# Patient Record
Sex: Male | Born: 1975 | Race: White | Hispanic: No | Marital: Married | Smoking: Never smoker
Health system: Southern US, Community
[De-identification: ages and names within clinical notes are randomized; demographics above are authoritative.]

## PROBLEM LIST (undated history)

## (undated) DIAGNOSIS — J189 Pneumonia, unspecified organism: Secondary | ICD-10-CM

## (undated) DIAGNOSIS — I10 Essential (primary) hypertension: Secondary | ICD-10-CM

## (undated) DIAGNOSIS — I509 Heart failure, unspecified: Secondary | ICD-10-CM

## (undated) DIAGNOSIS — K5790 Diverticulosis of intestine, part unspecified, without perforation or abscess without bleeding: Secondary | ICD-10-CM

## (undated) DIAGNOSIS — I429 Cardiomyopathy, unspecified: Secondary | ICD-10-CM

## (undated) DIAGNOSIS — K8020 Calculus of gallbladder without cholecystitis without obstruction: Secondary | ICD-10-CM

## (undated) DIAGNOSIS — G4733 Obstructive sleep apnea (adult) (pediatric): Secondary | ICD-10-CM

## (undated) DIAGNOSIS — I5022 Chronic systolic (congestive) heart failure: Secondary | ICD-10-CM

## (undated) DIAGNOSIS — R911 Solitary pulmonary nodule: Secondary | ICD-10-CM

## (undated) DIAGNOSIS — K760 Fatty (change of) liver, not elsewhere classified: Secondary | ICD-10-CM

## (undated) HISTORY — PX: NO PAST SURGERIES: SHX2092

---

## 2014-10-07 ENCOUNTER — Encounter (HOSPITAL_COMMUNITY): Payer: Self-pay | Admitting: Emergency Medicine

## 2014-10-07 ENCOUNTER — Emergency Department (HOSPITAL_COMMUNITY): Payer: Medicaid Other

## 2014-10-07 ENCOUNTER — Inpatient Hospital Stay (HOSPITAL_COMMUNITY)
Admission: EM | Admit: 2014-10-07 | Discharge: 2014-10-12 | DRG: 193 | Disposition: A | Discharge status: Home / Self Care | Payer: Medicaid Other | Attending: Family Medicine | Admitting: Family Medicine

## 2014-10-07 DIAGNOSIS — E1159 Type 2 diabetes mellitus with other circulatory complications: Secondary | ICD-10-CM | POA: Diagnosis present

## 2014-10-07 DIAGNOSIS — F17220 Nicotine dependence, chewing tobacco, uncomplicated: Secondary | ICD-10-CM | POA: Diagnosis present

## 2014-10-07 DIAGNOSIS — R05 Cough: Secondary | ICD-10-CM | POA: Diagnosis present

## 2014-10-07 DIAGNOSIS — J9601 Acute respiratory failure with hypoxia: Secondary | ICD-10-CM

## 2014-10-07 DIAGNOSIS — I5023 Acute on chronic systolic (congestive) heart failure: Secondary | ICD-10-CM | POA: Diagnosis present

## 2014-10-07 DIAGNOSIS — R042 Hemoptysis: Secondary | ICD-10-CM

## 2014-10-07 DIAGNOSIS — J189 Pneumonia, unspecified organism: Secondary | ICD-10-CM | POA: Diagnosis not present

## 2014-10-07 DIAGNOSIS — R0902 Hypoxemia: Secondary | ICD-10-CM

## 2014-10-07 DIAGNOSIS — E876 Hypokalemia: Secondary | ICD-10-CM | POA: Diagnosis present

## 2014-10-07 DIAGNOSIS — Z6841 Body Mass Index (BMI) 40.0 and over, adult: Secondary | ICD-10-CM | POA: Diagnosis not present

## 2014-10-07 DIAGNOSIS — D649 Anemia, unspecified: Secondary | ICD-10-CM | POA: Diagnosis present

## 2014-10-07 DIAGNOSIS — I429 Cardiomyopathy, unspecified: Secondary | ICD-10-CM | POA: Diagnosis present

## 2014-10-07 DIAGNOSIS — D509 Iron deficiency anemia, unspecified: Secondary | ICD-10-CM | POA: Diagnosis present

## 2014-10-07 DIAGNOSIS — R000 Tachycardia, unspecified: Secondary | ICD-10-CM | POA: Diagnosis present

## 2014-10-07 DIAGNOSIS — E669 Obesity, unspecified: Secondary | ICD-10-CM | POA: Diagnosis present

## 2014-10-07 DIAGNOSIS — I5021 Acute systolic (congestive) heart failure: Secondary | ICD-10-CM | POA: Diagnosis present

## 2014-10-07 DIAGNOSIS — R0602 Shortness of breath: Secondary | ICD-10-CM

## 2014-10-07 DIAGNOSIS — J9600 Acute respiratory failure, unspecified whether with hypoxia or hypercapnia: Secondary | ICD-10-CM | POA: Diagnosis not present

## 2014-10-07 DIAGNOSIS — I10 Essential (primary) hypertension: Secondary | ICD-10-CM | POA: Diagnosis present

## 2014-10-07 DIAGNOSIS — R059 Cough, unspecified: Secondary | ICD-10-CM

## 2014-10-07 HISTORY — DX: Pneumonia, unspecified organism: J18.9

## 2014-10-07 HISTORY — DX: Cardiomyopathy, unspecified: I42.9

## 2014-10-07 HISTORY — DX: Essential (primary) hypertension: I10

## 2014-10-07 LAB — CREATININE, SERUM
CREATININE: 1.03 mg/dL (ref 0.61–1.24)
GFR calc Af Amer: >60 mL/min (ref >60)

## 2014-10-07 LAB — CBC WITH DIFFERENTIAL/PLATELET
Basophils Absolute: 0 10*3/uL (ref 0.0–0.1)
Basophils Relative: 0 % (ref 0–1)
EOS PCT: 0 % (ref 0–5)
Eosinophils Absolute: 0 10*3/uL (ref 0.0–0.7)
HCT: 40.4 % (ref 39.0–52.0)
HEMOGLOBIN: 13.3 g/dL (ref 13.0–17.0)
LYMPHS PCT: 5 % — AB (ref 12–46)
Lymphs Abs: 0.6 10*3/uL — ABNORMAL LOW (ref 0.7–4.0)
MCH: 25.6 pg — ABNORMAL LOW (ref 26.0–34.0)
MCHC: 32.9 g/dL (ref 30.0–36.0)
MCV: 77.7 fL — ABNORMAL LOW (ref 78.0–100.0)
Monocytes Absolute: 0.4 10*3/uL (ref 0.1–1.0)
Monocytes Relative: 3 % (ref 3–12)
Neutro Abs: 13 10*3/uL — ABNORMAL HIGH (ref 1.7–7.7)
Neutrophils Relative %: 92 % — ABNORMAL HIGH (ref 43–77)
PLATELETS: 249 10*3/uL (ref 150–400)
RBC: 5.2 MIL/uL (ref 4.22–5.81)
RDW: 15 % (ref 11.5–15.5)
WBC: 14 10*3/uL — AB (ref 4.0–10.5)

## 2014-10-07 LAB — CBC
HEMATOCRIT: 38.5 % — AB (ref 39.0–52.0)
HEMOGLOBIN: 12.5 g/dL — AB (ref 13.0–17.0)
MCH: 25.3 pg — ABNORMAL LOW (ref 26.0–34.0)
MCHC: 32.5 g/dL (ref 30.0–36.0)
MCV: 77.8 fL — ABNORMAL LOW (ref 78.0–100.0)
Platelets: 255 10*3/uL (ref 150–400)
RBC: 4.95 MIL/uL (ref 4.22–5.81)
RDW: 15.2 % (ref 11.5–15.5)
WBC: 12.9 10*3/uL — ABNORMAL HIGH (ref 4.0–10.5)

## 2014-10-07 LAB — COMPREHENSIVE METABOLIC PANEL
ALBUMIN: 3.7 g/dL (ref 3.5–5.0)
ALT: 32 U/L (ref 17–63)
AST: 24 U/L (ref 15–41)
Alkaline Phosphatase: 60 U/L (ref 38–126)
Anion gap: 10 (ref 5–15)
BUN: 14 mg/dL (ref 6–20)
CALCIUM: 8.6 mg/dL — AB (ref 8.9–10.3)
CHLORIDE: 103 mmol/L (ref 101–111)
CO2: 24 mmol/L (ref 22–32)
Creatinine, Ser: 0.98 mg/dL (ref 0.61–1.24)
GFR calc Af Amer: >60 mL/min (ref >60)
Glucose, Bld: 101 mg/dL — ABNORMAL HIGH (ref 65–99)
POTASSIUM: 3 mmol/L — AB (ref 3.5–5.1)
Sodium: 137 mmol/L (ref 135–145)
Total Bilirubin: 1.2 mg/dL (ref 0.3–1.2)
Total Protein: 7.2 g/dL (ref 6.5–8.1)

## 2014-10-07 LAB — BRAIN NATRIURETIC PEPTIDE: B Natriuretic Peptide: 204 pg/mL — ABNORMAL HIGH (ref 0.0–100.0)

## 2014-10-07 LAB — CG4 I-STAT (LACTIC ACID): Lactic Acid, Venous: 0.84 mmol/L (ref 0.5–2.0)

## 2014-10-07 LAB — I-STAT CG4 LACTIC ACID, ED: Lactic Acid, Venous: 0.79 mmol/L (ref 0.5–2.0)

## 2014-10-07 MED ORDER — POTASSIUM CHLORIDE CRYS ER 20 MEQ PO TBCR
40.0000 meq | EXTENDED_RELEASE_TABLET | Freq: Once | ORAL | Status: AC
  Administered 2014-10-07: 40 meq via ORAL
  Filled 2014-10-07: qty 2

## 2014-10-07 MED ORDER — SODIUM CHLORIDE 0.9 % IV BOLUS (SEPSIS)
1000.0000 mL | Freq: Once | INTRAVENOUS | Status: AC
  Administered 2014-10-07: 1000 mL via INTRAVENOUS

## 2014-10-07 MED ORDER — OXYCODONE-ACETAMINOPHEN 5-325 MG PO TABS
2.0000 | ORAL_TABLET | Freq: Once | ORAL | Status: AC
  Administered 2014-10-07: 2 via ORAL
  Filled 2014-10-07: qty 2

## 2014-10-07 MED ORDER — VANCOMYCIN HCL 10 G IV SOLR
1500.0000 mg | Freq: Once | INTRAVENOUS | Status: DC
  Filled 2014-10-07: qty 1500

## 2014-10-07 MED ORDER — IPRATROPIUM-ALBUTEROL 0.5-2.5 (3) MG/3ML IN SOLN
3.0000 mL | RESPIRATORY_TRACT | Status: DC | PRN
  Administered 2014-10-07 – 2014-10-11 (×4): 3 mL via RESPIRATORY_TRACT
  Filled 2014-10-07 (×3): qty 3

## 2014-10-07 MED ORDER — ONDANSETRON HCL 4 MG/2ML IJ SOLN
4.0000 mg | Freq: Once | INTRAMUSCULAR | Status: AC
  Administered 2014-10-07: 4 mg via INTRAVENOUS
  Filled 2014-10-07: qty 2

## 2014-10-07 MED ORDER — IOHEXOL 350 MG/ML SOLN
100.0000 mL | Freq: Once | INTRAVENOUS | Status: AC | PRN
  Administered 2014-10-07: 100 mL via INTRAVENOUS

## 2014-10-07 MED ORDER — OXYCODONE-ACETAMINOPHEN 5-325 MG PO TABS
1.0000 | ORAL_TABLET | ORAL | Status: DC | PRN
Start: 2014-10-07 — End: 2014-10-09
  Administered 2014-10-08 – 2014-10-09 (×6): 1 via ORAL
  Filled 2014-10-07 (×6): qty 1

## 2014-10-07 MED ORDER — SODIUM CHLORIDE 0.9 % IV SOLN
1500.0000 mg | Freq: Once | INTRAVENOUS | Status: DC
  Filled 2014-10-07: qty 1500

## 2014-10-07 MED ORDER — VANCOMYCIN HCL IN DEXTROSE 1-5 GM/200ML-% IV SOLN: 1000.0000 mg | Freq: Once | INTRAVENOUS | Status: DC

## 2014-10-07 MED ORDER — DEXTROSE 5 % IV SOLN
1.0000 g | Freq: Once | INTRAVENOUS | Status: AC
  Administered 2014-10-07: 1 g via INTRAVENOUS
  Filled 2014-10-07: qty 10

## 2014-10-07 MED ORDER — DEXTROSE 5 % IV SOLN
500.0000 mg | Freq: Once | INTRAVENOUS | Status: AC
  Administered 2014-10-07: 500 mg via INTRAVENOUS
  Filled 2014-10-07: qty 500

## 2014-10-07 MED ORDER — DEXTROSE 5 % IV SOLN
500.0000 mg | INTRAVENOUS | Status: DC
  Administered 2014-10-08: 500 mg via INTRAVENOUS
  Filled 2014-10-07 (×2): qty 500

## 2014-10-07 MED ORDER — ZOLPIDEM TARTRATE 5 MG PO TABS
5.0000 mg | ORAL_TABLET | Freq: Every evening | ORAL | Status: DC | PRN
  Administered 2014-10-07 – 2014-10-10 (×2): 5 mg via ORAL
  Filled 2014-10-07 (×2): qty 1

## 2014-10-07 MED ORDER — SODIUM CHLORIDE 0.9 % IV SOLN
INTRAVENOUS | Status: DC
  Administered 2014-10-07 – 2014-10-09 (×4): via INTRAVENOUS

## 2014-10-07 MED ORDER — HEPARIN SODIUM (PORCINE) 5000 UNIT/ML IJ SOLN
5000.0000 [IU] | Freq: Three times a day (TID) | INTRAMUSCULAR | Status: DC
  Administered 2014-10-07 – 2014-10-12 (×14): 5000 [IU] via SUBCUTANEOUS
  Filled 2014-10-07 (×13): qty 1

## 2014-10-07 MED ORDER — IPRATROPIUM-ALBUTEROL 0.5-2.5 (3) MG/3ML IN SOLN
3.0000 mL | Freq: Once | RESPIRATORY_TRACT | Status: AC
  Administered 2014-10-07: 3 mL via RESPIRATORY_TRACT
  Filled 2014-10-07: qty 3

## 2014-10-07 MED ORDER — SODIUM CHLORIDE 0.9 % IV SOLN: INTRAVENOUS | Status: DC

## 2014-10-07 MED ORDER — DEXTROSE 5 % IV SOLN
1.0000 g | INTRAVENOUS | Status: DC
  Administered 2014-10-08 – 2014-10-11 (×4): 1 g via INTRAVENOUS
  Filled 2014-10-07 (×6): qty 10

## 2014-10-07 NOTE — ED Notes (Signed)
Hospitalist at bedside 

## 2014-10-07 NOTE — ED Notes (Signed)
Verified [facility]com[facility] with [facility]. [facility] reported okay to admin vancomycin.

## 2014-10-07 NOTE — ED Notes (Signed)
Pt reports cough and congestion x 1 1/2 months, worsening over the past week. Pt states he was up all last night coughing up blood, bright red.

## 2014-10-07 NOTE — ED Notes (Signed)
EDP at bedside  

## 2014-10-07 NOTE — Progress Notes (Signed)
Pt complaining of headache, toothache, and generalized pain. Notified MD (Dr. Clearence Ped). Awaiting response.

## 2014-10-07 NOTE — ED Provider Notes (Signed)
16:15- has asthma Dr. Deretha Emory to evaluate the patient after return of CT imaging, CT angiogram, chest. CT is abnormal and most consistent with pneumonia. The CT is abnormal, mostly consistent with pneumonia, but has a wide differential. Broad-spectrum antibodies ordered for community-acquired pneumonia. Oxygen status has been stabilized with nasal cannula oxygen. Hypokalemia, treated with oral potassium.  Assessment- symptoms, cough with hemoptysis and hypoxia, are most consistent with an infectious process. Patient works in a [employer], and is exposed to various chemicals and toxic products. He will require admission for stabilization.  4:26 PM-Consult complete with Hospitalist. Patient case explained and discussed. He agrees to admit patient for further evaluation and treatment. Call ended at 16:46  CRITICAL CARE Performed by: Flint Melter Total critical care time: 35 minutes Critical care time was exclusive of separately billable procedures and treating other patients. Critical care was necessary to treat or prevent imminent or life-threatening deterioration. Critical care was time spent personally by me on the following activities: development of treatment plan with patient and/or surrogate as well as nursing, discussions with consultants, evaluation of patient's response to treatment, examination of patient, obtaining history from patient or surrogate, ordering and performing treatments and interventions, ordering and review of laboratory studies, ordering and review of radiographic studies, pulse oximetry and re-evaluation of patient's condition.  Medications  0.9 %  sodium chloride infusion (not administered)  cefTRIAXone (ROCEPHIN) 1 g in dextrose 5 % 50 mL IVPB (not administered)  azithromycin (ZITHROMAX) 500 mg in dextrose 5 % 250 mL IVPB (not administered)  sodium chloride 0.9 % bolus 1,000 mL (0 mLs Intravenous Stopped 10/07/14 1534)  ondansetron (ZOFRAN) injection 4 mg  (4 mg Intravenous Given 10/07/14 1256)  ipratropium-albuterol (DUONEB) 0.5-2.5 (3) MG/3ML nebulizer solution 3 mL (3 mLs Nebulization Given 10/07/14 1329)  iohexol (OMNIPAQUE) 350 MG/ML injection 100 mL (100 mLs Intravenous Contrast Given 10/07/14 1455)  sodium chloride 0.9 % bolus 1,000 mL (1,000 mLs Intravenous New Bag/Given 10/07/14 1538)  potassium chloride SA (K-DUR,KLOR-CON) CR tablet 40 mEq (40 mEq Oral Given 10/07/14 1534)    Patient Vitals for the past 24 hrs:  BP Temp Temp src Pulse Resp SpO2 Height Weight  10/07/14 1615 - - - (!) 123 (!) 30 92 % - -  10/07/14 1600 138/92 mmHg - - (!) 122 (!) 37 91 % - -  10/07/14 1530 127/91 mmHg - - (!) 121 (!) 27 92 % - -  10/07/14 1500 154/96 mmHg - - (!) 124 (!) 29 92 % - -  10/07/14 1430 143/92 mmHg - - 119 (!) 43 93 % - -  10/07/14 1412 - - - - - - - (!) 355 lb (161.027 kg)  10/07/14 1400 144/96 mmHg - - (!) 122 22 92 % - -  10/07/14 1345 - - - (!) 121 (!) 33 94 % - -  10/07/14 1330 (!) 160/103 mmHg - - 118 21 100 % - -  10/07/14 1321 156/98 mmHg - - 118 (!) 28 94 % - -  10/07/14 1315 - - - 118 - 95 % - -  10/07/14 1300 (!) 166/108 mmHg - - - - - - -  10/07/14 1236 (!) 168/121 mmHg - - 120 23 92 % - -  10/07/14 1220 156/97 mmHg 99.4 F (37.4 C) Oral (!) 123 (!) 28 92 % 6\' 3"  (1.905 m) (!) 350 lb (158.759 kg)               Final diagnoses:  Cough  SOB (shortness  of breath)  Hemoptysis  Hypoxia  Community acquired pneumonia  Hypokalemia    Mancel Bale, MD 10/07/14 1649

## 2014-10-07 NOTE — H&P (Signed)
[facility] History and Physical  Cole Singleton ZOX:[id number] DOB: [birth year] DOA: 10/07/2014  Referring physician: ER PCP: No primary care provider on file.   Chief Complaint: Cough. Dyspnea.  HPI: Cole Singleton is a 39 y.o. male  This is a 39 year old morbidly obese man who presents with 3-4 week history of cough and congestion. He says that he does have allergies and he tends to cough and sneeze but in the last 24 hours he has been coughing up blood together with yellow tinged sputum. He denies any subjective fever. He says that his breathing is worse than usual. He is rather vague with what makes him short of breath. He denies any nausea, vomiting, abdominal pain, diarrhea, neurological symptoms. 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 Evaluation in the emergency room shows bilateral pneumonia. He is now being admitted for further management.   Review of Systems:  Apart from symptoms above, all systems negative.  Past Medical History  Diagnosis Date  . Cardiomyopathy   . Hypertension    History reviewed. No pertinent past surgical history. Social History:  reports that he has never smoked. His smokeless tobacco use includes Chew. He reports that he drinks alcohol. His drug history is not on file.  Allergies  Allergen Reactions  . Codeine Other (See Comments)     Family history: There is no family history of lung disease.   Prior to Admission medications   Medication Sig Start Date End Date Taking? Authorizing Provider  ibuprofen (ADVIL,MOTRIN) 200 MG tablet Take 800 mg by mouth every 6 (six) hours as needed for moderate pain.   Yes Historical Provider, MD  Pseudoeph-Doxylamine-DM-APAP (DAYQUIL/NYQUIL COLD/FLU RELIEF PO) Take 2 capsules by mouth every 4 (four) hours as needed (sinus congestion).   Yes Historical Provider, MD   Physical Exam: Filed Vitals:   10/07/14 1700 10/07/14 1715 10/07/14 1730 10/07/14 1751    BP: 137/90  141/86   Pulse: 121 121 120 120  Temp:      TempSrc:      Resp:  24  27  Height:      Weight:      SpO2: 91%  92% 93%    Wt Readings from Last 3 Encounters:  10/07/14 161.027 kg (355 lb)    General:  Appears calm and comfortable. Nontoxic. He appears to be somewhat dyspneic whilst he is talking to me. However, there is no peripheral or central cyanosis. He feels warm. Eyes: PERRL, normal lids, irises & conjunctiva ENT: grossly normal hearing, lips & tongue Neck: no LAD, masses or thyromegaly Cardiovascular: RRR, no m/r/g. No LE edema. Telemetry: SR, no arrhythmias  Respiratory: Scattered bilateral crackles. There is no bronchial breathing or wheezing today.. Abdomen: soft, ntnd Skin: no rash or induration seen on limited exam Musculoskeletal: grossly normal tone BUE/BLE Psychiatric: grossly normal mood and affect, speech fluent and appropriate Neurologic: grossly non-focal.          Labs on Admission:  Basic Metabolic Panel:  Recent Labs Lab 10/07/14 1248  NA 137  K 3.0*  CL 103  CO2 24  GLUCOSE 101*  BUN 14  CREATININE 0.98  CALCIUM 8.6*   Liver Function Tests:  Recent Labs Lab 10/07/14 1248  AST 24  ALT 32  ALKPHOS 60  BILITOT 1.2  PROT 7.2  ALBUMIN 3.7   No results for input(s): LIPASE, AMYLASE in the last 168 hours. No results for input(s): AMMONIA in the last 168 hours. CBC:  Recent  Labs Lab 10/07/14 1248  WBC 14.0*  NEUTROABS 13.0*  HGB 13.3  HCT 40.4  MCV 77.7*  PLT 249   Cardiac Enzymes: No results for input(s): CKTOTAL, CKMB, CKMBINDEX, TROPONINI in the last 168 hours.  BNP (last 3 results)  Recent Labs  10/07/14 1535  BNP 204.0*    ProBNP (last 3 results) No results for input(s): PROBNP in the last 8760 hours.  CBG: No results for input(s): GLUCAP in the last 168 hours.  Radiological Exams on Admission: Dg Chest 2 View  10/07/2014   CLINICAL DATA:  Cough and shortness of breath  EXAM: CHEST  2 VIEW   COMPARISON:  None.  FINDINGS: Mild enlargement of the cardiomediastinal silhouette is noted. Multi lobar patchy airspace opacities are noted with central vascular congestion. No pleural effusion. No acute osseous finding.  IMPRESSION: Cardiomegaly with nonspecific patchy multi lobar airspace opacities. This could represent asymmetric edema but other alveolar filling processes including hemorrhage, infection, pus, or protein could appear similar.   Electronically Signed   By: Christiana Pellant M.D.   On: 10/07/2014 13:35   Ct Angio Chest Pe W/cm &/or Wo Cm  10/07/2014   CLINICAL DATA:  Shortness of breath and hemoptysis. History of cardiomyopathy.  EXAM: CT ANGIOGRAPHY CHEST WITH CONTRAST  TECHNIQUE: Multidetector CT imaging of the chest was performed using the standard protocol during bolus administration of intravenous contrast. Multiplanar CT image reconstructions and MIPs were obtained to evaluate the vascular anatomy.  CONTRAST:  OMNIPAQUE IOHEXOL 350 MG/ML SOLN  COMPARISON:  Chest radiograph October 07, 2014  FINDINGS: There is no demonstrable pulmonary embolus. There is no thoracic aortic aneurysm or dissection.  There is extensive airspace consolidation throughout both lungs with essentially equivalent upper and lower lobe prominence. There is no appreciable interstitial edema. There is slight lower lobe bronchiectasis bilaterally.  Thyroid appears normal. There are small mediastinal lymph nodes but no adenopathy by size criteria. The pericardium is not thickened.  In the visualized upper abdomen, there is hepatic steatosis. Both kidneys show fetal lobulations, an anatomic variant.  There are no blastic or lytic bone lesions.  Review of the MIP images confirms the above findings.  IMPRESSION: No demonstrable pulmonary embolus.  Widespread airspace opacity bilaterally. Major differential considerations include extensive infectious pneumonia, pulmonary hemorrhage, or extensive noncardiogenic edema such as  due to toxin exposure or medication reaction. The appearance is atypical for potential congestive heart failure.  Multiple small mediastinal lymph nodes but no frank adenopathy by size criteria.  There is hepatic steatosis.   Electronically Signed   By: Bretta Bang III M.D.   On: 10/07/2014 16:04      Assessment/Plan   1. Community-acquired pneumonia. He will be treated with anti-biotics. He'll be given supplemental oxygen as required. In view of his very abnormal chest x-ray and CT scan, I will ask pulmonary for further recommendations. He tells me that in 2009 he had pneumonia and apparently was diagnosed with cardiomyopathy at that time. He was treated with diuretics and was seeing a cardiologist, however it seems that his ejection fraction must have returned to her normal state because he was discharged from further appointments. I will check echocardiogram again on this admission. 2. Obesity.  Further recommendations will depend on patient's hospital progress.  Code Status: Full code.   DVT Prophylaxis: Heparin.  Family Communication: I discussed the plan with the patient at the bedside.   Disposition Plan: Home when medically stable.   Time spent: 60 minutes.  Cole Singleton  C [facility] Pager [phone].

## 2014-10-07 NOTE — Progress Notes (Signed)
ANTIBIOTIC CONSULT NOTE - INITIAL  Pharmacy Consult for Vancomycin Indication: pneumonia  Allergies  Allergen Reactions  . Codeine Other (See Comments)    Patient Measurements: Height: 6\' 3"  (190.5 cm) Weight: (!) 355 lb (161.027 kg) IBW/kg (Calculated) : 84.5  Vital Signs: Temp: 98.4 F (36.9 C) (06/19 1633) Temp Source: Oral (06/19 1633) BP: 144/93 mmHg (06/19 1630) Pulse Rate: 122 (06/19 1630) Intake/Output from previous day:   Intake/Output from this shift:    Labs:  Recent Labs  10/07/14 1248  WBC 14.0*  HGB 13.3  PLT 249  CREATININE 0.98   Estimated Creatinine Clearance: 164.8 mL/min (by C-G formula based on Cr of 0.98). No results for input(s): VANCOTROUGH, VANCOPEAK, VANCORANDOM, GENTTROUGH, GENTPEAK, GENTRANDOM, TOBRATROUGH, TOBRAPEAK, TOBRARND, AMIKACINPEAK, AMIKACINTROU, AMIKACIN in the last 72 hours.   Microbiology: Recent Results (from the past 720 hour(s))  Culture, blood (routine x 2)     Status: None (Preliminary result)   Collection Time: 10/07/14  2:54 PM  Result Value Ref Range Status   Specimen Description BLOOD LEFT ARM  Final   Special Requests BOTTLES DRAWN AEROBIC AND ANAEROBIC 8CC BOTTLES  Final   Culture PENDING  Incomplete   Report Status PENDING  Incomplete  Culture, blood (routine x 2)     Status: None (Preliminary result)   Collection Time: 10/07/14  2:59 PM  Result Value Ref Range Status   Specimen Description BLOOD RIGHT HAND  Final   Special Requests BOTTLES DRAWN AEROBIC ONLY 8CC BOTTLE  Final   Culture PENDING  Incomplete   Report Status PENDING  Incomplete    Medical History: Past Medical History  Diagnosis Date  . Cardiomyopathy   . Hypertension    Anti-infectives    Start     Dose/Rate Route Frequency Ordered Stop   10/07/14 1900  vancomycin (VANCOCIN) IVPB 1000 mg/200 mL premix     1,000 mg 200 mL/hr over 60 Minutes Intravenous  Once 10/07/14 1647     10/07/14 1700  vancomycin (VANCOCIN) 1,500 mg in sodium  chloride 0.9 % 500 mL IVPB  Status:  Discontinued     1,500 mg 250 mL/hr over 120 Minutes Intravenous  Once 10/07/14 1634 10/07/14 1643   10/07/14 1700  vancomycin (VANCOCIN) 1,500 mg in sodium chloride 0.9 % 500 mL IVPB     1,500 mg 250 mL/hr over 120 Minutes Intravenous  Once 10/07/14 1647     10/07/14 1630  cefTRIAXone (ROCEPHIN) 1 g in dextrose 5 % 50 mL IVPB     1 g 100 mL/hr over 30 Minutes Intravenous  Once 10/07/14 1618     10/07/14 1630  azithromycin (ZITHROMAX) 500 mg in dextrose 5 % 250 mL IVPB     500 mg 250 mL/hr over 60 Minutes Intravenous  Once 10/07/14 1618        Assessment: 39yo MORBIDLY OBESE male presents to ED with c/o congestion and cough for ~ 6 weeks.  States he started experiencing hemoptysis last night.  Also c/o wheezing, sore throat and HA.  SCr at baseline.  Normalized ClCr ~ 100-140ml/min  Goal of Therapy:  Vancomycin trough level 15-20 mcg/ml  Plan:  Vancomycin 2500mg  loading dose now x 1 then Vancomycin 1500mg  IV q8hrs Check trough at steady state Monitor labs, renal fxn, progress and c/s Deescalate ABX when appropriate  Valrie Hart A 10/07/2014,4:50 PM

## 2014-10-07 NOTE — ED Notes (Addendum)
Lab at bedside. Pt will be taken to room 330 post lab draw.

## 2014-10-07 NOTE — ED Notes (Signed)
Pt 02 saturation on room air 88-925. Pt placed on 2lites and o2 satuation 93%. nad noted.

## 2014-10-07 NOTE — ED Notes (Signed)
RT called

## 2014-10-07 NOTE — ED Provider Notes (Addendum)
CSN: [id number]     Arrival date & time 10/07/14  1211 History  This chart was scribed for Vanetta Mulders, MD by Karle Plumber, ED Scribe. This patient was seen in room APA06/[facility]06 and the patient's care was started at 12:41 PM.  Chief Complaint  Patient presents with  . Shortness of Breath   The history is provided by the patient and medical records. No language interpreter was used.    HPI Comments:  Cole Singleton is a 39 y.o. male who presents to the Emergency Department complaining of congestion and cough that began approximately six weeks ago. He states he started experiencing hemoptysis (less than 1 cup) last night. He also reports associated wheezing, mild HA and mild sore throat. He has not done anything to treat his symptoms. He reports having similar symptoms to this every year but states it is worse this time. He denies modifying factors of the symptoms. He denies fever, chills, rhinorrhea, visual disturbance, CP, leg swelling, abdominal pain, diarrhea, nausea, vomiting, dysuria, hematuria, back pain or rash.   Past Medical History  Diagnosis Date  . Cardiomyopathy   . Hypertension    History reviewed. No pertinent past surgical history. History reviewed. No pertinent family history. History  Substance Use Topics  . Smoking status: Never Smoker   . Smokeless tobacco: Current User    Types: Chew  . Alcohol Use: Yes     Comment: occas    Review of Systems  Constitutional: Negative for fever and chills.  HENT: Positive for congestion and sore throat. Negative for rhinorrhea.   Eyes: Negative for visual disturbance.  Respiratory: Positive for cough, shortness of breath and wheezing.   Cardiovascular: Negative for chest pain and leg swelling.  Gastrointestinal: Negative for nausea, vomiting, abdominal pain and diarrhea.  Genitourinary: Negative for dysuria and hematuria.  Musculoskeletal: Negative for back pain and neck pain.  Skin: Negative for rash.  Neurological:  Positive for headaches. Negative for dizziness and light-headedness.  Hematological: Does not bruise/bleed easily.  Psychiatric/Behavioral: Negative for confusion.    Allergies  Codeine  Home Medications   Prior to Admission medications   Medication Sig Start Date End Date Taking? Authorizing Provider  ibuprofen (ADVIL,MOTRIN) 200 MG tablet Take 800 mg by mouth every 6 (six) hours as needed for moderate pain.   Yes Historical Provider, MD  Pseudoeph-Doxylamine-DM-APAP (DAYQUIL/NYQUIL COLD/FLU RELIEF PO) Take 2 capsules by mouth every 4 (four) hours as needed (sinus congestion).   Yes Historical Provider, MD   Triage Vitals: BP 156/97 mmHg  Pulse 123  Temp(Src) 99.4 F (37.4 C) (Oral)  Resp 28  Ht  (1.905 m)  Wt 350 lb (158.759 kg)  BMI 43.75 kg/m2  SpO2 92% Physical Exam  Constitutional: He is oriented to person, place, and time. He appears well-developed and well-nourished.  HENT:  Head: Normocephalic and atraumatic.  Mouth/Throat: Oropharynx is clear and moist.  No blood present in back of throat.  Eyes: EOM are normal. Pupils are equal, round, and reactive to light. No scleral icterus.  Neck: Normal range of motion.  Cardiovascular: Tachycardia present.   No murmur heard. Pulmonary/Chest: Effort normal and breath sounds normal. No respiratory distress.  Abdominal: Soft. Bowel sounds are normal. There is no tenderness.  Musculoskeletal: Normal range of motion. He exhibits no edema.  Neurological: He is alert and oriented to person, place, and time. No cranial nerve deficit. He exhibits normal muscle tone. Coordination normal.  Skin: Skin is warm and dry.  Psychiatric: He has a  normal mood and affect. His behavior is normal.  Nursing note and vitals reviewed.   ED Course  Procedures (including critical care time) DIAGNOSTIC STUDIES: Oxygen Saturation is 92% on RA, low by my interpretation.   COORDINATION OF CARE: 12:45 PM- Will order labs, CXR and Zofran for  nausea. Pt verbalizes understanding and agrees to plan.  Medications  0.9 %  sodium chloride infusion (not administered)  iohexol (OMNIPAQUE) 350 MG/ML injection 100 mL (not administered)  sodium chloride 0.9 % bolus 1,000 mL (1,000 mLs Intravenous New Bag/Given 10/07/14 1256)  ondansetron (ZOFRAN) injection 4 mg (4 mg Intravenous Given 10/07/14 1256)  ipratropium-albuterol (DUONEB) 0.5-2.5 (3) MG/3ML nebulizer solution 3 mL (3 mLs Nebulization Given 10/07/14 1329)    Labs Review Labs Reviewed  COMPREHENSIVE METABOLIC PANEL - Abnormal; Notable for the following:    Potassium 3.0 (*)    Glucose, Bld 101 (*)    Calcium 8.6 (*)    All other components within normal limits  CBC WITH DIFFERENTIAL/PLATELET - Abnormal; Notable for the following:    WBC 14.0 (*)    MCV 77.7 (*)    MCH 25.6 (*)    Neutrophils Relative % 92 (*)    Neutro Abs 13.0 (*)    Lymphocytes Relative 5 (*)    Lymphs Abs 0.6 (*)    All other components within normal limits   Results for orders placed or performed during the hospital encounter of 10/07/14  Comprehensive metabolic panel  Result Value Ref Range   Sodium 137 135 - 145 mmol/L   Potassium 3.0 (L) 3.5 - 5.1 mmol/L   Chloride 103 101 - 111 mmol/L   CO2 24 22 - 32 mmol/L   Glucose, Bld 101 (H) 65 - 99 mg/dL   BUN 14 6 - 20 mg/dL   Creatinine, Ser 0.92 0.61 - 1.24 mg/dL   Calcium 8.6 (L) 8.9 - 10.3 mg/dL   Total Protein 7.2 6.5 - 8.1 g/dL   Albumin 3.7 3.5 - 5.0 g/dL   AST 24 15 - 41 U/L   ALT 32 17 - 63 U/L   Alkaline Phosphatase 60 38 - 126 U/L   Total Bilirubin 1.2 0.3 - 1.2 mg/dL   GFR calc non Af Amer >60 >60 mL/min   GFR calc Af Amer >60 >60 mL/min   Anion gap 10 5 - 15  CBC with Differential/Platelet  Result Value Ref Range   WBC 14.0 (H) 4.0 - 10.5 K/uL   RBC 5.20 4.22 - 5.81 MIL/uL   Hemoglobin 13.3 13.0 - 17.0 g/dL   HCT 95.7 47.3 - 40.3 %   MCV 77.7 (L) 78.0 - 100.0 fL   MCH 25.6 (L) 26.0 - 34.0 pg   MCHC 32.9 30.0 - 36.0 g/dL   RDW  70.9 64.3 - 83.8 %   Platelets 249 150 - 400 K/uL   Neutrophils Relative % 92 (H) 43 - 77 %   Neutro Abs 13.0 (H) 1.7 - 7.7 K/uL   Lymphocytes Relative 5 (L) 12 - 46 %   Lymphs Abs 0.6 (L) 0.7 - 4.0 K/uL   Monocytes Relative 3 3 - 12 %   Monocytes Absolute 0.4 0.1 - 1.0 K/uL   Eosinophils Relative 0 0 - 5 %   Eosinophils Absolute 0.0 0.0 - 0.7 K/uL   Basophils Relative 0 0 - 1 %   Basophils Absolute 0.0 0.0 - 0.1 K/uL     Imaging Review Dg Chest 2 View  10/07/2014   CLINICAL DATA:  Cough and shortness of breath  EXAM: CHEST  2 VIEW  COMPARISON:  None.  FINDINGS: Mild enlargement of the cardiomediastinal silhouette is noted. Multi lobar patchy airspace opacities are noted with central vascular congestion. No pleural effusion. No acute osseous finding.  IMPRESSION: Cardiomegaly with nonspecific patchy multi lobar airspace opacities. This could represent asymmetric edema but other alveolar filling processes including hemorrhage, infection, pus, or protein could appear similar.   Electronically Signed   By: Christiana Pellant M.D.   On: 10/07/2014 13:35     EKG Interpretation   Date/Time:  Sunday October 07 2014 12:31:25 EDT Ventricular Rate:  127 PR Interval:  142 QRS Duration: 94 QT Interval:  350 QTC Calculation: 509 R Axis:   69 Text Interpretation:  Sinus tachycardia Probable LVH with secondary repol  abnrm Prolonged QT interval No previous ECGs available Confirmed by  Hensley Aziz  MD, Azizah Lisle [phone]) on 10/07/2014 12:47:56 PM      MDM   Final diagnoses:  Cough  SOB (shortness of breath)  Hemoptysis    Patient with the cough and congestion for about a month and a half. Cut worse over the past week. Patient started coughing up blood last night less than a cup in total. Patient felt some wheezing associated with it.  Patient treated here with the albuterol Atrovent neb and patient feeling somewhat better. Patient's oxygen saturations have been in the low 90s. One point very  borderline so started on 2 L of nasal cannula oxygen. Patient with persistent tachycardia. No fever. Hemoglobin and hematocrit is normal. No significant blood loss.  Chest x-ray raises several concerns. There is cardiomegaly of patient's had a history of that. Is not cystic patchy multilobar airspace opacities. This could represent edema could represent alveolar filling process including hemorrhage and infection pus or protein. Patients undergo CT and geode to further delineate these findings. Also by doing a CT angiogram rule out pulmonary embolus since he is having hemoptysis and tachycardia and some hypoxia.  If patient's hypoxia does not improve. At the lowest was 88% the tachycardia does not improve he will require admission. Patient does not have a current primary care doctor.  I personally performed the services described in this documentation, which was scribed in my presence. The recorded information has been reviewed and is accurate.    Vanetta Mulders, MD 10/07/14 1453  Addendum: Due to patient's  persistent symptoms we'll go and get blood cultures lactic acid and because of the cardiomegaly a BNP. EKG has been done is consistent with sinus tachycardia. As well as prolonged QT interval.  Vanetta Mulders, MD 10/07/14 1455

## 2014-10-08 ENCOUNTER — Encounter (HOSPITAL_COMMUNITY): Payer: Self-pay | Admitting: Internal Medicine

## 2014-10-08 ENCOUNTER — Inpatient Hospital Stay (HOSPITAL_COMMUNITY): Payer: Medicaid Other

## 2014-10-08 DIAGNOSIS — R042 Hemoptysis: Secondary | ICD-10-CM | POA: Diagnosis present

## 2014-10-08 DIAGNOSIS — I10 Essential (primary) hypertension: Secondary | ICD-10-CM | POA: Diagnosis present

## 2014-10-08 DIAGNOSIS — J189 Pneumonia, unspecified organism: Secondary | ICD-10-CM | POA: Diagnosis present

## 2014-10-08 DIAGNOSIS — E1159 Type 2 diabetes mellitus with other circulatory complications: Secondary | ICD-10-CM | POA: Diagnosis present

## 2014-10-08 DIAGNOSIS — R05 Cough: Secondary | ICD-10-CM | POA: Diagnosis present

## 2014-10-08 DIAGNOSIS — R000 Tachycardia, unspecified: Secondary | ICD-10-CM | POA: Diagnosis present

## 2014-10-08 DIAGNOSIS — R059 Cough, unspecified: Secondary | ICD-10-CM | POA: Diagnosis present

## 2014-10-08 LAB — CBC
HEMATOCRIT: 37.6 % — AB (ref 39.0–52.0)
Hemoglobin: 12.1 g/dL — ABNORMAL LOW (ref 13.0–17.0)
MCH: 25.4 pg — ABNORMAL LOW (ref 26.0–34.0)
MCHC: 32.2 g/dL (ref 30.0–36.0)
MCV: 79 fL (ref 78.0–100.0)
PLATELETS: 230 10*3/uL (ref 150–400)
RBC: 4.76 MIL/uL (ref 4.22–5.81)
RDW: 15.7 % — ABNORMAL HIGH (ref 11.5–15.5)
WBC: 10.9 10*3/uL — ABNORMAL HIGH (ref 4.0–10.5)

## 2014-10-08 LAB — COMPREHENSIVE METABOLIC PANEL
ALBUMIN: 3.3 g/dL — AB (ref 3.5–5.0)
ALT: 31 U/L (ref 17–63)
AST: 24 U/L (ref 15–41)
Alkaline Phosphatase: 51 U/L (ref 38–126)
Anion gap: 9 (ref 5–15)
BUN: 14 mg/dL (ref 6–20)
CO2: 24 mmol/L (ref 22–32)
CREATININE: 1.01 mg/dL (ref 0.61–1.24)
Calcium: 8 mg/dL — ABNORMAL LOW (ref 8.9–10.3)
Chloride: 105 mmol/L (ref 101–111)
GFR calc non Af Amer: >60 mL/min (ref >60)
Glucose, Bld: 97 mg/dL (ref 65–99)
Potassium: 3.3 mmol/L — ABNORMAL LOW (ref 3.5–5.1)
Sodium: 138 mmol/L (ref 135–145)
TOTAL PROTEIN: 6.7 g/dL (ref 6.5–8.1)
Total Bilirubin: 1.2 mg/dL (ref 0.3–1.2)

## 2014-10-08 LAB — GLUCOSE, CAPILLARY: Glucose-Capillary: 98 mg/dL (ref 65–99)

## 2014-10-08 LAB — STREP PNEUMONIAE URINARY ANTIGEN: Strep Pneumo Urinary Antigen: NEGATIVE

## 2014-10-08 LAB — BRAIN NATRIURETIC PEPTIDE: B Natriuretic Peptide: 177 pg/mL — ABNORMAL HIGH (ref 0.0–100.0)

## 2014-10-08 LAB — SEDIMENTATION RATE: Sed Rate: 30 mm/hr — ABNORMAL HIGH (ref 0–16)

## 2014-10-08 LAB — MAGNESIUM: Magnesium: 1.8 mg/dL (ref 1.7–2.4)

## 2014-10-08 MED ORDER — IPRATROPIUM-ALBUTEROL 0.5-2.5 (3) MG/3ML IN SOLN
3.0000 mL | RESPIRATORY_TRACT | Status: DC
  Administered 2014-10-08 – 2014-10-11 (×15): 3 mL via RESPIRATORY_TRACT
  Filled 2014-10-08 (×16): qty 3

## 2014-10-08 MED ORDER — CARVEDILOL 3.125 MG PO TABS
3.1250 mg | ORAL_TABLET | Freq: Two times a day (BID) | ORAL | Status: DC
  Administered 2014-10-08 – 2014-10-12 (×8): 3.125 mg via ORAL
  Filled 2014-10-08 (×8): qty 1

## 2014-10-08 MED ORDER — VANCOMYCIN HCL 10 G IV SOLR
1500.0000 mg | Freq: Three times a day (TID) | INTRAVENOUS | Status: DC
  Filled 2014-10-08: qty 1500

## 2014-10-08 MED ORDER — POTASSIUM CHLORIDE CRYS ER 20 MEQ PO TBCR
40.0000 meq | EXTENDED_RELEASE_TABLET | Freq: Once | ORAL | Status: AC
  Administered 2014-10-08: 40 meq via ORAL
  Filled 2014-10-08: qty 2

## 2014-10-08 MED ORDER — GUAIFENESIN 100 MG/5ML PO SOLN
200.0000 mg | ORAL | Status: DC | PRN
  Administered 2014-10-08 – 2014-10-11 (×2): 200 mg via ORAL
  Filled 2014-10-08 (×2): qty 10

## 2014-10-08 NOTE — Progress Notes (Signed)
Nurse called and said the Pt was coughing a lot because he was laying flat and it was hard for him to breath. The Pt SATS were 79% on 2l [place]. RT gave Pt a breathing Tx and then placed him on a 50% VM. Pt's HR 133 and SATS came up to 92%.

## 2014-10-08 NOTE — Progress Notes (Signed)
[facility] PROGRESS NOTE  Cole Singleton EAV:[id number] DOB: 02-Sep-1975 DOA: 10/07/2014 PCP: No primary care provider on file.  Assessment/Plan: 1. Community-acquired pneumonia. Chest x-ray and CT scan abnormal with concern for pulmonary hemorrhage. Patient reports hx of same in past. Await pulmonary  for further recommendations. Will initiate autoimmune workup. Reportedly  diagnosed with cardiomyopathy 2009. He was treated with diuretics and was seeing a cardiologist (in [facility]) however it seems that his ejection fraction must have returned to her normal state because he was discharged from further appointments. BNp 204 on admission. Await echo results. He remains afebrile and non-toxic appearing. Leukocytosis improving.  Strep pneumoniae and legionella in process.  Continue zosyn and rocephin day #2.  2. Obesity: BMI 43.8. Nutritional consult 3. Hypokalemia: mild. Will replete. Check mag level 4. Tachycardia: reports hx of same. No events on tele. Will start coreg and monitor 5. HTN: reports being on lisinopril and carvilolol in past. Will start coreg. monitor   Code Status: full Family Communication: patient only Disposition Plan: home when ready   Consultants: pulmonary  Procedures:  none  Antibiotics:  Zosyn 10/07/14>>  Rocephin 19/16>>  HPI/Subjective: Sitting in chair. Reports feeling "some better". Denies pain/discomfort  Objective: Filed Vitals:   10/08/14 0536  BP: 140/85  Pulse: 110  Temp: 98.6 F (37 C)  Resp: 24    Intake/Output Summary (Last 24 hours) at 10/08/14 1059 Last data filed at 10/08/14 0858  Gross per 24 hour  Intake   1240 ml  Output    700 ml  Net    540 ml   Filed Weights   10/07/14 1220 10/07/14 1412  Weight: 158.759 kg (350 lb) 161.027 kg (355 lb)    Exam:   General:  Obese appears well  Cardiovascular: tachycardic but regular no MGR HS distant  Respiratory: normal effort fair air movement fine crackles no  wheeze  Abdomen: obese soft +BS  Musculoskeletal: no clubbing or cyanosis   Data Reviewed: Basic Metabolic Panel:  Recent Labs Lab 10/07/14 1248 10/07/14 1802 10/08/14 0508  NA 137  --  138  K 3.0*  --  3.3*  CL 103  --  105  CO2 24  --  24  GLUCOSE 101*  --  97  BUN 14  --  14  CREATININE 0.98 1.03 1.01  CALCIUM 8.6*  --  8.0*  MG  --   --  1.8   Liver Function Tests:  Recent Labs Lab 10/07/14 1248 10/08/14 0508  AST 24 24  ALT 32 31  ALKPHOS 60 51  BILITOT 1.2 1.2  PROT 7.2 6.7  ALBUMIN 3.7 3.3*   No results for input(s): LIPASE, AMYLASE in the last 168 hours. No results for input(s): AMMONIA in the last 168 hours. CBC:  Recent Labs Lab 10/07/14 1248 10/07/14 1802 10/08/14 0508  WBC 14.0* 12.9* 10.9*  NEUTROABS 13.0*  --   --   HGB 13.3 12.5* 12.1*  HCT 40.4 38.5* 37.6*  MCV 77.7* 77.8* 79.0  PLT 249 255 230   Cardiac Enzymes: No results for input(s): CKTOTAL, CKMB, CKMBINDEX, TROPONINI in the last 168 hours. BNP (last 3 results)  Recent Labs  10/07/14 1535  BNP 204.0*    ProBNP (last 3 results) No results for input(s): PROBNP in the last 8760 hours.  CBG:  Recent Labs Lab 10/08/14 0745  GLUCAP 98    Recent Results (from the past 240 hour(s))  Culture, blood (routine x 2)     Status: None (Preliminary  result)   Collection Time: 10/07/14  2:54 PM  Result Value Ref Range Status   Specimen Description BLOOD LEFT ARM  Final   Special Requests BOTTLES DRAWN AEROBIC AND ANAEROBIC 8CC BOTTLES  Final   Culture PENDING  Incomplete   Report Status PENDING  Incomplete  Culture, blood (routine x 2)     Status: None (Preliminary result)   Collection Time: 10/07/14  2:59 PM  Result Value Ref Range Status   Specimen Description BLOOD RIGHT HAND  Final   Special Requests BOTTLES DRAWN AEROBIC ONLY 8CC BOTTLE  Final   Culture PENDING  Incomplete   Report Status PENDING  Incomplete  Culture, blood (routine x 2) Call MD if unable to obtain prior  to antibiotics being given     Status: None (Preliminary result)   Collection Time: 10/07/14  6:02 PM  Result Value Ref Range Status   Specimen Description LEFT ANTECUBITAL  Final   Special Requests BOTTLES DRAWN AEROBIC AND ANAEROBIC 8CC  Final   Culture PENDING  Incomplete   Report Status PENDING  Incomplete  Culture, blood (routine x 2) Call MD if unable to obtain prior to antibiotics being given     Status: None (Preliminary result)   Collection Time: 10/07/14  6:10 PM  Result Value Ref Range Status   Specimen Description BLOOD LEFT HAND  Final   Special Requests BOTTLES DRAWN AEROBIC AND ANAEROBIC [facility]  Final   Culture PENDING  Incomplete   Report Status PENDING  Incomplete     Studies: Dg Chest 2 View  10/07/2014   CLINICAL DATA:  Cough and shortness of breath  EXAM: CHEST  2 VIEW  COMPARISON:  None.  FINDINGS: Mild enlargement of the cardiomediastinal silhouette is noted. Multi lobar patchy airspace opacities are noted with central vascular congestion. No pleural effusion. No acute osseous finding.  IMPRESSION: Cardiomegaly with nonspecific patchy multi lobar airspace opacities. This could represent asymmetric edema but other alveolar filling processes including hemorrhage, infection, pus, or protein could appear similar.   Electronically Signed   By: Christiana Pellant M.D.   On: 10/07/2014 13:35   Ct Angio Chest Pe W/cm &/or Wo Cm  10/07/2014   CLINICAL DATA:  Shortness of breath and hemoptysis. History of cardiomyopathy.  EXAM: CT ANGIOGRAPHY CHEST WITH CONTRAST  TECHNIQUE: Multidetector CT imaging of the chest was performed using the standard protocol during bolus administration of intravenous contrast. Multiplanar CT image reconstructions and MIPs were obtained to evaluate the vascular anatomy.  CONTRAST:  OMNIPAQUE IOHEXOL 350 MG/ML SOLN  COMPARISON:  Chest radiograph October 07, 2014  FINDINGS: There is no demonstrable pulmonary embolus. There is no thoracic aortic aneurysm or  dissection.  There is extensive airspace consolidation throughout both lungs with essentially equivalent upper and lower lobe prominence. There is no appreciable interstitial edema. There is slight lower lobe bronchiectasis bilaterally.  Thyroid appears normal. There are small mediastinal lymph nodes but no adenopathy by size criteria. The pericardium is not thickened.  In the visualized upper abdomen, there is hepatic steatosis. Both kidneys show fetal lobulations, an anatomic variant.  There are no blastic or lytic bone lesions.  Review of the MIP images confirms the above findings.  IMPRESSION: No demonstrable pulmonary embolus.  Widespread airspace opacity bilaterally. Major differential considerations include extensive infectious pneumonia, pulmonary hemorrhage, or extensive noncardiogenic edema such as due to toxin exposure or medication reaction. The appearance is atypical for potential congestive heart failure.  Multiple small mediastinal lymph nodes but no frank  adenopathy by size criteria.  There is hepatic steatosis.   Electronically Signed   By: Bretta Bang III M.D.   On: 10/07/2014 16:04    Scheduled Meds: . azithromycin  500 mg Intravenous Q24H  . cefTRIAXone (ROCEPHIN)  IV  1 g Intravenous Q24H  . heparin  5,000 Units Subcutaneous 3 times per day   Continuous Infusions: . sodium chloride 100 mL/hr at 10/08/14 6045    Active Problems:   CAP (community acquired pneumonia)   Obesity (BMI 35.0-39.9 without comorbidity)   Hypertension   Tachycardia    Time spent: 35 minutes    [facility] M  [facility] Pager [phone]. If 7PM-7AM, please contact night-coverage at [email or web address], password [facility] 10/08/2014, 10:59 AM  LOS: 1 day

## 2014-10-08 NOTE — Consult Note (Signed)
Full note to follow. Based on his history of previous pneumonia I think this is probably pneumonia with hemoptysis associated with it but it could be pulmonary hemorrhage. I told him I thought we should treat him empirically for pneumonia and see how he responds clinically. Discussed with the patient and his family. There may be an element of heart failure also considering his cardiomyopathy and depressed ejection fraction

## 2014-10-08 NOTE — Progress Notes (Signed)
UR chart review completed.  

## 2014-10-08 NOTE — Care Management Note (Signed)
Case Management Note  Patient Details  Name: Cole Singleton MRN: [id number] Date of Birth: 1975/07/22  Subjective/Objective:                  Pt admitted from home with pneumonia, Pt lives with his wife and will return home at discharge. Pt is independent with ADL's. Pt is currently employed.  Action/Plan: WIll arrange PCP follow up at discharge. May need MATCH voucher at discharge.  Expected Discharge Date:                  Expected Discharge Plan:  Home/Self Care  In-House Referral:  Financial Counselor  Discharge planning Services  Follow-up appt scheduled, CM Consult, MATCH Program  Post Acute Care Choice:  NA Choice offered to:  NA  DME Arranged:    DME Agency:     HH Arranged:    HH Agency:     Status of Service:  In process, will continue to follow  Medicare Important Message Given:    Date Medicare IM Given:    Medicare IM give by:    Date Additional Medicare IM Given:    Additional Medicare Important Message give by:     If discussed at Long Length of Stay Meetings, dates discussed:    Additional Comments:  Cheryl Flash, RN 10/08/2014, 3:24 PM

## 2014-10-09 DIAGNOSIS — J189 Pneumonia, unspecified organism: Principal | ICD-10-CM

## 2014-10-09 DIAGNOSIS — R042 Hemoptysis: Secondary | ICD-10-CM

## 2014-10-09 DIAGNOSIS — I10 Essential (primary) hypertension: Secondary | ICD-10-CM

## 2014-10-09 DIAGNOSIS — R05 Cough: Secondary | ICD-10-CM

## 2014-10-09 DIAGNOSIS — I519 Heart disease, unspecified: Secondary | ICD-10-CM

## 2014-10-09 DIAGNOSIS — R000 Tachycardia, unspecified: Secondary | ICD-10-CM

## 2014-10-09 DIAGNOSIS — J9601 Acute respiratory failure with hypoxia: Secondary | ICD-10-CM

## 2014-10-09 DIAGNOSIS — I429 Cardiomyopathy, unspecified: Secondary | ICD-10-CM

## 2014-10-09 DIAGNOSIS — I5021 Acute systolic (congestive) heart failure: Secondary | ICD-10-CM | POA: Diagnosis present

## 2014-10-09 DIAGNOSIS — J9600 Acute respiratory failure, unspecified whether with hypoxia or hypercapnia: Secondary | ICD-10-CM | POA: Diagnosis not present

## 2014-10-09 LAB — CBC
HCT: 36.9 % — ABNORMAL LOW (ref 39.0–52.0)
Hemoglobin: 11.6 g/dL — ABNORMAL LOW (ref 13.0–17.0)
MCH: 25.1 pg — AB (ref 26.0–34.0)
MCHC: 31.4 g/dL (ref 30.0–36.0)
MCV: 79.7 fL (ref 78.0–100.0)
PLATELETS: 209 10*3/uL (ref 150–400)
RBC: 4.63 MIL/uL (ref 4.22–5.81)
RDW: 15.6 % — ABNORMAL HIGH (ref 11.5–15.5)
WBC: 12.4 10*3/uL — ABNORMAL HIGH (ref 4.0–10.5)

## 2014-10-09 LAB — BASIC METABOLIC PANEL
Anion gap: 8 (ref 5–15)
BUN: 13 mg/dL (ref 6–20)
CALCIUM: 8.2 mg/dL — AB (ref 8.9–10.3)
CO2: 26 mmol/L (ref 22–32)
Chloride: 102 mmol/L (ref 101–111)
Creatinine, Ser: 0.89 mg/dL (ref 0.61–1.24)
GLUCOSE: 109 mg/dL — AB (ref 65–99)
Potassium: 3.4 mmol/L — ABNORMAL LOW (ref 3.5–5.1)
Sodium: 136 mmol/L (ref 135–145)

## 2014-10-09 LAB — MPO/PR-3 (ANCA) ANTIBODIES
ANCA Proteinase 3: <3.5 U/mL (ref 0.0–3.5)
Myeloperoxidase Abs: <9 U/mL (ref 0.0–9.0)

## 2014-10-09 LAB — C3 COMPLEMENT: C3 Complement: 143 mg/dL (ref 82–167)

## 2014-10-09 LAB — LEGIONELLA ANTIGEN, URINE

## 2014-10-09 LAB — ANCA TITERS
Atypical P-ANCA titer: <1:20 {titer}
C-ANCA: <1:20 {titer}
P-ANCA: <1:20 {titer}

## 2014-10-09 LAB — HIV ANTIBODY (ROUTINE TESTING W REFLEX): HIV Screen 4th Generation wRfx: NONREACTIVE

## 2014-10-09 LAB — C4 COMPLEMENT: Complement C4, Body Fluid: 28 mg/dL (ref 14–44)

## 2014-10-09 MED ORDER — SACUBITRIL-VALSARTAN 24-26 MG PO TABS
1.0000 | ORAL_TABLET | Freq: Two times a day (BID) | ORAL | Status: DC
  Administered 2014-10-09 – 2014-10-12 (×6): 1 via ORAL
  Filled 2014-10-09 (×12): qty 1

## 2014-10-09 MED ORDER — OXYCODONE-ACETAMINOPHEN 5-325 MG PO TABS
1.0000 | ORAL_TABLET | Freq: Four times a day (QID) | ORAL | Status: DC | PRN
  Administered 2014-10-09 – 2014-10-12 (×7): 1 via ORAL
  Filled 2014-10-09 (×7): qty 1

## 2014-10-09 MED ORDER — POTASSIUM CHLORIDE CRYS ER 20 MEQ PO TBCR
40.0000 meq | EXTENDED_RELEASE_TABLET | Freq: Once | ORAL | Status: AC
  Administered 2014-10-09: 40 meq via ORAL
  Filled 2014-10-09: qty 2

## 2014-10-09 MED ORDER — FUROSEMIDE 10 MG/ML IJ SOLN
40.0000 mg | Freq: Two times a day (BID) | INTRAMUSCULAR | Status: DC
  Administered 2014-10-09 (×2): 40 mg via INTRAVENOUS
  Filled 2014-10-09 (×2): qty 4

## 2014-10-09 MED ORDER — AZITHROMYCIN 250 MG PO TABS
500.0000 mg | ORAL_TABLET | Freq: Every day | ORAL | Status: DC
  Administered 2014-10-09 – 2014-10-11 (×3): 500 mg via ORAL
  Filled 2014-10-09 (×3): qty 2

## 2014-10-09 NOTE — Progress Notes (Signed)
[facility] PROGRESS NOTE  Cole Singleton RPR:[id number] DOB: December 08, 1975 DOA: 10/07/2014 PCP: No primary care provider on file.  Assessment/Plan:  Community-acquired pneumonia. Chest x-ray and CT scan abnormal with concern for pulmonary hemorrhage vs edema vs pna. Echo reveals EF 20-25%. Hemoptysis resolved. Evaluated by pulmonology who opines cardiomyopathy associated with pneumonia typically related to viral pneumonia but recommended continuation of antibiotics. Strep pneumoniae urine antigen negative. legionealla urine antigen pending. Leukocytosis trending up remains afebrile and non-toxic appearing. Continue zosyn and rocephin day #3  Acute systolic HF. Echo with EF 20%, left ventricle cavity severely dilated. Will start IV lasix. Monitor intake and output and obtain daily weights. Low dose BB started yesterday. Will need OP follow up with cardiology.   Acute respiratory failure in setting of #1 and #2. Chart indicates sats dropped to 79% on 2L with coughing. Provided with nebs and VM and sats up to 92%. Will continue antibiotics, nebs and lasix as noted above. Continue VM as oxygenation better with mask vs. [place]. Does not appear in distress this am. Monitor closely  Obesity: BMI 43.8. Nutritional consult  Hypokalemia: mild. Will replete. Mag level at low end of normal. Will replete and monitor given need for diuresis.   Tachycardia: reports hx of same. No events on tele. Little improvement. Range 101-113. Continue coreg and lasix.   HTN: improved control with coreg. Starting lasix today as above.   Code Status: full Family Communication: patient only Disposition Plan: home when ready   Consultants:  pulmonology  Procedures:  Echo left ventricle: The cavity size was severely dilated. Wall thickness was normal. Systolic function was severely reduced. The estimated ejection fraction was in the range of 20% to 25%.  Mitral valve: There was mild to moderate  regurgitation. - Left atrium: The atrium was mildly dilated.  Antibiotics:  Zosyn 10/07/14>>  Rocephin 19/16>>    HPI/Subjective: Sitting in chair. Reports some increase in work of breathing. Reports less coughing with pain medicine.   Objective: Filed Vitals:   10/09/14 0609  BP: 145/98  Pulse: 112  Temp: 98.6 F (37 C)  Resp: 22    Intake/Output Summary (Last 24 hours) at 10/09/14 1006 Last data filed at 10/09/14 0700  Gross per 24 hour  Intake 3238.33 ml  Output      0 ml  Net 3238.33 ml   Filed Weights   10/07/14 1220 10/07/14 1412 10/09/14 0825  Weight: 158.759 kg (350 lb) 161.027 kg (355 lb) 161.934 kg (357 lb)    Exam:   General:  Obese does not appear to be in distress  Cardiovascular: HS distant, tachycardia but regular, no MGR trace LE edema  Respiratory: mild increased work of breathing, decreased BS with fine crackles in bases  Abdomen: obese soft +BS non-tender  Musculoskeletal: no clubbing or cyanosis   Data Reviewed: Basic Metabolic Panel:  Recent Labs Lab 10/07/14 1248 10/07/14 1802 10/08/14 0508 10/09/14 0638  NA 137  --  138 136  K 3.0*  --  3.3* 3.4*  CL 103  --  105 102  CO2 24  --  24 26  GLUCOSE 101*  --  97 109*  BUN 14  --  14 13  CREATININE 0.98 1.03 1.01 0.89  CALCIUM 8.6*  --  8.0* 8.2*  MG  --   --  1.8  --    Liver Function Tests:  Recent Labs Lab 10/07/14 1248 10/08/14 0508  AST 24 24  ALT 32 31  ALKPHOS 60 51  BILITOT  1.2 1.2  PROT 7.2 6.7  ALBUMIN 3.7 3.3*   No results for input(s): LIPASE, AMYLASE in the last 168 hours. No results for input(s): AMMONIA in the last 168 hours. CBC:  Recent Labs Lab 10/07/14 1248 10/07/14 1802 10/08/14 0508 10/09/14 0638  WBC 14.0* 12.9* 10.9* 12.4*  NEUTROABS 13.0*  --   --   --   HGB 13.3 12.5* 12.1* 11.6*  HCT 40.4 38.5* 37.6* 36.9*  MCV 77.7* 77.8* 79.0 79.7  PLT 249 255 230 209   Cardiac Enzymes: No results for input(s): CKTOTAL, CKMB, CKMBINDEX,  TROPONINI in the last 168 hours. BNP (last 3 results)  Recent Labs  10/07/14 1535 10/08/14 1017  BNP 204.0* 177.0*    ProBNP (last 3 results) No results for input(s): PROBNP in the last 8760 hours.  CBG:  Recent Labs Lab 10/08/14 0745  GLUCAP 98    Recent Results (from the past 240 hour(s))  Culture, blood (routine x 2)     Status: None (Preliminary result)   Collection Time: 10/07/14  2:54 PM  Result Value Ref Range Status   Specimen Description BLOOD LEFT ARM  Final   Special Requests BOTTLES DRAWN AEROBIC AND ANAEROBIC 8CC BOTTLES  Final   Culture NO GROWTH 1 DAY  Final   Report Status PENDING  Incomplete  Culture, blood (routine x 2)     Status: None (Preliminary result)   Collection Time: 10/07/14  2:59 PM  Result Value Ref Range Status   Specimen Description BLOOD RIGHT HAND  Final   Special Requests BOTTLES DRAWN AEROBIC ONLY 8CC BOTTLE  Final   Culture NO GROWTH 1 DAY  Final   Report Status PENDING  Incomplete  Culture, blood (routine x 2) Call MD if unable to obtain prior to antibiotics being given     Status: None (Preliminary result)   Collection Time: 10/07/14  6:02 PM  Result Value Ref Range Status   Specimen Description BLOOD LEFT ANTECUBITAL  Final   Special Requests BOTTLES DRAWN AEROBIC AND ANAEROBIC 8CC  Final   Culture NO GROWTH 1 DAY  Final   Report Status PENDING  Incomplete  Culture, blood (routine x 2) Call MD if unable to obtain prior to antibiotics being given     Status: None (Preliminary result)   Collection Time: 10/07/14  6:10 PM  Result Value Ref Range Status   Specimen Description BLOOD LEFT HAND  Final   Special Requests BOTTLES DRAWN AEROBIC AND ANAEROBIC 7CC  Final   Culture NO GROWTH 1 DAY  Final   Report Status PENDING  Incomplete     Studies: Dg Chest 2 View  10/07/2014   CLINICAL DATA:  Cough and shortness of breath  EXAM: CHEST  2 VIEW  COMPARISON:  None.  FINDINGS: Mild enlargement of the cardiomediastinal silhouette is  noted. Multi lobar patchy airspace opacities are noted with central vascular congestion. No pleural effusion. No acute osseous finding.  IMPRESSION: Cardiomegaly with nonspecific patchy multi lobar airspace opacities. This could represent asymmetric edema but other alveolar filling processes including hemorrhage, infection, pus, or protein could appear similar.   Electronically Signed   By: Christiana Pellant M.D.   On: 10/07/2014 13:35   Ct Angio Chest Pe W/cm &/or Wo Cm  10/07/2014   CLINICAL DATA:  Shortness of breath and hemoptysis. History of cardiomyopathy.  EXAM: CT ANGIOGRAPHY CHEST WITH CONTRAST  TECHNIQUE: Multidetector CT imaging of the chest was performed using the standard protocol during bolus administration of intravenous contrast.  Multiplanar CT image reconstructions and MIPs were obtained to evaluate the vascular anatomy.  CONTRAST:  OMNIPAQUE IOHEXOL 350 MG/ML SOLN  COMPARISON:  Chest radiograph October 07, 2014  FINDINGS: There is no demonstrable pulmonary embolus. There is no thoracic aortic aneurysm or dissection.  There is extensive airspace consolidation throughout both lungs with essentially equivalent upper and lower lobe prominence. There is no appreciable interstitial edema. There is slight lower lobe bronchiectasis bilaterally.  Thyroid appears normal. There are small mediastinal lymph nodes but no adenopathy by size criteria. The pericardium is not thickened.  In the visualized upper abdomen, there is hepatic steatosis. Both kidneys show fetal lobulations, an anatomic variant.  There are no blastic or lytic bone lesions.  Review of the MIP images confirms the above findings.  IMPRESSION: No demonstrable pulmonary embolus.  Widespread airspace opacity bilaterally. Major differential considerations include extensive infectious pneumonia, pulmonary hemorrhage, or extensive noncardiogenic edema such as due to toxin exposure or medication reaction. The appearance is atypical for potential  congestive heart failure.  Multiple small mediastinal lymph nodes but no frank adenopathy by size criteria.  There is hepatic steatosis.   Electronically Signed   By: Bretta Bang III M.D.   On: 10/07/2014 16:04    Scheduled Meds: . azithromycin  500 mg Intravenous Q24H  . carvedilol  3.125 mg Oral BID WC  . cefTRIAXone (ROCEPHIN)  IV  1 g Intravenous Q24H  . furosemide  40 mg Intravenous BID  . heparin  5,000 Units Subcutaneous 3 times per day  . ipratropium-albuterol  3 mL Nebulization Q4H   Continuous Infusions: . sodium chloride 50 mL/hr at 10/09/14 1610    Principal Problem:   CAP (community acquired pneumonia) Active Problems:   Obesity (BMI 35.0-39.9 without comorbidity)   Hypertension   Tachycardia   Community acquired pneumonia   Cough   Hemoptysis   Acute systolic heart failure   Respiratory failure, acute    Time spent: 40 minutes    [facility] M  [facility] Pager [phone]. If 7PM-7AM, please contact night-coverage at [email or web address], password [facility] 10/09/2014, 10:06 AM  LOS: 2 days

## 2014-10-09 NOTE — Consult Note (Signed)
CARDIOLOGY CONSULT NOTE  Patient ID: Cole Singleton MRN: [id number] DOB/AGE: [birth year] 39 y.o.  Admit date: 10/07/2014 Primary Physician No primary care provider on file.  Reason for Consultation: cardiomyopathy  HPI: 39 yr old male with prior h/o cardiomyopathy admitted with two month h/o progressive dyspnea and cough and more recently associated with hemoptysis. Denies fevers.  Says he has long h/o bacterial pneumonia and sinus allergies.  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  CT of chest showed possible infection vs hemorrhage vs edema (although atypical picture for CHF). Pulmonary consulted and feels he has community acquired pneumonia and is being treated with broad-spectrum antimicrobial therapy.   Echocardiogram showed severely reduced LV systolic function, EF 76-54%, severe LV dilatation, and mild to moderate mitral regurgitation. He had reportedly been on cardiac meds but after reported improvement, meds were discontinued and he did not follow up with cardiology afterwards. Says he saw cardiologist in [place] initially in 2009 and was on 5 different medications including carvedilol and lisinopril. Says EF was 17%. Took meds for 2-3 years and then stopped taking on his own. Says EF normalized. Says cardiomyopathy at that time attributed to bacterial pneumonia. Denies having had stress test. Also denies chest pain and h/o MI. Has been having 2-3 pillow orthopnea and possible PND at times. Denies leg swelling.  ESR mildly elevated with normal ANCA and complement titers, ordered to rule out autoimmune process. HIV also negative. Urine negative for Legionella and Strep pneumoniae. BNP mildly elevated at 204-->177. Started on low-dose Coreg and IV Lasix for presumed CHF component.  ECG showed sinus tachycardia, HR 127 bpm, with probable LVH with  repolarization abnormalities.  Today says breathing slightly improved from yesterday.      Allergies  Allergen Reactions  . Codeine Other (See Comments)    Severe headache Pt reports being able to take percocet & vicodin    Current Facility-Administered Medications  Medication Dose Route Frequency Provider Last Rate Last Dose  . 0.9 %  sodium chloride infusion   Intravenous Continuous Radene Gunning, NP 10 mL/hr at 10/09/14 1101    . azithromycin (ZITHROMAX) tablet 500 mg  500 mg Oral q1800 Kathie Dike, MD      . carvedilol (COREG) tablet 3.125 mg  3.125 mg Oral BID WC Radene Gunning, NP   3.125 mg at 10/09/14 0820  . cefTRIAXone (ROCEPHIN) 1 g in dextrose 5 % 50 mL IVPB  1 g Intravenous Q24H Nimish C Gosrani, MD   1 g at 10/08/14 1733  . furosemide (LASIX) injection 40 mg  40 mg Intravenous BID Radene Gunning, NP   40 mg at 10/09/14 1100  . guaiFENesin (ROBITUSSIN) 100 MG/5ML solution 200 mg  200 mg Oral Q4H PRN Radene Gunning, NP   200 mg at 10/08/14 1251  . heparin injection 5,000 Units  5,000 Units Subcutaneous 3 times per day Doree Albee, MD   5,000 Units at 10/09/14 1441  . ipratropium-albuterol (DUONEB) 0.5-2.5 (3) MG/3ML nebulizer solution 3 mL  3 mL Nebulization Q4H PRN Nimish C Gosrani, MD   3 mL at 10/08/14 1627  . ipratropium-albuterol (DUONEB) 0.5-2.5 (3) MG/3ML nebulizer solution 3 mL  3 mL Nebulization Q4H Kathie Dike, MD   3 mL at 10/09/14 1526  . oxyCODONE-acetaminophen (PERCOCET/ROXICET) 5-325 MG per tablet 1 tablet  1 tablet Oral Q6H PRN Radene Gunning, NP  1 tablet at 10/09/14 1141  . zolpidem (AMBIEN) tablet 5 mg  5 mg Oral QHS PRN Jeryl Columbia, NP   5 mg at 10/07/14 2210    Past Medical History  Diagnosis Date  . Cardiomyopathy   . Hypertension   . Pneumonia   . Cardiomyopathy     History reviewed. No pertinent past surgical history.  History   Social History  . Marital Status: Married    Spouse Name: N/A  . Number of Children: N/A  .  Years of Education: N/A   Occupational History  . Not on file.   Social History Main Topics  . Smoking status: Never Smoker   . Smokeless tobacco: Current User    Types: Chew  . Alcohol Use: Yes     Comment: occas  . Drug Use: Not on file  . Sexual Activity: Not on file   Other Topics Concern  . Not on file   Social History Narrative     No family history of premature CAD in 1st degree relatives.  Prior to Admission medications   Medication Sig Start Date End Date Taking? Authorizing Provider  ibuprofen (ADVIL,MOTRIN) 200 MG tablet Take 800 mg by mouth every 6 (six) hours as needed for moderate pain.   Yes Historical Provider, MD  Pseudoeph-Doxylamine-DM-APAP (DAYQUIL/NYQUIL COLD/FLU RELIEF PO) Take 2 capsules by mouth every 4 (four) hours as needed (sinus congestion).   Yes Historical Provider, MD     Review of systems complete and found to be negative unless listed above in HPI     Physical exam Blood pressure 159/90, pulse 125, temperature 98.6 F (37 C), temperature source Oral, resp. rate 23, height $RemoveBe'6\' 3"'zJoeeWGQR$  (1.905 m), weight 357 lb (161.934 kg), SpO2 97 %. General: tachypneic, wearing O2 mask Neck: Difficult to assess JVP, no thyromegaly or thyroid nodule.  Lungs: Bibasilar crackles, no wheezes. CV: Nondisplaced PMI. Tachycardic, regular rhythm, normal S1/S2, no S3/S4.  No peripheral edema.  Abdomen: Soft, obese, no distention.  Skin: Intact without lesions or rashes.  Neurologic: Alert and oriented x 3.  Psych: Normal affect. Extremities: No clubbing or cyanosis.  HEENT: Normal.   ECG: Most recent ECG reviewed.  Labs:   Lab Results  Component Value Date   WBC 12.4* 10/09/2014   HGB 11.6* 10/09/2014   HCT 36.9* 10/09/2014   MCV 79.7 10/09/2014   PLT 209 10/09/2014    Recent Labs Lab 10/08/14 0508 10/09/14 0638  NA 138 136  K 3.3* 3.4*  CL 105 102  CO2 24 26  BUN 14 13  CREATININE 1.01 0.89  CALCIUM 8.0* 8.2*  PROT 6.7  --   BILITOT 1.2  --     ALKPHOS 51  --   ALT 31  --   AST 24  --   GLUCOSE 97 109*   No results found for: CKTOTAL, CKMB, CKMBINDEX, TROPONINI No results found for: CHOL No results found for: HDL No results found for: LDLCALC No results found for: TRIG No results found for: CHOLHDL No results found for: LDLDIRECT       Studies: No results found.  ASSESSMENT AND PLAN:  1. Acute on chronic systolic heart failure with cardiomyopathy and severe LV dysfunction: Given mild elevation of BNP, I do not think there is a strong component of CHF. Can continue IV Lasix today but would switch to oral diuretics on 6/22. Continue Coreg. Will start sacubitril and valsartan, and will contact rep about patient assistance.   2. Essential HTN:  Remains elevated on low-dose Coreg. Will start sacubitril and valsartan which will aid with control.  3. Community acquired pneumonia: Continue azithromycin and ceftriaxone.    Signed: Kate Sable, M.D., F.A.C.C.  10/09/2014, 4:52 PM

## 2014-10-09 NOTE — Progress Notes (Signed)
Increased pt o2 35% v.mask due to low spo2 of 88% on 30% v. Mask will continue to  Monitor.

## 2014-10-09 NOTE — Progress Notes (Signed)
Subjective: He says he is coughing nonproductively. His echocardiogram shows ejection fraction now between 20 and 25%. He has previous history of cardiomyopathy but was told by the cardiologist that he was seeing that he was 99% over this after taking medication for about a year and a half. He was told he could stop medications and did not need any follow-up. He now has cardiomyopathy again associated with pneumonia. He is not coughing up any blood now. He says he is more comfortable sitting up than lying flat  Objective: Vital signs in last 24 hours: Temp:  [98.1 F (36.7 C)-98.6 F (37 C)] 98.6 F (37 C) (06/21 0609) Pulse Rate:  [101-112] 112 (06/21 0609) Resp:  [20-22] 22 (06/21 0609) BP: (128-145)/(74-99) 145/98 mmHg (06/21 0609) SpO2:  [79 %-98 %] 95 % (06/21 0738) FiO2 (%):  [40 %-50 %] 40 % (06/21 0738) Weight:  [161.934 kg (357 lb)] 161.934 kg (357 lb) (06/21 0825) Weight change:  Last BM Date: 10/07/14  Intake/Output from previous day: 06/20 0701 - 06/21 0700 In: 3478.3 [P.O.:720; I.V.:2458.3; IV Piggyback:300] Out: -   PHYSICAL EXAM General appearance: alert, cooperative, mild distress and morbidly obese Resp: He still has some rhonchi and basilar rales but he is  clearer than yesterday Cardio: regular rate and rhythm, S1, S2 normal, no murmur, click, rub or gallop GI: soft, non-tender; bowel sounds normal; no masses,  no organomegaly Extremities: He has trace to 1+ edema  Lab Results:  Results for orders placed or performed during the hospital encounter of 10/07/14 (from the past 48 hour(s))  Comprehensive metabolic panel     Status: Abnormal   Collection Time: 10/07/14 12:48 PM  Result Value Ref Range   Sodium 137 135 - 145 mmol/L   Potassium 3.0 (L) 3.5 - 5.1 mmol/L   Chloride 103 101 - 111 mmol/L   CO2 24 22 - 32 mmol/L   Glucose, Bld 101 (H) 65 - 99 mg/dL   BUN 14 6 - 20 mg/dL   Creatinine, Ser 0.98 0.61 - 1.24 mg/dL   Calcium 8.6 (L) 8.9 - 10.3 mg/dL    Total Protein 7.2 6.5 - 8.1 g/dL   Albumin 3.7 3.5 - 5.0 g/dL   AST 24 15 - 41 U/L   ALT 32 17 - 63 U/L   Alkaline Phosphatase 60 38 - 126 U/L   Total Bilirubin 1.2 0.3 - 1.2 mg/dL   GFR calc non Af Amer >60 >60 mL/min   GFR calc Af Amer >60 >60 mL/min    Comment: (NOTE) The eGFR has been calculated using the CKD EPI equation. This calculation has not been validated in all clinical situations. eGFR's persistently <60 mL/min signify possible Chronic Kidney Disease.    Anion gap 10 5 - 15  CBC with Differential/Platelet     Status: Abnormal   Collection Time: 10/07/14 12:48 PM  Result Value Ref Range   WBC 14.0 (H) 4.0 - 10.5 K/uL   RBC 5.20 4.22 - 5.81 MIL/uL   Hemoglobin 13.3 13.0 - 17.0 g/dL   HCT 40.4 39.0 - 52.0 %   MCV 77.7 (L) 78.0 - 100.0 fL   MCH 25.6 (L) 26.0 - 34.0 pg   MCHC 32.9 30.0 - 36.0 g/dL   RDW 15.0 11.5 - 15.5 %   Platelets 249 150 - 400 K/uL   Neutrophils Relative % 92 (H) 43 - 77 %   Neutro Abs 13.0 (H) 1.7 - 7.7 K/uL   Lymphocytes Relative 5 (L) 12 -  46 %   Lymphs Abs 0.6 (L) 0.7 - 4.0 K/uL   Monocytes Relative 3 3 - 12 %   Monocytes Absolute 0.4 0.1 - 1.0 K/uL   Eosinophils Relative 0 0 - 5 %   Eosinophils Absolute 0.0 0.0 - 0.7 K/uL   Basophils Relative 0 0 - 1 %   Basophils Absolute 0.0 0.0 - 0.1 K/uL  Culture, blood (routine x 2)     Status: None (Preliminary result)   Collection Time: 10/07/14  2:54 PM  Result Value Ref Range   Specimen Description BLOOD LEFT ARM    Special Requests BOTTLES DRAWN AEROBIC AND ANAEROBIC 8CC BOTTLES    Culture NO GROWTH 1 DAY    Report Status PENDING   Culture, blood (routine x 2)     Status: None (Preliminary result)   Collection Time: 10/07/14  2:59 PM  Result Value Ref Range   Specimen Description BLOOD RIGHT HAND    Special Requests BOTTLES DRAWN AEROBIC ONLY 8CC BOTTLE    Culture NO GROWTH 1 DAY    Report Status PENDING   I-Stat CG4 Lactic Acid, ED     Status: None   Collection Time: 10/07/14  3:33 PM   Result Value Ref Range   Lactic Acid, Venous 0.79 0.5 - 2.0 mmol/L  Brain natriuretic peptide     Status: Abnormal   Collection Time: 10/07/14  3:35 PM  Result Value Ref Range   B Natriuretic Peptide 204.0 (H) 0.0 - 100.0 pg/mL  HIV antibody     Status: None   Collection Time: 10/07/14  6:02 PM  Result Value Ref Range   HIV Screen 4th Generation wRfx Non Reactive Non Reactive    Comment: (NOTE) Performed At: [facility] [place], [place] [id number] Lindon Romp MD [id number]   Culture, blood (routine x 2) Call MD if unable to obtain prior to antibiotics being given     Status: None (Preliminary result)   Collection Time: 10/07/14  6:02 PM  Result Value Ref Range   Specimen Description BLOOD LEFT ANTECUBITAL    Special Requests BOTTLES DRAWN AEROBIC AND ANAEROBIC 8CC    Culture NO GROWTH 1 DAY    Report Status PENDING   CBC     Status: Abnormal   Collection Time: 10/07/14  6:02 PM  Result Value Ref Range   WBC 12.9 (H) 4.0 - 10.5 K/uL   RBC 4.95 4.22 - 5.81 MIL/uL   Hemoglobin 12.5 (L) 13.0 - 17.0 g/dL   HCT 38.5 (L) 39.0 - 52.0 %   MCV 77.8 (L) 78.0 - 100.0 fL   MCH 25.3 (L) 26.0 - 34.0 pg   MCHC 32.5 30.0 - 36.0 g/dL   RDW 15.2 11.5 - 15.5 %   Platelets 255 150 - 400 K/uL  Creatinine, serum     Status: None   Collection Time: 10/07/14  6:02 PM  Result Value Ref Range   Creatinine, Ser 1.03 0.61 - 1.24 mg/dL   GFR calc non Af Amer >60 >60 mL/min   GFR calc Af Amer >60 >60 mL/min    Comment: (NOTE) The eGFR has been calculated using the CKD EPI equation. This calculation has not been validated in all clinical situations. eGFR's persistently <60 mL/min signify possible Chronic Kidney Disease.   Culture, blood (routine x 2) Call MD if unable to obtain prior to antibiotics being given     Status: None (Preliminary result)   Collection Time: 10/07/14  6:10 PM  Result Value Ref Range   Specimen Description BLOOD LEFT HAND    Special Requests  BOTTLES DRAWN AEROBIC AND ANAEROBIC [place]    Culture NO GROWTH 1 DAY    Report Status PENDING   CG4 I-STAT (Lactic acid)     Status: None   Collection Time: 10/07/14  6:17 PM  Result Value Ref Range   Lactic Acid, Venous 0.84 0.5 - 2.0 mmol/L  Comprehensive metabolic panel     Status: Abnormal   Collection Time: 10/08/14  5:08 AM  Result Value Ref Range   Sodium 138 135 - 145 mmol/L   Potassium 3.3 (L) 3.5 - 5.1 mmol/L   Chloride 105 101 - 111 mmol/L   CO2 24 22 - 32 mmol/L   Glucose, Bld 97 65 - 99 mg/dL   BUN 14 6 - 20 mg/dL   Creatinine, Ser 1.01 0.61 - 1.24 mg/dL   Calcium 8.0 (L) 8.9 - 10.3 mg/dL   Total Protein 6.7 6.5 - 8.1 g/dL   Albumin 3.3 (L) 3.5 - 5.0 g/dL   AST 24 15 - 41 U/L   ALT 31 17 - 63 U/L   Alkaline Phosphatase 51 38 - 126 U/L   Total Bilirubin 1.2 0.3 - 1.2 mg/dL   GFR calc non Af Amer >60 >60 mL/min   GFR calc Af Amer >60 >60 mL/min    Comment: (NOTE) The eGFR has been calculated using the CKD EPI equation. This calculation has not been validated in all clinical situations. eGFR's persistently <60 mL/min signify possible Chronic Kidney Disease.    Anion gap 9 5 - 15  CBC     Status: Abnormal   Collection Time: 10/08/14  5:08 AM  Result Value Ref Range   WBC 10.9 (H) 4.0 - 10.5 K/uL   RBC 4.76 4.22 - 5.81 MIL/uL   Hemoglobin 12.1 (L) 13.0 - 17.0 g/dL   HCT 37.6 (L) 39.0 - 52.0 %   MCV 79.0 78.0 - 100.0 fL   MCH 25.4 (L) 26.0 - 34.0 pg   MCHC 32.2 30.0 - 36.0 g/dL   RDW 15.7 (H) 11.5 - 15.5 %   Platelets 230 150 - 400 K/uL  Magnesium     Status: None   Collection Time: 10/08/14  5:08 AM  Result Value Ref Range   Magnesium 1.8 1.7 - 2.4 mg/dL  Strep pneumoniae urinary antigen     Status: None   Collection Time: 10/08/14  6:00 AM  Result Value Ref Range   Strep Pneumo Urinary Antigen NEGATIVE NEGATIVE    Comment:        Infection due to S. pneumoniae cannot be absolutely ruled out since the antigen present may be below the detection limit of  the test.   Glucose, capillary     Status: None   Collection Time: 10/08/14  7:45 AM  Result Value Ref Range   Glucose-Capillary 98 65 - 99 mg/dL  Sedimentation rate     Status: Abnormal   Collection Time: 10/08/14 10:17 AM  Result Value Ref Range   Sed Rate 30 (H) 0 - 16 mm/hr  C3 complement     Status: None   Collection Time: 10/08/14 10:17 AM  Result Value Ref Range   C3 Complement 143 82 - 167 mg/dL    Comment: (NOTE) Performed At: [facility] [place], [place] [id number] Lindon Romp MD [id number]   C4 complement     Status: None   Collection Time: 10/08/14 10:17  AM  Result Value Ref Range   Complement C4, Body Fluid 28 14 - 44 mg/dL    Comment: (NOTE) Performed At: [facility] [place], [place] [id number] Lindon Romp MD [id number]   Brain natriuretic peptide     Status: Abnormal   Collection Time: 10/08/14 10:17 AM  Result Value Ref Range   B Natriuretic Peptide 177.0 (H) 0.0 - 100.0 pg/mL  CBC     Status: Abnormal   Collection Time: 10/09/14  6:38 AM  Result Value Ref Range   WBC 12.4 (H) 4.0 - 10.5 K/uL   RBC 4.63 4.22 - 5.81 MIL/uL   Hemoglobin 11.6 (L) 13.0 - 17.0 g/dL   HCT 36.9 (L) 39.0 - 52.0 %   MCV 79.7 78.0 - 100.0 fL   MCH 25.1 (L) 26.0 - 34.0 pg   MCHC 31.4 30.0 - 36.0 g/dL   RDW 15.6 (H) 11.5 - 15.5 %   Platelets 209 150 - 400 K/uL  Basic metabolic panel     Status: Abnormal   Collection Time: 10/09/14  6:38 AM  Result Value Ref Range   Sodium 136 135 - 145 mmol/L   Potassium 3.4 (L) 3.5 - 5.1 mmol/L   Chloride 102 101 - 111 mmol/L   CO2 26 22 - 32 mmol/L   Glucose, Bld 109 (H) 65 - 99 mg/dL   BUN 13 6 - 20 mg/dL   Creatinine, Ser 0.89 0.61 - 1.24 mg/dL   Calcium 8.2 (L) 8.9 - 10.3 mg/dL   GFR calc non Af Amer >60 >60 mL/min   GFR calc Af Amer >60 >60 mL/min    Comment: (NOTE) The eGFR has been calculated using the CKD EPI equation. This calculation has not been validated in all  clinical situations. eGFR's persistently <60 mL/min signify possible Chronic Kidney Disease.    Anion gap 8 5 - 15    ABGS No results for input(s): PHART, PO2ART, TCO2, HCO3 in the last 72 hours.  Invalid input(s): PCO2 CULTURES Recent Results (from the past 240 hour(s))  Culture, blood (routine x 2)     Status: None (Preliminary result)   Collection Time: 10/07/14  2:54 PM  Result Value Ref Range Status   Specimen Description BLOOD LEFT ARM  Final   Special Requests BOTTLES DRAWN AEROBIC AND ANAEROBIC 8CC BOTTLES  Final   Culture NO GROWTH 1 DAY  Final   Report Status PENDING  Incomplete  Culture, blood (routine x 2)     Status: None (Preliminary result)   Collection Time: 10/07/14  2:59 PM  Result Value Ref Range Status   Specimen Description BLOOD RIGHT HAND  Final   Special Requests BOTTLES DRAWN AEROBIC ONLY 8CC BOTTLE  Final   Culture NO GROWTH 1 DAY  Final   Report Status PENDING  Incomplete  Culture, blood (routine x 2) Call MD if unable to obtain prior to antibiotics being given     Status: None (Preliminary result)   Collection Time: 10/07/14  6:02 PM  Result Value Ref Range Status   Specimen Description BLOOD LEFT ANTECUBITAL  Final   Special Requests BOTTLES DRAWN AEROBIC AND ANAEROBIC 8CC  Final   Culture NO GROWTH 1 DAY  Final   Report Status PENDING  Incomplete  Culture, blood (routine x 2) Call MD if unable to obtain prior to antibiotics being given     Status: None (Preliminary result)   Collection Time: 10/07/14  6:10 PM  Result Value Ref Range Status  Specimen Description BLOOD LEFT HAND  Final   Special Requests BOTTLES DRAWN AEROBIC AND ANAEROBIC [place]  Final   Culture NO GROWTH 1 DAY  Final   Report Status PENDING  Incomplete   Studies/Results: Dg Chest 2 View  10/07/2014   CLINICAL DATA:  Cough and shortness of breath  EXAM: CHEST  2 VIEW  COMPARISON:  None.  FINDINGS: Mild enlargement of the cardiomediastinal silhouette is noted. Multi lobar patchy  airspace opacities are noted with central vascular congestion. No pleural effusion. No acute osseous finding.  IMPRESSION: Cardiomegaly with nonspecific patchy multi lobar airspace opacities. This could represent asymmetric edema but other alveolar filling processes including hemorrhage, infection, pus, or protein could appear similar.   Electronically Signed   By: Conchita Paris M.D.   On: 10/07/2014 13:35   Ct Angio Chest Pe W/cm &/or Wo Cm  10/07/2014   CLINICAL DATA:  Shortness of breath and hemoptysis. History of cardiomyopathy.  EXAM: CT ANGIOGRAPHY CHEST WITH CONTRAST  TECHNIQUE: Multidetector CT imaging of the chest was performed using the standard protocol during bolus administration of intravenous contrast. Multiplanar CT image reconstructions and MIPs were obtained to evaluate the vascular anatomy.  CONTRAST:  136mL OMNIPAQUE IOHEXOL 350 MG/ML SOLN  COMPARISON:  Chest radiograph October 07, 2014  FINDINGS: There is no demonstrable pulmonary embolus. There is no thoracic aortic aneurysm or dissection.  There is extensive airspace consolidation throughout both lungs with essentially equivalent upper and lower lobe prominence. There is no appreciable interstitial edema. There is slight lower lobe bronchiectasis bilaterally.  Thyroid appears normal. There are small mediastinal lymph nodes but no adenopathy by size criteria. The pericardium is not thickened.  In the visualized upper abdomen, there is hepatic steatosis. Both kidneys show fetal lobulations, an anatomic variant.  There are no blastic or lytic bone lesions.  Review of the MIP images confirms the above findings.  IMPRESSION: No demonstrable pulmonary embolus.  Widespread airspace opacity bilaterally. Major differential considerations include extensive infectious pneumonia, pulmonary hemorrhage, or extensive noncardiogenic edema such as due to toxin exposure or medication reaction. The appearance is atypical for potential congestive heart failure.   Multiple small mediastinal lymph nodes but no frank adenopathy by size criteria.  There is hepatic steatosis.   Electronically Signed   By: Lowella Grip III M.D.   On: 10/07/2014 16:04    Medications:  Prior to Admission:  Prescriptions prior to admission  Medication Sig Dispense Refill Last Dose  . ibuprofen (ADVIL,MOTRIN) 200 MG tablet Take 800 mg by mouth every 6 (six) hours as needed for moderate pain.   10/07/2014 at Unknown time  . Pseudoeph-Doxylamine-DM-APAP (DAYQUIL/NYQUIL COLD/FLU RELIEF PO) Take 2 capsules by mouth every 4 (four) hours as needed (sinus congestion).   10/07/2014 at Unknown time   Scheduled: . azithromycin  500 mg Intravenous Q24H  . carvedilol  3.125 mg Oral BID WC  . cefTRIAXone (ROCEPHIN)  IV  1 g Intravenous Q24H  . heparin  5,000 Units Subcutaneous 3 times per day  . ipratropium-albuterol  3 mL Nebulization Q4H   Continuous: . sodium chloride 50 mL/hr at 10/09/14 0618   LFY:BOFBPZWCHEN, ipratropium-albuterol, oxyCODONE-acetaminophen, zolpidem  Assesment: He has community-acquired pneumonia. He has had hemoptysis associated with this but that seems to have largely resolved. He has recurrent cardiomyopathy. He says the last time he had cardiomyopathy it was associated with pneumonia. Active Problems:   CAP (community acquired pneumonia)   Obesity (BMI 35.0-39.9 without comorbidity)   Hypertension   Tachycardia  Community acquired pneumonia   Cough   Hemoptysis    Plan: Continue current antibiotics. He will have treatment for cardiomyopathy. Cardiomyopathy associated with pneumonia is typically related to viral pneumonia but I do think he continues to need antibiotics. Check Legionella and mycoplasma titers if not already done check for other problems like Wegener's    LOS: 2 days   Stpehanie Montroy L 10/09/2014, 8:50 AM

## 2014-10-09 NOTE — Consult Note (Signed)
Cole Singleton, Cole Singleton            ACCOUNT NO.:  [id number]  MEDICAL RECORD NO.:  [id number]  LOCATION:  A330                          FACILITY:  [facility]  PHYSICIAN:  Makenah Karas L. Juanetta Gosling, M.D.DATE OF BIRTH:  [birth year]  DATE OF CONSULTATION:  10/08/2014 DATE OF DISCHARGE:                                CONSULTATION   REASON FOR CONSULTATION:  Abnormal chest x-ray.  HISTORY:  This is a 39 years old who has a history of 3-4 weeks of cough and congestion.  He says this happens to him every spring.  He develops problems with allergies, he gets coughing and sneezing.  He says it then typically turns into pneumonia, and he ends up in the hospital.  He has been coughing normally nonproductively, but over the last 24 hours been coughing up blood with yellow sputum.  He is having more trouble breathing.  He has not had any fever.  He says he has not had any chest pain.  No nausea, vomiting, abdominal pain.  He says he has trouble with allergies every year and in addition to that, he works around noxious chemicals in his job.  Chest x-ray and CT are consistent with bilateral pneumonia versus pulmonary hemorrhage.  PAST MEDICAL HISTORY:  Positive for cardiomyopathy, hypertension, morbid obesity.  SOCIAL HISTORY:  He does not smoke.  He does chew tobacco.  He does use alcohol.  ALLERGIES:  He is allergic to CODEINE.  FAMILY HISTORY:  Positive for allergies, but not really positive for significant lung disease.  There is a significant history of heart disease.  MEDICATIONS:  At home, he has been taking ibuprofen and DayQuil or NyQuil.  REVIEW OF SYSTEMS:  Except as mentioned is negative.  PHYSICAL EXAMINATION:  GENERAL:  Shows a well-developed, dyspneic appearing, morbidly obese man.  He is coughing at intervals during the examination. HEENT:  His pupils react.  Nose and throat are clear.  Mucous membranes are moist. NECK:  Supple without masses. CHEST:  He has rhonchi bilaterally.  No  wheezing. HEART:  Regular without gallop. ABDOMEN:  Soft.  No tenderness.  Obese.  No masses. EXTREMITIES:  Showed no edema. CENTRAL NERVOUS SYSTEM:  Grossly intact.  ASSESSMENT:  He has pneumonia.  He is having hemoptysis, but I think considering the clinical course pulmonary hemorrhage is less likely.  He may have some element of heart failure with his history of cardiomyopathy.  He has exposure to noxious chemicals at his workplace and that may have some influence on what is happening with him at this point.  I am not sure if he is going to need bronchoscopy, I think we should see how he does clinically, I am going to expect his hemoptysis to resolve over the next 2-3 days if this is from pneumonia.  Thanks for allowing me to see him with you.     Lizzie An L. Juanetta Gosling, M.D.     ELH/MEDQ  D:  10/09/2014  T:  10/09/2014  Job:  [id number]

## 2014-10-09 NOTE — Progress Notes (Signed)
PHARMACIST - PHYSICIAN COMMUNICATION DR:   Memon CONCERNING: Antibiotic IV to Oral Route Change Policy  RECOMMENDATION: This patient is receiving Zithromax by the intravenous route.  Based on criteria approved by the Pharmacy and Therapeutics Committee, the antibiotic(s) is/are being converted to the equivalent oral dose form(s).   DESCRIPTION: These criteria include:  Patient being treated for a respiratory tract infection, urinary tract infection, cellulitis or clostridium difficile associated diarrhea if on metronidazole  The patient is not neutropenic and does not exhibit a GI malabsorption state  The patient is eating (either orally or via tube) and/or has been taking other orally administered medications for a least 24 hours  The patient is improving clinically and has a Tmax < 100.5  If you have questions about this conversion, please contact the Pharmacy Department  [x]  ( [phone] )  [place] []  ( [phone] )  [facility] []  ( [phone]  [place] []  ( [phone]  [facility] []  ( [phone] )  [facility]     S. Crislyn Willbanks, PharmD 

## 2014-10-10 DIAGNOSIS — D509 Iron deficiency anemia, unspecified: Secondary | ICD-10-CM | POA: Diagnosis present

## 2014-10-10 DIAGNOSIS — D649 Anemia, unspecified: Secondary | ICD-10-CM | POA: Diagnosis present

## 2014-10-10 DIAGNOSIS — E876 Hypokalemia: Secondary | ICD-10-CM | POA: Insufficient documentation

## 2014-10-10 DIAGNOSIS — R0902 Hypoxemia: Secondary | ICD-10-CM

## 2014-10-10 LAB — GLOMERULAR BASEMENT MEMBRANE ANTIBODIES: GBM Ab: 3 units (ref 0–20)

## 2014-10-10 LAB — CULTURE, RESPIRATORY: CULTURE: NORMAL

## 2014-10-10 LAB — CBC
HCT: 33.2 % — ABNORMAL LOW (ref 39.0–52.0)
HEMOGLOBIN: 10.4 g/dL — AB (ref 13.0–17.0)
MCH: 24.9 pg — ABNORMAL LOW (ref 26.0–34.0)
MCHC: 31.3 g/dL (ref 30.0–36.0)
MCV: 79.6 fL (ref 78.0–100.0)
Platelets: 239 10*3/uL (ref 150–400)
RBC: 4.17 MIL/uL — ABNORMAL LOW (ref 4.22–5.81)
RDW: 15.5 % (ref 11.5–15.5)
WBC: 8.8 10*3/uL (ref 4.0–10.5)

## 2014-10-10 LAB — MYCOPLASMA PNEUMONIAE ANTIBODY, IGM: Mycoplasma pneumo IgM: 89 U/mL (ref <770)

## 2014-10-10 LAB — BASIC METABOLIC PANEL
ANION GAP: 9 (ref 5–15)
BUN: 15 mg/dL (ref 6–20)
CALCIUM: 8.1 mg/dL — AB (ref 8.9–10.3)
CO2: 29 mmol/L (ref 22–32)
Chloride: 100 mmol/L — ABNORMAL LOW (ref 101–111)
Creatinine, Ser: 0.97 mg/dL (ref 0.61–1.24)
GFR calc Af Amer: >60 mL/min (ref >60)
GFR calc non Af Amer: >60 mL/min (ref >60)
Glucose, Bld: 99 mg/dL (ref 65–99)
POTASSIUM: 3.1 mmol/L — AB (ref 3.5–5.1)
SODIUM: 138 mmol/L (ref 135–145)

## 2014-10-10 LAB — ANCA TITERS
Atypical P-ANCA titer: <1:20 {titer}
C-ANCA: <1:20 {titer}
P-ANCA: <1:20 {titer}

## 2014-10-10 LAB — CULTURE, RESPIRATORY W GRAM STAIN

## 2014-10-10 LAB — MPO/PR-3 (ANCA) ANTIBODIES: Myeloperoxidase Abs: <9 U/mL (ref 0.0–9.0)

## 2014-10-10 MED ORDER — FUROSEMIDE 40 MG PO TABS
40.0000 mg | ORAL_TABLET | Freq: Every day | ORAL | Status: DC
  Administered 2014-10-10 – 2014-10-12 (×3): 40 mg via ORAL
  Filled 2014-10-10 (×3): qty 1

## 2014-10-10 MED ORDER — POTASSIUM CHLORIDE CRYS ER 20 MEQ PO TBCR
40.0000 meq | EXTENDED_RELEASE_TABLET | Freq: Once | ORAL | Status: AC
  Administered 2014-10-10: 40 meq via ORAL
  Filled 2014-10-10: qty 2

## 2014-10-10 NOTE — Progress Notes (Signed)
Subjective:  Feels like he can get a deeper breath  Objective:  Vital Signs in the last 24 hours: Temp:  [98.2 F (36.8 C)-98.6 F (37 C)] 98.2 F (36.8 C) (06/22 0544) Pulse Rate:  [101-125] 107 (06/22 0544) Resp:  [20-23] 20 (06/22 0544) BP: (138-159)/(72-90) 138/72 mmHg (06/22 0544) SpO2:  [77 %-97 %] 93 % (06/22 0544) FiO2 (%):  [30 %-40 %] 30 % (06/21 2257) Weight:  [351 lb 12.8 oz (159.575 kg)-357 lb (161.934 kg)] 351 lb 12.8 oz (159.575 kg) (06/22 0544)  Intake/Output from previous day: 06/21 0701 - 06/22 0700 In: 687.7 [P.O.:360; I.V.:277.7; IV Piggyback:50] Out: -  Intake/Output from this shift:    Physical Exam: NECK: Without JVD, HJR, or bruit LUNGS:Decreased breath sounds with crackles at the bases HEART: Regular rate and rhythm, no murmur, gallop, rub, bruit, thrill, or heave EXTREMITIES: plus 1-2 edema.Without cyanosis, clubbing   Lab Results:  Recent Labs  10/09/14 0638 10/10/14 0628  WBC 12.4* 8.8  HGB 11.6* 10.4*  PLT 209 239    Recent Labs  10/09/14 0638 10/10/14 0628  NA 136 138  K 3.4* 3.1*  CL 102 100*  CO2 26 29  GLUCOSE 109* 99  BUN 13 15  CREATININE 0.89 0.97   No results for input(s): TROPONINI in the last 72 hours.  Invalid input(s): CK, MB Hepatic Function Panel  Recent Labs  10/08/14 0508  PROT 6.7  ALBUMIN 3.3*  AST 24  ALT 31  ALKPHOS 51  BILITOT 1.2   No results for input(s): CHOL in the last 72 hours. No results for input(s): PROTIME in the last 72 hours.    Cardiac Studies: 2-D echo 10/08/14 Study Conclusions  - Left ventricle: The cavity size was severely dilated. Wall   thickness was normal. Systolic function was severely reduced. The   estimated ejection fraction was in the range of 20% to 25%. - Mitral valve: There was mild to moderate regurgitation. - Left atrium: The atrium was mildly dilated.   Assessment/Plan: .   Acute on chronic systolic heart failure with cardiomyopathy and severe LV  dysfunction EF 20-25%: Given mild elevation of BNP, I do not think there is a strong component of CHF. I/O's positive 1360 despite IV Lasix, although I/o's may be inaccurate. switched to oral diuretics today.. Continue Coreg.  sacubitril and valsartan started, and rep contacted about patient assistance. ? Candidate for ICD  2. Essential HTN: Remains elevated on low-dose Coreg. Will start sacubitril and valsartan which will aid with control.  3. Community acquired pneumonia: Continue azithromycin and ceftriaxone.      LOS: 3 days    Jacolyn Reedy PA-C 10/10/2014, 7:46 AM   The patient was seen and examined, and I agree with the assessment and plan as documented above, with modifications as noted below. Pt reports gradual improvement in shortness of breath. Has coughed up "black sputum". BP better controlled with addition of sacubitril and valsartan. Also on Coreg. Will need at least 3 months of optimal medical therapy before repeat echocardiogram should be performed to determine whether or not he is an ICD candidate. Continue broad-spectrum antimicrobial therapy for community acquired pneumonia. WBC now normalized.

## 2014-10-10 NOTE — Progress Notes (Signed)
[facility] PROGRESS NOTE  Cole Singleton ZOX:[id number] DOB: Mar 02, 1976 DOA: 10/07/2014 PCP: No primary care provider on file.  Assessment/Plan: Acute respiratory failure in setting CAP and acute systolic heart failure. Oxygen saturation dropped to 77% on room air last night while sleeping per chart . Improved with oxygen. Continues with sputum that is "dark".  Evaluated by pulmonology who opines respiratory status improving.  Continue with nebs and VM.  Will continue nebs and lasix as well as oxygen supplementation  Acute systolic HF. Echo with EF 20%, left ventricle cavity severely dilated. Weight 159.5kg down from 161.9kg.  Evaluated by cardiology who opine BNP with mild elevation may indicate not strong component of CHF. IV lasix transitioned to oral today per cards recommendation. Will continue coreg. Sacubitril and valsartan per cards. May be candidate for ICD per cardiology.   Community-acquired pneumonia. Chest x-ray and CT scan abnormal with concern for pulmonary hemorrhage vs edema vs pna. Hemoptysis improving.  Evaluated by pulmonology who opines cardiomyopathy associated with pneumonia typically related to viral pneumonia but recommended continuation of antibiotics. Strep pneumoniae urine antigen negative. legionealla urine antigen negative. Leukocytosis resolved. He remains afebrile and non-toxic appearing. Continue zosyn and rocephin day #4  Anemia: Hg trending down with diuresis. Likely related to above. Obtain anemia panel. monitor  Obesity: BMI 43.8. Nutritional consult  Hypokalemia: mild. Mag level at low end of normal. Will replete and monitor given need for diuresis.   Tachycardia: reports hx of same. No events on tele. Little improvement. Range 101-125. Appreciate cardiology assistance. See above  HTN: improved control with coreg and sacubitril and valsartan. transition lasix today as above.  Code Status: full Family Communication: patient only Disposition Plan:  home when ready   Consultants:  Cardiology  pulmonology  Procedures:  Echo left ventricle: The cavity size was severely dilated. Wall thickness was normal. Systolic function was severely reduced. The estimated ejection fraction was in the range of 20% to 25%. Mitral valve: There was mild to moderate regurgitation. - Left atrium: The atrium was mildly dilated  Antibiotics:  Zosyn 10/07/14>>  Rocephin 19/16>>  HPI/Subjective: Sitting up in chair. Reports "breathing better and slept well".   Objective: Filed Vitals:   10/10/14 0544  BP: 138/72  Pulse: 107  Temp: 98.2 F (36.8 C)  Resp: 20    Intake/Output Summary (Last 24 hours) at 10/10/14 1010 Last data filed at 10/09/14 1901  Gross per 24 hour  Intake 447.67 ml  Output      0 ml  Net 447.67 ml   Filed Weights   10/07/14 1412 10/09/14 0825 10/10/14 0544  Weight: 161.027 kg (355 lb) 161.934 kg (357 lb) 159.575 kg (351 lb 12.8 oz)    Exam:   General:  Obese appears comfortable  Cardiovascular: RRR no MGR 1+LE edema bilaterally   Respiratory: normal effort with conversation. BS distant with diffuse crackles  Abdomen: obese soft +BS non-tender  Musculoskeletal: joints without swelling or cyanosis   Data Reviewed: Basic Metabolic Panel:  Recent Labs Lab 10/07/14 1248 10/07/14 1802 10/08/14 0508 10/09/14 0638 10/10/14 0628  NA 137  --  138 136 138  K 3.0*  --  3.3* 3.4* 3.1*  CL 103  --  105 102 100*  CO2 24  --  GLUCOSE 101*  --  97 109* 99  BUN 14  --  CREATININE 0.98 1.03 1.01 0.89 0.97  CALCIUM 8.6*  --  8.0* 8.2* 8.1*  MG  --   --  1.8  --   --    Liver Function Tests:  Recent Labs Lab 10/07/14 1248 10/08/14 0508  AST 24 24  ALT 32 31  ALKPHOS 60 51  BILITOT 1.2 1.2  PROT 7.2 6.7  ALBUMIN 3.7 3.3*   No results for input(s): LIPASE, AMYLASE in the last 168 hours. No results for input(s): AMMONIA in the last 168 hours. CBC:  Recent Labs Lab  10/07/14 1248 10/07/14 1802 10/08/14 0508 10/09/14 0638 10/10/14 0628  WBC 14.0* 12.9* 10.9* 12.4* 8.8  NEUTROABS 13.0*  --   --   --   --   HGB 13.3 12.5* 12.1* 11.6* 10.4*  HCT 40.4 38.5* 37.6* 36.9* 33.2*  MCV 77.7* 77.8* 79.0 79.7 79.6  PLT 249 255 230 209 239   Cardiac Enzymes: No results for input(s): CKTOTAL, CKMB, CKMBINDEX, TROPONINI in the last 168 hours. BNP (last 3 results)  Recent Labs  10/07/14 1535 10/08/14 1017  BNP 204.0* 177.0*    ProBNP (last 3 results) No results for input(s): PROBNP in the last 8760 hours.  CBG:  Recent Labs Lab 10/08/14 0745  GLUCAP 98    Recent Results (from the past 240 hour(s))  Culture, blood (routine x 2)     Status: None (Preliminary result)   Collection Time: 10/07/14  2:54 PM  Result Value Ref Range Status   Specimen Description BLOOD LEFT ARM  Final   Special Requests BOTTLES DRAWN AEROBIC AND ANAEROBIC 8CC BOTTLES  Final   Culture NO GROWTH 2 DAYS  Final   Report Status PENDING  Incomplete  Culture, blood (routine x 2)     Status: None (Preliminary result)   Collection Time: 10/07/14  2:59 PM  Result Value Ref Range Status   Specimen Description BLOOD RIGHT HAND  Final   Special Requests BOTTLES DRAWN AEROBIC ONLY 8CC BOTTLE  Final   Culture NO GROWTH 2 DAYS  Final   Report Status PENDING  Incomplete  Culture, blood (routine x 2) Call MD if unable to obtain prior to antibiotics being given     Status: None (Preliminary result)   Collection Time: 10/07/14  6:02 PM  Result Value Ref Range Status   Specimen Description BLOOD LEFT ANTECUBITAL  Final   Special Requests BOTTLES DRAWN AEROBIC AND ANAEROBIC 8CC  Final   Culture NO GROWTH 2 DAYS  Final   Report Status PENDING  Incomplete  Culture, blood (routine x 2) Call MD if unable to obtain prior to antibiotics being given     Status: None (Preliminary result)   Collection Time: 10/07/14  6:10 PM  Result Value Ref Range Status   Specimen Description BLOOD LEFT  HAND  Final   Special Requests BOTTLES DRAWN AEROBIC AND ANAEROBIC 7CC  Final   Culture NO GROWTH 2 DAYS  Final   Report Status PENDING  Incomplete  Culture, respiratory (NON-Expectorated)     Status: None (Preliminary result)   Collection Time: 10/08/14  6:00 AM  Result Value Ref Range Status   Specimen Description SPUTUM BLDY  Final   Special Requests NONE  Final   Gram Stain   Final    FEW WBC PRESENT,BOTH PMN AND MONONUCLEAR ABUNDANT SQUAMOUS EPITHELIAL CELLS PRESENT FEW GRAM POSITIVE COCCI IN PAIRS RARE GRAM NEGATIVE RODS Performed at [facility]    Culture   Final    Culture reincubated for better growth Performed at [facility]    Report Status PENDING  Incomplete     Studies: No results  found.  Scheduled Meds: . azithromycin  500 mg Oral q1800  . carvedilol  3.125 mg Oral BID WC  . cefTRIAXone (ROCEPHIN)  IV  1 g Intravenous Q24H  . furosemide  40 mg Oral Daily  . heparin  5,000 Units Subcutaneous 3 times per day  . ipratropium-albuterol  3 mL Nebulization Q4H  . sacubitril-valsartan  1 tablet Oral BID   Continuous Infusions: . sodium chloride 10 mL/hr at 10/09/14 1101    Principal Problem:   CAP (community acquired pneumonia) Active Problems:   Obesity (BMI 35.0-39.9 without comorbidity)   Hypertension   Tachycardia   Community acquired pneumonia   Cough   Hemoptysis   Acute systolic heart failure   Respiratory failure, acute    Time spent: 35 mintues    [facility] M  [facility] Pager [phone]. If 7PM-7AM, please contact night-coverage at [email or web address], password [facility] 10/10/2014, 10:10 AM  LOS: 3 days

## 2014-10-10 NOTE — Progress Notes (Signed)
Subjective: He says he feels better. He is still coughing up some purulent material mixed with blood. His breathing is improving.  Objective: Vital signs in last 24 hours: Temp:  [98.2 F (36.8 C)-98.6 F (37 C)] 98.2 F (36.8 C) (06/22 0544) Pulse Rate:  [101-125] 107 (06/22 0544) Resp:  [20-23] 20 (06/22 0544) BP: (138-159)/(72-90) 138/72 mmHg (06/22 0544) SpO2:  [77 %-97 %] 86 % (06/22 0801) FiO2 (%):  [30 %-40 %] 35 % (06/22 0801) Weight:  [159.575 kg (351 lb 12.8 oz)] 159.575 kg (351 lb 12.8 oz) (06/22 0544) Weight change:  Last BM Date: 10/07/14  Intake/Output from previous day: 06/21 0701 - 06/22 0700 In: 687.7 [P.O.:360; I.V.:277.7; IV Piggyback:50] Out: -   PHYSICAL EXAM General appearance: alert, cooperative, mild distress and morbidly obese Resp: rhonchi bilaterally Cardio: regular rate and rhythm, S1, S2 normal, no murmur, click, rub or gallop GI: soft, non-tender; bowel sounds normal; no masses,  no organomegaly Extremities: extremities normal, atraumatic, no cyanosis or edema  Lab Results:  Results for orders placed or performed during the hospital encounter of 10/07/14 (from the past 48 hour(s))  Sedimentation rate     Status: Abnormal   Collection Time: 10/08/14 10:17 AM  Result Value Ref Range   Sed Rate 30 (H) 0 - 16 mm/hr  ANCA Titers     Status: None   Collection Time: 10/08/14 10:17 AM  Result Value Ref Range   C-ANCA <1:20 Neg:<1:20 titer   P-ANCA <1:20 Neg:<1:20 titer    Comment: (NOTE) The presence of positive fluorescence exhibiting P-ANCA or C-ANCA patterns alone is not specific for the diagnosis of Wegener's Granulomatosis (WG) or microscopic polyangiitis. Decisions about treatment should not be based solely on ANCA IFA results.  The International ANCA Group Consensus recommends follow up testing of positive sera with both PR-3 and MPO-ANCA enzyme immunoassays. As many as 5% serum samples are positive only by EIA. Ref. AM J Clin Pathol  1999;111:507-513.    Atypical P-ANCA titer <1:20 Neg:<1:20 titer    Comment: (NOTE) The atypical pANCA pattern has been observed in a significant percentage of patients with ulcerative colitis, primary sclerosing cholangitis and autoimmune hepatitis. Performed At: [facility] [address] [place], [place] [id number] Mila Homer MD OV:[id number]   Mpo/pr-3 (anca) antibodies     Status: None   Collection Time: 10/08/14 10:17 AM  Result Value Ref Range   Myeloperoxidase Abs <9.0 0.0 - 9.0 U/mL   ANCA Proteinase 3 <3.5 0.0 - 3.5 U/mL    Comment: (NOTE) Performed At: [facility] [address] [place], [place] [id number] Mila Homer MD HM:[id number]   C3 complement     Status: None   Collection Time: 10/08/14 10:17 AM  Result Value Ref Range   C3 Complement 143 82 - 167 mg/dL    Comment: (NOTE) Performed At: [facility] [address] [place], [place] [id number] Mila Homer MD YT:[id number]   C4 complement     Status: None   Collection Time: 10/08/14 10:17 AM  Result Value Ref Range   Complement C4, Body Fluid 28 14 - 44 mg/dL    Comment: (NOTE) Performed At: [facility] [address] [place], [place] [id number] Mila Homer MD BS:[id number]   Brain natriuretic peptide     Status: Abnormal   Collection Time: 10/08/14 10:17 AM  Result Value Ref Range   B Natriuretic Peptide 177.0 (H) 0.0 - 100.0 pg/mL  CBC     Status: Abnormal  Collection Time: 10/09/14  6:38 AM  Result Value Ref Range   WBC 12.4 (H) 4.0 - 10.5 K/uL   RBC 4.63 4.22 - 5.81 MIL/uL   Hemoglobin 11.6 (L) 13.0 - 17.0 g/dL   HCT 36.9 (L) 39.0 - 52.0 %   MCV 79.7 78.0 - 100.0 fL   MCH 25.1 (L) 26.0 - 34.0 pg   MCHC 31.4 30.0 - 36.0 g/dL   RDW 15.6 (H) 11.5 - 15.5 %   Platelets 209 150 - 400 K/uL  Basic metabolic panel     Status: Abnormal   Collection Time: 10/09/14  6:38 AM  Result Value Ref Range   Sodium 136 135 - 145 mmol/L   Potassium 3.4  (L) 3.5 - 5.1 mmol/L   Chloride 102 101 - 111 mmol/L   CO2 26 22 - 32 mmol/L   Glucose, Bld 109 (H) 65 - 99 mg/dL   BUN 13 6 - 20 mg/dL   Creatinine, Ser 0.89 0.61 - 1.24 mg/dL   Calcium 8.2 (L) 8.9 - 10.3 mg/dL   GFR calc non Af Amer >60 >60 mL/min   GFR calc Af Amer >60 >60 mL/min    Comment: (NOTE) The eGFR has been calculated using the CKD EPI equation. This calculation has not been validated in all clinical situations. eGFR's persistently <60 mL/min signify possible Chronic Kidney Disease.    Anion gap 8 5 - 15  CBC     Status: Abnormal   Collection Time: 10/10/14  6:28 AM  Result Value Ref Range   WBC 8.8 4.0 - 10.5 K/uL   RBC 4.17 (L) 4.22 - 5.81 MIL/uL   Hemoglobin 10.4 (L) 13.0 - 17.0 g/dL   HCT 33.2 (L) 39.0 - 52.0 %   MCV 79.6 78.0 - 100.0 fL   MCH 24.9 (L) 26.0 - 34.0 pg   MCHC 31.3 30.0 - 36.0 g/dL   RDW 15.5 11.5 - 15.5 %   Platelets 239 150 - 400 K/uL  Basic metabolic panel     Status: Abnormal   Collection Time: 10/10/14  6:28 AM  Result Value Ref Range   Sodium 138 135 - 145 mmol/L   Potassium 3.1 (L) 3.5 - 5.1 mmol/L   Chloride 100 (L) 101 - 111 mmol/L   CO2 29 22 - 32 mmol/L   Glucose, Bld 99 65 - 99 mg/dL   BUN 15 6 - 20 mg/dL   Creatinine, Ser 0.97 0.61 - 1.24 mg/dL   Calcium 8.1 (L) 8.9 - 10.3 mg/dL   GFR calc non Af Amer >60 >60 mL/min   GFR calc Af Amer >60 >60 mL/min    Comment: (NOTE) The eGFR has been calculated using the CKD EPI equation. This calculation has not been validated in all clinical situations. eGFR's persistently <60 mL/min signify possible Chronic Kidney Disease.    Anion gap 9 5 - 15    ABGS No results for input(s): PHART, PO2ART, TCO2, HCO3 in the last 72 hours.  Invalid input(s): PCO2 CULTURES Recent Results (from the past 240 hour(s))  Culture, blood (routine x 2)     Status: None (Preliminary result)   Collection Time: 10/07/14  2:54 PM  Result Value Ref Range Status   Specimen Description BLOOD LEFT ARM  Final    Special Requests BOTTLES DRAWN AEROBIC AND ANAEROBIC 8CC BOTTLES  Final   Culture NO GROWTH 2 DAYS  Final   Report Status PENDING  Incomplete  Culture, blood (routine x 2)  Status: None (Preliminary result)   Collection Time: 10/07/14  2:59 PM  Result Value Ref Range Status   Specimen Description BLOOD RIGHT HAND  Final   Special Requests BOTTLES DRAWN AEROBIC ONLY 8CC BOTTLE  Final   Culture NO GROWTH 2 DAYS  Final   Report Status PENDING  Incomplete  Culture, blood (routine x 2) Call MD if unable to obtain prior to antibiotics being given     Status: None (Preliminary result)   Collection Time: 10/07/14  6:02 PM  Result Value Ref Range Status   Specimen Description BLOOD LEFT ANTECUBITAL  Final   Special Requests BOTTLES DRAWN AEROBIC AND ANAEROBIC 8CC  Final   Culture NO GROWTH 2 DAYS  Final   Report Status PENDING  Incomplete  Culture, blood (routine x 2) Call MD if unable to obtain prior to antibiotics being given     Status: None (Preliminary result)   Collection Time: 10/07/14  6:10 PM  Result Value Ref Range Status   Specimen Description BLOOD LEFT HAND  Final   Special Requests BOTTLES DRAWN AEROBIC AND ANAEROBIC [place]  Final   Culture NO GROWTH 2 DAYS  Final   Report Status PENDING  Incomplete  Culture, respiratory (NON-Expectorated)     Status: None (Preliminary result)   Collection Time: 10/08/14  6:00 AM  Result Value Ref Range Status   Specimen Description SPUTUM BLDY  Final   Special Requests NONE  Final   Gram Stain   Final    FEW WBC PRESENT,BOTH PMN AND MONONUCLEAR ABUNDANT SQUAMOUS EPITHELIAL CELLS PRESENT FEW GRAM POSITIVE COCCI IN PAIRS RARE GRAM NEGATIVE RODS Performed at [facility]    Culture   Final    Culture reincubated for better growth Performed at [facility]    Report Status PENDING  Incomplete   Studies/Results: No results found.  Medications:  Prior to Admission:  Prescriptions prior to admission  Medication Sig  Dispense Refill Last Dose  . ibuprofen (ADVIL,MOTRIN) 200 MG tablet Take 800 mg by mouth every 6 (six) hours as needed for moderate pain.   10/07/2014 at Unknown time  . Pseudoeph-Doxylamine-DM-APAP (DAYQUIL/NYQUIL COLD/FLU RELIEF PO) Take 2 capsules by mouth every 4 (four) hours as needed (sinus congestion).   10/07/2014 at Unknown time   Scheduled: . azithromycin  500 mg Oral q1800  . carvedilol  3.125 mg Oral BID WC  . cefTRIAXone (ROCEPHIN)  IV  1 g Intravenous Q24H  . furosemide  40 mg Oral Daily  . heparin  5,000 Units Subcutaneous 3 times per day  . ipratropium-albuterol  3 mL Nebulization Q4H  . sacubitril-valsartan  1 tablet Oral BID   Continuous: . sodium chloride 10 mL/hr at 10/09/14 1101   CVE:[id number], ipratropium-albuterol, oxyCODONE-acetaminophen, zolpidem  Assesment: He was admitted with community-acquired pneumonia. He had hemoptysis associated with this. He is improving from a respiratory point of view and is still having some bloody sputum but it is mixed with purulent sputum.  He has acute hypoxic respiratory failure. This is improving but he is still requiring oxygen  He has acute systolic heart failure and has a previous history of cardiomyopathy. Cardiology is treating his heart failure Principal Problem:   CAP (community acquired pneumonia) Active Problems:   Obesity (BMI 35.0-39.9 without comorbidity)   Hypertension   Tachycardia   Community acquired pneumonia   Cough   Hemoptysis   Acute systolic heart failure   Respiratory failure, acute    Plan: Continue current treatments. He does seem  to be improving.    LOS: 3 days   Jewelia Bocchino L 10/10/2014, 8:31 AM

## 2014-10-10 NOTE — Progress Notes (Signed)
Pt up and moving around without o2 on ans spo2 89-93% on room air pt still a little bit SOB and rr 24-26 pt has strong dry cough will put back on o2 after treatment and monitor through out the night

## 2014-10-10 NOTE — Progress Notes (Signed)
Pt had o2 off while sleeping and spo2 77% on room air back up 93% with treatment. Nurse informed of finding and instruction of me telling pt of condition of spo2 low and to keep watch...the patient had medicine to help rest

## 2014-10-11 DIAGNOSIS — J9601 Acute respiratory failure with hypoxia: Secondary | ICD-10-CM

## 2014-10-11 LAB — CBC
HCT: 35.8 % — ABNORMAL LOW (ref 39.0–52.0)
HEMOGLOBIN: 11.1 g/dL — AB (ref 13.0–17.0)
MCH: 24.8 pg — AB (ref 26.0–34.0)
MCHC: 31 g/dL (ref 30.0–36.0)
MCV: 80.1 fL (ref 78.0–100.0)
Platelets: 268 10*3/uL (ref 150–400)
RBC: 4.47 MIL/uL (ref 4.22–5.81)
RDW: 15.4 % (ref 11.5–15.5)
WBC: 5.9 10*3/uL (ref 4.0–10.5)

## 2014-10-11 LAB — BASIC METABOLIC PANEL
Anion gap: 8 (ref 5–15)
BUN: 18 mg/dL (ref 6–20)
CALCIUM: 8.3 mg/dL — AB (ref 8.9–10.3)
CO2: 30 mmol/L (ref 22–32)
Chloride: 101 mmol/L (ref 101–111)
Creatinine, Ser: 0.95 mg/dL (ref 0.61–1.24)
GFR calc Af Amer: >60 mL/min (ref >60)
Glucose, Bld: 91 mg/dL (ref 65–99)
POTASSIUM: 3.4 mmol/L — AB (ref 3.5–5.1)
SODIUM: 139 mmol/L (ref 135–145)

## 2014-10-11 LAB — IRON AND TIBC
Iron: 35 ug/dL — ABNORMAL LOW (ref 45–182)
Saturation Ratios: 15 % — ABNORMAL LOW (ref 17.9–39.5)
TIBC: 234 ug/dL — ABNORMAL LOW (ref 250–450)
UIBC: 199 ug/dL

## 2014-10-11 LAB — FERRITIN: FERRITIN: 347 ng/mL — AB (ref 24–336)

## 2014-10-11 LAB — VITAMIN B12: Vitamin B-12: 190 pg/mL (ref 180–914)

## 2014-10-11 LAB — MAGNESIUM: Magnesium: 2.3 mg/dL (ref 1.7–2.4)

## 2014-10-11 LAB — FOLATE: Folate: 10.3 ng/mL (ref >5.9)

## 2014-10-11 LAB — RETICULOCYTES
RBC.: 4.47 MIL/uL (ref 4.22–5.81)
Retic Count, Absolute: 111.8 10*3/uL (ref 19.0–186.0)
Retic Ct Pct: 2.5 % (ref 0.4–3.1)

## 2014-10-11 MED ORDER — POTASSIUM CHLORIDE CRYS ER 20 MEQ PO TBCR
40.0000 meq | EXTENDED_RELEASE_TABLET | Freq: Every day | ORAL | Status: DC
  Administered 2014-10-11 – 2014-10-12 (×2): 40 meq via ORAL
  Filled 2014-10-11 (×2): qty 2

## 2014-10-11 NOTE — Progress Notes (Signed)
SUBJECTIVE: Feels better. Coughing up a little phlegm. Denies chest pain and leg swelling.     Intake/Output Summary (Last 24 hours) at 10/11/14 1105 Last data filed at 10/11/14 0900  Gross per 24 hour  Intake 761.67 ml  Output   1600 ml  Net -838.33 ml    Current Facility-Administered Medications  Medication Dose Route Frequency Provider Last Rate Last Dose  . 0.9 %  sodium chloride infusion   Intravenous Continuous Gwenyth Bender, NP 10 mL/hr at 10/09/14 1101    . azithromycin (ZITHROMAX) tablet 500 mg  500 mg Oral q1800 Erick Blinks, MD   500 mg at 10/10/14 1717  . carvedilol (COREG) tablet 3.125 mg  3.125 mg Oral BID WC Gwenyth Bender, NP   3.125 mg at 10/11/14 0853  . cefTRIAXone (ROCEPHIN) 1 g in dextrose 5 % 50 mL IVPB  1 g Intravenous Q24H Nimish C Gosrani, MD   1 g at 10/10/14 1717  . furosemide (LASIX) tablet 40 mg  40 mg Oral Daily Gwenyth Bender, NP   40 mg at 10/11/14 0853  . guaiFENesin (ROBITUSSIN) 100 MG/5ML solution 200 mg  200 mg Oral Q4H PRN Gwenyth Bender, NP   200 mg at 10/08/14 1251  . heparin injection 5,000 Units  5,000 Units Subcutaneous 3 times per day Wilson Singer, MD   5,000 Units at 10/11/14 1610  . ipratropium-albuterol (DUONEB) 0.5-2.5 (3) MG/3ML nebulizer solution 3 mL  3 mL Nebulization Q4H PRN Nimish C Gosrani, MD   3 mL at 10/08/14 1627  . ipratropium-albuterol (DUONEB) 0.5-2.5 (3) MG/3ML nebulizer solution 3 mL  3 mL Nebulization Q4H Erick Blinks, MD   3 mL at 10/11/14 0731  . oxyCODONE-acetaminophen (PERCOCET/ROXICET) 5-325 MG per tablet 1 tablet  1 tablet Oral Q6H PRN Gwenyth Bender, NP   1 tablet at 10/11/14 0300  . potassium chloride SA (K-DUR,KLOR-CON) CR tablet 40 mEq  40 mEq Oral Daily Standley Brooking, MD   40 mEq at 10/11/14 0853  . sacubitril-valsartan (ENTRESTO) 24-26 mg per tablet  1 tablet Oral BID Laqueta Linden, MD   1 tablet at 10/11/14 0853  . zolpidem (AMBIEN) tablet 5 mg  5 mg Oral QHS PRN Leanne Chang, NP    5 mg at 10/10/14 0133    Filed Vitals:   10/10/14 2000 10/10/14 2300 10/11/14 0624 10/11/14 0733  BP: 132/64  124/84   Pulse: 97  85   Temp: 98.4 F (36.9 C)  98 F (36.7 C)   TempSrc: Oral  Oral   Resp: 24  23   Height:      Weight:   350 lb 12.8 oz (159.122 kg)   SpO2: 96% 91% 100% 90%    PHYSICAL EXAM General: NAD, wearing O2 mask Neck: Difficult to assess JVP, no thyromegaly or thyroid nodule.  Lungs: Diminished sounds b/l, no frank rales or wheezes. CV: Nondisplaced PMI. Regular rate and rhythm, normal S1/S2, no S3/S4. Trace peripheral edema.  Abdomen: Soft, obese, no distention.  Skin: Intact without lesions or rashes.  Neurologic: Alert and oriented x 3.  Psych: Normal affect. Extremities: No clubbing or cyanosis.  HEENT: Normal.   TELEMETRY: Reviewed telemetry pt in sinus rhythm.  LABS: Basic Metabolic Panel:  Recent Labs  96/04/54 0628 10/11/14 0619  NA 138 139  K 3.1* 3.4*  CL 100* 101  CO2 29 30  GLUCOSE 99 91  BUN 15 18  CREATININE 0.97 0.95  CALCIUM 8.1* 8.3*  MG  --  2.3   Liver Function Tests: No results for input(s): AST, ALT, ALKPHOS, BILITOT, PROT, ALBUMIN in the last 72 hours. No results for input(s): LIPASE, AMYLASE in the last 72 hours. CBC:  Recent Labs  10/10/14 0628 10/11/14 0619  WBC 8.8 5.9  HGB 10.4* 11.1*  HCT 33.2* 35.8*  MCV 79.6 80.1  PLT 239 268   Cardiac Enzymes: No results for input(s): CKTOTAL, CKMB, CKMBINDEX, TROPONINI in the last 72 hours. BNP: Invalid input(s): POCBNP D-Dimer: No results for input(s): DDIMER in the last 72 hours. Hemoglobin A1C: No results for input(s): HGBA1C in the last 72 hours. Fasting Lipid Panel: No results for input(s): CHOL, HDL, LDLCALC, TRIG, CHOLHDL, LDLDIRECT in the last 72 hours. Thyroid Function Tests: No results for input(s): TSH, T4TOTAL, T3FREE, THYROIDAB in the last 72 hours.  Invalid input(s): FREET3 Anemia Panel:  Recent Labs  10/11/14 0619  RETICCTPCT  2.5    RADIOLOGY: Dg Chest 2 View  10/07/2014   CLINICAL DATA:  Cough and shortness of breath  EXAM: CHEST  2 VIEW  COMPARISON:  None.  FINDINGS: Mild enlargement of the cardiomediastinal silhouette is noted. Multi lobar patchy airspace opacities are noted with central vascular congestion. No pleural effusion. No acute osseous finding.  IMPRESSION: Cardiomegaly with nonspecific patchy multi lobar airspace opacities. This could represent asymmetric edema but other alveolar filling processes including hemorrhage, infection, pus, or protein could appear similar.   Electronically Signed   By: Christiana Pellant M.D.   On: 10/07/2014 13:35   Ct Angio Chest Pe W/cm &/or Wo Cm  10/07/2014   CLINICAL DATA:  Shortness of breath and hemoptysis. History of cardiomyopathy.  EXAM: CT ANGIOGRAPHY CHEST WITH CONTRAST  TECHNIQUE: Multidetector CT imaging of the chest was performed using the standard protocol during bolus administration of intravenous contrast. Multiplanar CT image reconstructions and MIPs were obtained to evaluate the vascular anatomy.  CONTRAST:  OMNIPAQUE IOHEXOL 350 MG/ML SOLN  COMPARISON:  Chest radiograph October 07, 2014  FINDINGS: There is no demonstrable pulmonary embolus. There is no thoracic aortic aneurysm or dissection.  There is extensive airspace consolidation throughout both lungs with essentially equivalent upper and lower lobe prominence. There is no appreciable interstitial edema. There is slight lower lobe bronchiectasis bilaterally.  Thyroid appears normal. There are small mediastinal lymph nodes but no adenopathy by size criteria. The pericardium is not thickened.  In the visualized upper abdomen, there is hepatic steatosis. Both kidneys show fetal lobulations, an anatomic variant.  There are no blastic or lytic bone lesions.  Review of the MIP images confirms the above findings.  IMPRESSION: No demonstrable pulmonary embolus.  Widespread airspace opacity bilaterally. Major differential  considerations include extensive infectious pneumonia, pulmonary hemorrhage, or extensive noncardiogenic edema such as due to toxin exposure or medication reaction. The appearance is atypical for potential congestive heart failure.  Multiple small mediastinal lymph nodes but no frank adenopathy by size criteria.  There is hepatic steatosis.   Electronically Signed   By: Bretta Bang III M.D.   On: 10/07/2014 16:04      ASSESSMENT AND PLAN: 1. 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 Continue Coreg along with sacubitril and valsartan.  2. Essential HTN: Well controlled on low-dose Coreg and newly initiated sacubitril and valsartan.  3. Community acquired pneumonia: Continue azithromycin and ceftriaxone.  Dispo: No further recommendations. Will sign off. Will arrange for  outpatient follow up with me.  Prentice Docker, M.D., F.A.C.C.

## 2014-10-11 NOTE — Progress Notes (Addendum)
PROGRESS NOTE  Cole Singleton TKW:[id number] DOB: Aug 27, 1975 DOA: 10/07/2014 PCP: No primary care provider on file.  Summary: 68 yom presented with cough and congestion and was admitted for pneumonia based on CT. Low volume hemoptysis was noted. H/o work with inhaling of noxious substances. Treated for presumed CAP and seen by cardiology and pulmonology in consultation.  Assessment/Plan: 1. Acute hypoxic respiratory failure with high flow oxygen requirement. Seems to be rapidly improving at this point, appears comfortable nasal cannula oxygen now. 2. Acute on chronic systolic congestive heart failure, LVEF 20-25 percent, not a new finding (according to the chart he had cardiomyopathy in the past which resolved), possibility related to a viral process. Appears compensated at this point. Cardiology has recommended oral medications and signed off. ESR mildly elevated with normal ANCA and complement. HIV negative. 3. Community acquired pneumonia with hemoptysis. Rapidly improving. No evidence of significant hemoptysis. CT scan abnormal, concerning for pulmonary hemorrhage versus edema versus pneumonia. 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 4. Suspected obesity hypoventilation syndrome. 5. Normocytic anemia. 6. Hypokalemia. Replete. 7. Obesity 8. Hypertension   Overall much improved. Weaning oxygen. Continue antibiotics, can likely go home 6/24. May need home oxygen.  Recommended outpatient sleep study recommended  F/u CXR in 2 weeks  Will need at least 3 months of optimal medical therapy before repeat echocardiogram should be performed to determine whether or not he is an ICD candidate. Continue Coreg, Lasix 40 mg daily, sacubitril and valsartan.  Code Status: full code DVT prophylaxis: heparin Family Communication: none present. Patient alert and expresses understanding. Disposition Plan:  home  Murray Hodgkins, MD  [facility]  Pager [phone] If 7PM-7AM, please contact night-coverage at [email or web address], password [facility] 10/11/2014, 10:41 AM  LOS: 4 days   Consultants: 9. pulmonology  Procedures:  Echo left ventricle: The cavity size was severely dilated. Wall thickness was normal. Systolic function was severely reduced. The estimated ejection fraction was in the range of 20% to 25%. Mitral valve: There was mild to moderate regurgitation. - Left atrium: The atrium was mildly dilated.  Antibiotics:  Rocephin 6/19 >>  Azithromycin 6/19 >>  HPI/Subjective: Restless overnight with some hypoxia documented. Pulmonology felt patient was substantial improvement.   Feeling better today. Ambulating without difficulty. Now on nasal cannula oxygen. Breathing better. Productive cough seems to have resolved, no bleeding.  Objective: Filed Vitals:   10/10/14 2000 10/10/14 2300 10/11/14 0624 10/11/14 0733  BP: 132/64  124/84   Pulse: 97  85   Temp: 98.4 F (36.9 C)  98 F (36.7 C)   TempSrc: Oral  Oral   Resp: 24  23   Height:      Weight:   159.122 kg (350 lb 12.8 oz)   SpO2: 96% 91% 100% 90%    Intake/Output Summary (Last 24 hours) at 10/11/14 1041 Last data filed at 10/11/14 0900  Gross per 24 hour  Intake 761.67 ml  Output   1600 ml  Net -838.33 ml     Filed Weights   10/09/14 0825 10/10/14 0544 10/11/14 0624  Weight: 161.934 kg (357 lb) 159.575 kg (351 lb 12.8 oz) 159.122 kg (350 lb 12.8 oz)    Exam:     Afebrile, vital signs stable, currently on nasal cannula at 4 L General:  Appears calm and comfortable. Appears better today. Cardiovascular: RRR, no m/r/g.   Respiratory: CTA bilaterally, no w/r/r. Decreased breath sounds. Normal respiratory effort. Speaks in full senses.  Psychiatric: grossly normal mood and affect, speech fluent and appropriate  New data reviewed:  UOP 1000, I/O not strictly recorded  Basic metabolic panel  unremarkable except for potassium 3.4  CBC stable, no leukocytosis  Pertinent data since admission:  HIV nonreactive  CT chest 6/19: No PE. Widespread airspace opacity bilaterally with broad differential including pneumonia, hemorrhage, noncardiogenic edema secondary to toxin exposure medication reaction, atypical appearance for CHF. Hepatic steatosis.  Respiratory culture: normal flora.  Pending data:  Blood cultures, no growth to date  Scheduled Meds: . azithromycin  500 mg Oral q1800  . carvedilol  3.125 mg Oral BID WC  . cefTRIAXone (ROCEPHIN)  IV  1 g Intravenous Q24H  . furosemide  40 mg Oral Daily  . heparin  5,000 Units Subcutaneous 3 times per day  . ipratropium-albuterol  3 mL Nebulization Q4H  . potassium chloride  40 mEq Oral Daily  . sacubitril-valsartan  1 tablet Oral BID   Continuous Infusions: . sodium chloride 10 mL/hr at 10/09/14 1101    Principal Problem:   CAP (community acquired pneumonia) Active Problems:   Obesity (BMI 35.0-39.9 without comorbidity)   Hypertension   Acute systolic heart failure   Anemia   Hypokalemia   Acute respiratory failure with hypoxia   Time spent 15 minutes

## 2014-10-11 NOTE — Progress Notes (Signed)
Subjective: He says he feels better. His breathing is better. He is still coughing but not as much and is not coughing up blood now. He was able to maintain his oxygen saturation last night without oxygen. This morning he is back on mask oxygen but he says that's because he can't use a nasal cannula.  Objective: Vital signs in last 24 hours: Temp:  [97.8 F (36.6 C)-98.4 F (36.9 C)] 98 F (36.7 C) (06/23 0624) Pulse Rate:  [85-97] 85 (06/23 0624) Resp:  [20-24] 23 (06/23 0624) BP: (113-132)/(64-84) 124/84 mmHg (06/23 0624) SpO2:  [90 %-100 %] 90 % (06/23 0733) FiO2 (%):  [40 %-45 %] 45 % (06/22 2000) Weight:  [159.122 kg (350 lb 12.8 oz)] 159.122 kg (350 lb 12.8 oz) (06/23 0624) Weight change: -2.812 kg (-6 lb 3.2 oz) Last BM Date: 10/07/14  Intake/Output from previous day: 06/22 0701 - 06/23 0700 In: 1001.7 [P.O.:720; I.V.:231.7; IV Piggyback:50] Out: 1000 [Urine:1000]  PHYSICAL EXAM General appearance: alert, cooperative, mild distress and morbidly obese Resp: clear to auscultation bilaterally Cardio: regular rate and rhythm, S1, S2 normal, no murmur, click, rub or gallop GI: soft, non-tender; bowel sounds normal; no masses,  no organomegaly Extremities: extremities normal, atraumatic, no cyanosis or edema  Lab Results:  Results for orders placed or performed during the hospital encounter of 10/07/14 (from the past 48 hour(s))  Mycoplasma pneumoniae antibody, IgM     Status: None   Collection Time: 10/09/14  1:38 PM  Result Value Ref Range   Mycoplasma pneumo IgM 89 <770 U/mL    Comment: (NOTE) Reference Range:    <770 U/ml    Negative 770-950 U/mL    Low positive    >950 U/mL    Positive Performed at [facility]   ANCA [place]     Status: None   Collection Time: 10/09/14  1:38 PM  Result Value Ref Range   C-ANCA <1:20 Neg:<1:20 titer   P-ANCA <1:20 Neg:<1:20 titer    Comment: (NOTE) The presence of positive fluorescence exhibiting P-ANCA or  C-ANCA patterns alone is not specific for the diagnosis of Wegener's Granulomatosis (WG) or microscopic polyangiitis. Decisions about treatment should not be based solely on ANCA IFA results.  The International ANCA Group Consensus recommends follow up testing of positive sera with both PR-3 and MPO-ANCA enzyme immunoassays. As many as 5% serum samples are positive only by EIA. Ref. AM J Clin Pathol 1999;111:507-513.    Atypical P-ANCA titer <1:20 Neg:<1:20 titer    Comment: (NOTE) The atypical pANCA pattern has been observed in a significant percentage of patients with ulcerative colitis, primary sclerosing cholangitis and autoimmune hepatitis. Performed At: [facility] [place], [place] [id number] Lindon Romp MD [id number]   Mpo/pr-3 (anca) antibodies     Status: None   Collection Time: 10/09/14  1:38 PM  Result Value Ref Range   Myeloperoxidase Abs <9.0 0.0 - 9.0 U/mL   ANCA Proteinase 3 <3.5 0.0 - 3.5 U/mL    Comment: (NOTE) Performed At: [facility] [place], [place] [id number] Lindon Romp MD [id number]   CBC     Status: Abnormal   Collection Time: 10/10/14  6:28 AM  Result Value Ref Range   WBC 8.8 4.0 - 10.5 K/uL   RBC 4.17 (L) 4.22 - 5.81 MIL/uL   Hemoglobin 10.4 (L) 13.0 - 17.0 g/dL   HCT 33.2 (L) 39.0 - 52.0 %   MCV 79.6 78.0 - 100.0 fL  MCH 24.9 (L) 26.0 - 34.0 pg   MCHC 31.3 30.0 - 36.0 g/dL   RDW 15.5 11.5 - 15.5 %   Platelets 239 150 - 400 K/uL  Basic metabolic panel     Status: Abnormal   Collection Time: 10/10/14  6:28 AM  Result Value Ref Range   Sodium 138 135 - 145 mmol/L   Potassium 3.1 (L) 3.5 - 5.1 mmol/L   Chloride 100 (L) 101 - 111 mmol/L   CO2 29 22 - 32 mmol/L   Glucose, Bld 99 65 - 99 mg/dL   BUN 15 6 - 20 mg/dL   Creatinine, Ser 0.97 0.61 - 1.24 mg/dL   Calcium 8.1 (L) 8.9 - 10.3 mg/dL   GFR calc non Af Amer >60 >60 mL/min   GFR calc Af Amer >60 >60 mL/min    Comment:  (NOTE) The eGFR has been calculated using the CKD EPI equation. This calculation has not been validated in all clinical situations. eGFR's persistently <60 mL/min signify possible Chronic Kidney Disease.    Anion gap 9 5 - 15  Basic metabolic panel     Status: Abnormal   Collection Time: 10/11/14  6:19 AM  Result Value Ref Range   Sodium 139 135 - 145 mmol/L   Potassium 3.4 (L) 3.5 - 5.1 mmol/L   Chloride 101 101 - 111 mmol/L   CO2 30 22 - 32 mmol/L   Glucose, Bld 91 65 - 99 mg/dL   BUN 18 6 - 20 mg/dL   Creatinine, Ser 0.95 0.61 - 1.24 mg/dL   Calcium 8.3 (L) 8.9 - 10.3 mg/dL   GFR calc non Af Amer >60 >60 mL/min   GFR calc Af Amer >60 >60 mL/min    Comment: (NOTE) The eGFR has been calculated using the CKD EPI equation. This calculation has not been validated in all clinical situations. eGFR's persistently <60 mL/min signify possible Chronic Kidney Disease.    Anion gap 8 5 - 15  Magnesium     Status: None   Collection Time: 10/11/14  6:19 AM  Result Value Ref Range   Magnesium 2.3 1.7 - 2.4 mg/dL  Reticulocytes     Status: None   Collection Time: 10/11/14  6:19 AM  Result Value Ref Range   Retic Ct Pct 2.5 0.4 - 3.1 %   RBC. 4.47 4.22 - 5.81 MIL/uL   Retic Count, Manual 111.8 19.0 - 186.0 K/uL  CBC     Status: Abnormal   Collection Time: 10/11/14  6:19 AM  Result Value Ref Range   WBC 5.9 4.0 - 10.5 K/uL   RBC 4.47 4.22 - 5.81 MIL/uL   Hemoglobin 11.1 (L) 13.0 - 17.0 g/dL   HCT 35.8 (L) 39.0 - 52.0 %   MCV 80.1 78.0 - 100.0 fL   MCH 24.8 (L) 26.0 - 34.0 pg   MCHC 31.0 30.0 - 36.0 g/dL   RDW 15.4 11.5 - 15.5 %   Platelets 268 150 - 400 K/uL    ABGS No results for input(s): PHART, PO2ART, TCO2, HCO3 in the last 72 hours.  Invalid input(s): PCO2 CULTURES Recent Results (from the past 240 hour(s))  Culture, blood (routine x 2)     Status: None (Preliminary result)   Collection Time: 10/07/14  2:54 PM  Result Value Ref Range Status   Specimen Description  BLOOD LEFT ARM  Final   Special Requests BOTTLES DRAWN AEROBIC AND ANAEROBIC 8CC BOTTLES  Final   Culture NO GROWTH 3 DAYS  Final   Report Status PENDING  Incomplete  Culture, blood (routine x 2)     Status: None (Preliminary result)   Collection Time: 10/07/14  2:59 PM  Result Value Ref Range Status   Specimen Description BLOOD RIGHT HAND  Final   Special Requests BOTTLES DRAWN AEROBIC ONLY 8CC BOTTLE  Final   Culture NO GROWTH 3 DAYS  Final   Report Status PENDING  Incomplete  Culture, blood (routine x 2) Call MD if unable to obtain prior to antibiotics being given     Status: None (Preliminary result)   Collection Time: 10/07/14  6:02 PM  Result Value Ref Range Status   Specimen Description BLOOD LEFT ANTECUBITAL  Final   Special Requests BOTTLES DRAWN AEROBIC AND ANAEROBIC 8CC  Final   Culture NO GROWTH 3 DAYS  Final   Report Status PENDING  Incomplete  Culture, blood (routine x 2) Call MD if unable to obtain prior to antibiotics being given     Status: None (Preliminary result)   Collection Time: 10/07/14  6:10 PM  Result Value Ref Range Status   Specimen Description BLOOD LEFT HAND  Final   Special Requests BOTTLES DRAWN AEROBIC AND ANAEROBIC [place]  Final   Culture NO GROWTH 3 DAYS  Final   Report Status PENDING  Incomplete  Culture, respiratory (NON-Expectorated)     Status: None   Collection Time: 10/08/14  6:00 AM  Result Value Ref Range Status   Specimen Description SPUTUM BLDY  Final   Special Requests NONE  Final   Gram Stain   Final    FEW WBC PRESENT,BOTH PMN AND MONONUCLEAR ABUNDANT SQUAMOUS EPITHELIAL CELLS PRESENT FEW GRAM POSITIVE COCCI IN PAIRS RARE GRAM NEGATIVE RODS Performed at [facility]    Culture   Final    NORMAL OROPHARYNGEAL FLORA Performed at [facility]    Report Status 10/10/2014 FINAL  Final   Studies/Results: No results found.  Medications:  Prior to Admission:  Prescriptions prior to admission  Medication Sig Dispense  Refill Last Dose  . ibuprofen (ADVIL,MOTRIN) 200 MG tablet Take 800 mg by mouth every 6 (six) hours as needed for moderate pain.   10/07/2014 at Unknown time  . Pseudoeph-Doxylamine-DM-APAP (DAYQUIL/NYQUIL COLD/FLU RELIEF PO) Take 2 capsules by mouth every 4 (four) hours as needed (sinus congestion).   10/07/2014 at Unknown time   Scheduled: . azithromycin  500 mg Oral q1800  . carvedilol  3.125 mg Oral BID WC  . cefTRIAXone (ROCEPHIN)  IV  1 g Intravenous Q24H  . furosemide  40 mg Oral Daily  . heparin  5,000 Units Subcutaneous 3 times per day  . ipratropium-albuterol  3 mL Nebulization Q4H  . potassium chloride  40 mEq Oral Daily  . sacubitril-valsartan  1 tablet Oral BID   Continuous: . sodium chloride 10 mL/hr at 10/09/14 1101   OVZ:CHYIFOYDXAJ, ipratropium-albuterol, oxyCODONE-acetaminophen, zolpidem  Assesment: He was admitted with community-acquired pneumonia and this seems to be improving substantially. He had acute hypoxic respiratory failure. He may have some element of obesity/hypoventilation because he seems to have more trouble with oxygenation at night.  He has recurrent heart failure. Principal Problem:   CAP (community acquired pneumonia) Active Problems:   Obesity (BMI 35.0-39.9 without comorbidity)   Hypertension   Tachycardia   Community acquired pneumonia   Cough   Hemoptysis   Acute systolic heart failure   Respiratory failure, acute   Anemia   Hypokalemia   Hypoxia    Plan: From  the point of view of purely his pneumonia I think he is approaching discharge. The oxygen use may be a problem because of his difficulty using a nasal cannula. I think he will need a sleep study as an outpatient if he's not had that done. He needs chest x-ray for follow-up  in about 2 weeks.    LOS: 4 days   Janelie Goltz L 10/11/2014, 8:48 AM

## 2014-10-12 MED ORDER — CEFUROXIME AXETIL 500 MG PO TABS: 500.0000 mg | ORAL_TABLET | Freq: Two times a day (BID) | ORAL | Status: DC

## 2014-10-12 MED ORDER — FUROSEMIDE 40 MG PO TABS: 40.0000 mg | ORAL_TABLET | Freq: Every day | ORAL | Status: DC

## 2014-10-12 MED ORDER — CARVEDILOL 3.125 MG PO TABS: 3.1250 mg | ORAL_TABLET | Freq: Two times a day (BID) | ORAL | Status: DC

## 2014-10-12 MED ORDER — SACUBITRIL-VALSARTAN 24-26 MG PO TABS: 1.0000 | ORAL_TABLET | Freq: Two times a day (BID) | ORAL | Status: DC

## 2014-10-12 MED ORDER — POTASSIUM CHLORIDE CRYS ER 20 MEQ PO TBCR: 40.0000 meq | EXTENDED_RELEASE_TABLET | Freq: Every day | ORAL | Status: DC

## 2014-10-12 NOTE — Progress Notes (Signed)
Patient with orders to be discharge home. Discharge instructions given, patient verbalized understanding. Prescriptions given. Patient stable. Patient left in private vehicle.  

## 2014-10-12 NOTE — Progress Notes (Signed)
PROGRESS NOTE  Cole Singleton YSA:[id number] DOB: [birth year] DOA: 10/07/2014 PCP: No primary care provider on file.  Has appointment with [facility]  Summary: 6 yom presented with cough and congestion and was admitted for pneumonia based on CT. Low volume hemoptysis was noted. H/o work with inhaling of noxious substances. Treated for presumed CAP and seen by cardiology and pulmonology in consultation.  Assessment/Plan: 1. Acute hypoxic respiratory failure with high flow oxygen requirement. Resolved. Now off oxygen. 2. Community acquired pneumonia with hemoptysis. Continues to improve. No hemoptysis. Now off oxygen. CT scan abnormal, has improved with treatment aimed at pneumonia. 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 3. Acute on chronic systolic congestive heart failure, LVEF 20-25 percent, not a new finding (according to the chart he had cardiomyopathy in the past which resolved), possibility related to a viral process. Stable. Cardiology has recommended oral medications and signed off. ESR mildly elevated with normal ANCA and complement. HIV negative. 4. Suspected obesity hypoventilation syndrome. 5. Normocytic anemia. Stable.  6. Obesity 7. Hypertension. Stable.    Continues to improve, plan home today, complete oral abx.  Consider outpatient sleep study recommended  Recommend f/u CXR in 2-3 weeks  Continue Coreg, Lasix 40 mg daily, sacubitril and valsartan (CM has provided information for one month supply). Will need at least 3 months of optimal medical therapy before repeat echocardiogram should be performed to determine whether or not he is an ICD candidate.   Code Status: full code DVT prophylaxis: heparin Family Communication: none present. Patient alert and expresses understanding. Disposition Plan: home  Murray Hodgkins, MD  [facility]  Pager [phone] If  7PM-7AM, please contact night-coverage at [email or web address], password [facility] 10/12/2014, 10:02 AM  LOS: 5 days   Consultants:  pulmonology  Procedures:  Echo left ventricle: The cavity size was severely dilated. Wall thickness was normal. Systolic function was severely reduced. The estimated ejection fraction was in the range of 20% to 25%. Mitral valve: There was mild to moderate regurgitation. - Left atrium: The atrium was mildly dilated.  Antibiotics:  Rocephin 6/19 >> 6/23  Ceftin 6/23 >> 6/28  Azithromycin 6/19 >> 6/23  HPI/Subjective: Cardiology signed off 6/23.  Feels better, breathing better. Off oxygen. Still some cough, no blood. Wants to go home. Ambulating without difficulty.  Objective: Filed Vitals:   10/11/14 2145 10/12/14 0418 10/12/14 0420 10/12/14 0422  BP: 132/91   136/93  Pulse: 92 91  92  Temp: 98.4 F (36.9 C) 98.4 F (36.9 C)    TempSrc: Oral Oral    Resp: 20 20    Height:      Weight:   158.85 kg (350 lb 3.2 oz)   SpO2: 96% 99%      Intake/Output Summary (Last 24 hours) at 10/12/14 1002 Last data filed at 10/11/14 1700  Gross per 24 hour  Intake    480 ml  Output      0 ml  Net    480 ml     Filed Weights   10/10/14 0544 10/11/14 0624 10/12/14 0420  Weight: 159.575 kg (351 lb 12.8 oz) 159.122 kg (350 lb 12.8 oz) 158.85 kg (350 lb 3.2 oz)    Exam:    Afebrile, VSS, no hypoxia General:  Appears comfortable, calm. Cardiovascular: Regular rate and rhythm, no murmur, rub or gallop. 1+ bilateral lower extremity edema. Telemetry: Sinus rhythm, no arrhythmias  Respiratory: Clear to auscultation bilaterally, no wheezes, rales or  rhonchi. Normal respiratory effort. Musculoskeletal: grossly normal tone bilateral upper and lower extremities Psychiatric: grossly normal mood and affect, speech fluent and appropriate  New data reviewed:  UOP 600   Pertinent data since admission:  HIV nonreactive  CT chest 6/19: No PE. Widespread  airspace opacity bilaterally with broad differential including pneumonia, hemorrhage, noncardiogenic edema secondary to toxin exposure medication reaction, atypical appearance for CHF. Hepatic steatosis.  Respiratory culture: normal flora.  Pending data:  Blood cultures, no growth to date  Scheduled Meds: . azithromycin  500 mg Oral q1800  . carvedilol  3.125 mg Oral BID WC  . cefTRIAXone (ROCEPHIN)  IV  1 g Intravenous Q24H  . furosemide  40 mg Oral Daily  . heparin  5,000 Units Subcutaneous 3 times per day  . potassium chloride  40 mEq Oral Daily  . sacubitril-valsartan  1 tablet Oral BID   Continuous Infusions: . sodium chloride 10 mL/hr at 10/09/14 1101    Principal Problem:   CAP (community acquired pneumonia) Active Problems:   Obesity (BMI 35.0-39.9 without comorbidity)   Hypertension   Acute systolic heart failure   Anemia   Hypokalemia   Acute respiratory failure with hypoxia

## 2014-10-12 NOTE — Care Management Note (Signed)
Case Management Note  Patient Details  Name: Verlan Henschel MRN: [id number] Date of Birth: 09/18/1975  Subjective/Objective:                    Action/Plan:   Expected Discharge Date:                  Expected Discharge Plan:  Home/Self Care  In-House Referral:  Financial Counselor  Discharge planning Services  Follow-up appt scheduled, CM Consult, MATCH Program  Post Acute Care Choice:  NA Choice offered to:  NA  DME Arranged:    DME Agency:     HH Arranged:    HH Agency:     Status of Service:  Completed, signed off  Medicare Important Message Given:    Date Medicare IM Given:    Medicare IM give by:    Date Additional Medicare IM Given:    Additional Medicare Important Message give by:     If discussed at Long Length of Stay Meetings, dates discussed:    Additional Comments: Pt discharged home today. Pt has free drug card for month supply of Entresto. Pt also has started the financial application for medication assistance for Entresto.Pt is aware that pharmacy may have to order the medication. Pt given MATCh for the other medications at discharge. Pt given set of scales. Pt has follow up appts made. No DME needs noted. Pt and pts nurse aware of discharge arrangements. Arlyss Queen [place], RN 10/12/2014, 11:00 AM

## 2014-10-12 NOTE — Discharge Summary (Signed)
Physician Discharge Summary  Cole Singleton DGL:[id number] DOB: Aug 29, 1975 DOA: 10/07/2014  PCP: No primary care provider on file. Has appointment with [facility]  Admit date: 10/07/2014 Discharge date: 10/12/2014  Recommendations for Outpatient Follow-up:  1. Follow-up resolution of pneumonia. Current follow-up chest x-ray 2-3 weeks to ensure resolution. 2. Consider outpatient sleep study 3. Continue Coreg, Lasix 40 mg daily, sacubitril and valsartan (CM has provided information for one month supply). Will need at least 3 months of optimal medical therapy before repeat echocardiogram should be performed to determine whether or not he is an ICD candidate.   Follow-up Information    Follow up with Jory Sims, NP On 10/29/2014.   Specialties:  Nurse Practitioner, Radiology, Cardiology   Why:  APPT WITH [place] [phone] AT 1:50   Contact information:   [address] [place] [address] [phone]       Follow up with [facility] On 5/57/3220.   Specialty:  Occupational Therapy   Why:  at 8:30   Contact information:   [address] [place] [address] [phone]      Discharge Diagnoses:  1. Acute hypoxic respiratory failure 2. Community acquired pneumonia with hemoptysis 3. Acute on chronic systolic congestive heart failure 4. Suspected obesity hypoventilation syndrome 5. Normocytic anemia 6. Obesity  Discharge Condition: improved Disposition: home  Diet recommendation: heart healthy  Filed Weights   10/10/14 0544 10/11/14 0624 10/12/14 0420  Weight: 159.575 kg (351 lb 12.8 oz) 159.122 kg (350 lb 12.8 oz) 158.85 kg (350 lb 3.2 oz)    History of present illness:  80 yom presented with cough and congestion and was admitted for pneumonia based on CT. Low volume hemoptysis was noted. H/o work with inhaling of noxious substances.  Hospital Course:  Cole Singleton was treated for presumed  community-acquired pneumonia, acute heart failure and seen by cardiology and pulmonology during his hospitalization. He gradually improved with resolution of hemoptysis and hypoxic respiratory failure at the time of discharge. Recommendations were to complete antibiotics and follow-up with repeat chest x-ray in the outpatient setting. In regard to his heart failure he started on new medications with recommendations for outpatient follow-up. See individual issues below.   Acute hypoxic respiratory failure with high flow oxygen requirement. Resolved. Now off oxygen.  Community acquired pneumonia with hemoptysis. Continues to improve. No hemoptysis. Now off oxygen. CT scan abnormal, has improved with treatment aimed at pneumonia. 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  Acute on chronic systolic congestive heart failure, LVEF 20-25 percent, not a new finding (according to the chart he had cardiomyopathy in the past which resolved), possibility related to a viral process. Stable. Cardiology has recommended oral medications and signed off. ESR mildly elevated with normal ANCA and complement. HIV negative.  Suspected obesity hypoventilation syndrome.  Normocytic anemia. Stable.   Obesity  Hypertension. Stable.  Consultants:  pulmonology  Procedures:  Echo left ventricle: The cavity size was severely dilated. Wall thickness was normal. Systolic function was severely reduced. The estimated ejection fraction was in the range of 20% to 25%. Mitral valve: There was mild to moderate regurgitation. - Left atrium: The atrium was mildly dilated.  Antibiotics:  Rocephin 6/19 >> 6/23  Ceftin 6/23 >> 6/28  Azithromycin 6/19 >> 6/23  Discharge Instructions  Discharge Instructions    (HEART FAILURE PATIENTS) Call MD:  Anytime you have any of the  following symptoms: 1) 3 pound weight gain in 24 hours or 5  pounds in 1 week 2) shortness of breath, with or without a dry hacking cough 3) swelling in the hands, feet or stomach 4) if you have to sleep on extra pillows at night in order to breathe.    Complete by:  As directed      Activity as tolerated - No restrictions    Complete by:  As directed      Diet - low sodium heart healthy    Complete by:  As directed      Discharge instructions    Complete by:  As directed   Call your physician or seek immediate medical attention for shortness of breath, increased weight, weight gain, swelling, pain, bleeding or worsening of condition.          Current Discharge Medication List    START taking these medications   Details  carvedilol (COREG) 3.125 MG tablet Take 1 tablet (3.125 mg total) by mouth 2 (two) times daily with a meal. Qty: 60 tablet, Refills: 0    cefUROXime (CEFTIN) 500 MG tablet Take 1 tablet (500 mg total) by mouth 2 (two) times daily with a meal. Qty: 9 tablet, Refills: 0    furosemide (LASIX) 40 MG tablet Take 1 tablet (40 mg total) by mouth daily. Qty: 30 tablet, Refills: 0    potassium chloride SA (K-DUR,KLOR-CON) 20 MEQ tablet Take 2 tablets (40 mEq total) by mouth daily. Qty: 60 tablet, Refills: 0    sacubitril-valsartan (ENTRESTO) 24-26 MG Take 1 tablet by mouth 2 (two) times daily. Qty: 60 tablet, Refills: 0      STOP taking these medications     ibuprofen (ADVIL,MOTRIN) 200 MG tablet      Pseudoeph-Doxylamine-DM-APAP (DAYQUIL/NYQUIL COLD/FLU RELIEF PO)        Allergies  Allergen Reactions  . Codeine Other (See Comments)    Severe headache Pt reports being able to take percocet & vicodin    The results of significant diagnostics from this hospitalization (including imaging, microbiology, ancillary and laboratory) are listed below for reference.    Significant Diagnostic Studies: Dg Chest 2 View  10/07/2014   CLINICAL DATA:  Cough and shortness of breath  EXAM: CHEST  2 VIEW  COMPARISON:  None.  FINDINGS:  Mild enlargement of the cardiomediastinal silhouette is noted. Multi lobar patchy airspace opacities are noted with central vascular congestion. No pleural effusion. No acute osseous finding.  IMPRESSION: Cardiomegaly with nonspecific patchy multi lobar airspace opacities. This could represent asymmetric edema but other alveolar filling processes including hemorrhage, infection, pus, or protein could appear similar.   Electronically Signed   By: Conchita Paris M.D.   On: 10/07/2014 13:35   Ct Angio Chest Pe W/cm &/or Wo Cm  10/07/2014   CLINICAL DATA:  Shortness of breath and hemoptysis. History of cardiomyopathy.  EXAM: CT ANGIOGRAPHY CHEST WITH CONTRAST  TECHNIQUE: Multidetector CT imaging of the chest was performed using the standard protocol during bolus administration of intravenous contrast. Multiplanar CT image reconstructions and MIPs were obtained to evaluate the vascular anatomy.  CONTRAST:  152m OMNIPAQUE IOHEXOL 350 MG/ML SOLN  COMPARISON:  Chest radiograph October 07, 2014  FINDINGS: There is no demonstrable pulmonary embolus. There is no thoracic aortic aneurysm or dissection.  There is extensive airspace consolidation throughout both lungs with essentially equivalent upper and lower lobe prominence. There is no appreciable interstitial edema. There is slight lower lobe bronchiectasis bilaterally.  Thyroid appears normal.  There are small mediastinal lymph nodes but no adenopathy by size criteria. The pericardium is not thickened.  In the visualized upper abdomen, there is hepatic steatosis. Both kidneys show fetal lobulations, an anatomic variant.  There are no blastic or lytic bone lesions.  Review of the MIP images confirms the above findings.  IMPRESSION: No demonstrable pulmonary embolus.  Widespread airspace opacity bilaterally. Major differential considerations include extensive infectious pneumonia, pulmonary hemorrhage, or extensive noncardiogenic edema such as due to toxin exposure or  medication reaction. The appearance is atypical for potential congestive heart failure.  Multiple small mediastinal lymph nodes but no frank adenopathy by size criteria.  There is hepatic steatosis.   Electronically Signed   By: Lowella Grip III M.D.   On: 10/07/2014 16:04    Microbiology: Recent Results (from the past 240 hour(s))  Culture, blood (routine x 2)     Status: None (Preliminary result)   Collection Time: 10/07/14  2:54 PM  Result Value Ref Range Status   Specimen Description BLOOD LEFT ARM  Final   Special Requests BOTTLES DRAWN AEROBIC AND ANAEROBIC 8CC BOTTLES  Final   Culture NO GROWTH 4 DAYS  Final   Report Status PENDING  Incomplete  Culture, blood (routine x 2)     Status: None (Preliminary result)   Collection Time: 10/07/14  2:59 PM  Result Value Ref Range Status   Specimen Description BLOOD RIGHT HAND  Final   Special Requests BOTTLES DRAWN AEROBIC ONLY 8CC BOTTLE  Final   Culture NO GROWTH 4 DAYS  Final   Report Status PENDING  Incomplete  Culture, blood (routine x 2) Call MD if unable to obtain prior to antibiotics being given     Status: None (Preliminary result)   Collection Time: 10/07/14  6:02 PM  Result Value Ref Range Status   Specimen Description BLOOD LEFT ANTECUBITAL  Final   Special Requests BOTTLES DRAWN AEROBIC AND ANAEROBIC 8CC  Final   Culture NO GROWTH 4 DAYS  Final   Report Status PENDING  Incomplete  Culture, blood (routine x 2) Call MD if unable to obtain prior to antibiotics being given     Status: None (Preliminary result)   Collection Time: 10/07/14  6:10 PM  Result Value Ref Range Status   Specimen Description BLOOD LEFT HAND  Final   Special Requests BOTTLES DRAWN AEROBIC AND ANAEROBIC [place]  Final   Culture NO GROWTH 4 DAYS  Final   Report Status PENDING  Incomplete  Culture, respiratory (NON-Expectorated)     Status: None   Collection Time: 10/08/14  6:00 AM  Result Value Ref Range Status   Specimen Description SPUTUM BLDY  Final    Special Requests NONE  Final   Gram Stain   Final    FEW WBC PRESENT,BOTH PMN AND MONONUCLEAR ABUNDANT SQUAMOUS EPITHELIAL CELLS PRESENT FEW GRAM POSITIVE COCCI IN PAIRS RARE GRAM NEGATIVE RODS Performed at [facility]    Culture   Final    NORMAL OROPHARYNGEAL FLORA Performed at [facility]    Report Status 10/10/2014 FINAL  Final     Labs: Basic Metabolic Panel:  Recent Labs Lab 10/07/14 1248 10/07/14 1802 10/08/14 0508 10/09/14 0638 10/10/14 0628 10/11/14 0619  NA 137  --  138 136 138 139  K 3.0*  --  3.3* 3.4* 3.1* 3.4*  CL 103  --  105 102 100* 101  CO2 24  --  _0 GLUCOSE 101*  --  97  109* 99 91  BUN 14  --  _0 CREATININE 0.98 1.03 1.01 0.89 0.97 0.95  CALCIUM 8.6*  --  8.0* 8.2* 8.1* 8.3*  MG  --   --  1.8  --   --  2.3   Liver Function Tests:  Recent Labs Lab 10/07/14 1248 10/08/14 0508  AST 24 24  ALT 32 31  ALKPHOS 60 51  BILITOT 1.2 1.2  PROT 7.2 6.7  ALBUMIN 3.7 3.3*   CBC:  Recent Labs Lab 10/07/14 1248 10/07/14 1802 10/08/14 0508 10/09/14 0638 10/10/14 0628 10/11/14 0619  WBC 14.0* 12.9* 10.9* 12.4* 8.8 5.9  NEUTROABS 13.0*  --   --   --   --   --   HGB 13.3 12.5* 12.1* 11.6* 10.4* 11.1*  HCT 40.4 38.5* 37.6* 36.9* 33.2* 35.8*  MCV 77.7* 77.8* 79.0 79.7 79.6 80.1  PLT 249 255 230 209 239 268     Recent Labs  10/07/14 1535 10/08/14 1017  BNP 204.0* 177.0*    CBG:  Recent Labs Lab 10/08/14 0745  GLUCAP 49    Principal Problem:   CAP (community acquired pneumonia) Active Problems:   Obesity (BMI 35.0-39.9 without comorbidity)   Hypertension   Acute systolic heart failure   Anemia   Hypokalemia   Acute respiratory failure with hypoxia   Time coordinating discharge: 35 minutes  Signed:  Murray Hodgkins, MD [facility] 10/12/2014, 10:35 AM

## 2014-10-15 LAB — CULTURE, BLOOD (ROUTINE X 2)
CULTURE: NO GROWTH
CULTURE: NO GROWTH
Culture: NO GROWTH
Culture: NO GROWTH

## 2014-10-16 LAB — LEGIONELLA PNEUMOPHILA TOTAL AB: Serogroup 1: <1:16 {titer}

## 2014-11-28 ENCOUNTER — Encounter: Payer: Self-pay | Admitting: Cardiovascular Disease

## 2014-11-28 ENCOUNTER — Ambulatory Visit: Payer: Self-pay | Admitting: Cardiovascular Disease

## 2015-02-09 ENCOUNTER — Encounter (HOSPITAL_COMMUNITY): Payer: Self-pay | Admitting: *Deleted

## 2015-02-09 ENCOUNTER — Emergency Department (HOSPITAL_COMMUNITY): Payer: Medicaid Other

## 2015-02-09 ENCOUNTER — Emergency Department (HOSPITAL_COMMUNITY)
Admission: EM | Admit: 2015-02-09 | Discharge: 2015-02-09 | Disposition: A | Discharge status: Home / Self Care | Payer: Medicaid Other | Attending: Emergency Medicine | Admitting: Emergency Medicine

## 2015-02-09 DIAGNOSIS — J189 Pneumonia, unspecified organism: Secondary | ICD-10-CM

## 2015-02-09 DIAGNOSIS — J159 Unspecified bacterial pneumonia: Secondary | ICD-10-CM | POA: Insufficient documentation

## 2015-02-09 DIAGNOSIS — I509 Heart failure, unspecified: Secondary | ICD-10-CM | POA: Insufficient documentation

## 2015-02-09 DIAGNOSIS — Z8701 Personal history of pneumonia (recurrent): Secondary | ICD-10-CM | POA: Insufficient documentation

## 2015-02-09 DIAGNOSIS — Z79899 Other long term (current) drug therapy: Secondary | ICD-10-CM | POA: Insufficient documentation

## 2015-02-09 DIAGNOSIS — R600 Localized edema: Secondary | ICD-10-CM | POA: Insufficient documentation

## 2015-02-09 DIAGNOSIS — Z7982 Long term (current) use of aspirin: Secondary | ICD-10-CM | POA: Insufficient documentation

## 2015-02-09 DIAGNOSIS — I10 Essential (primary) hypertension: Secondary | ICD-10-CM | POA: Insufficient documentation

## 2015-02-09 DIAGNOSIS — Z792 Long term (current) use of antibiotics: Secondary | ICD-10-CM | POA: Insufficient documentation

## 2015-02-09 LAB — CBC WITH DIFFERENTIAL/PLATELET
BASOS PCT: 0 %
Basophils Absolute: 0 10*3/uL (ref 0.0–0.1)
Eosinophils Absolute: 0.1 10*3/uL (ref 0.0–0.7)
Eosinophils Relative: 1 %
HEMATOCRIT: 38.6 % — AB (ref 39.0–52.0)
HEMOGLOBIN: 12.3 g/dL — AB (ref 13.0–17.0)
LYMPHS ABS: 1.2 10*3/uL (ref 0.7–4.0)
Lymphocytes Relative: 16 %
MCH: 24.6 pg — AB (ref 26.0–34.0)
MCHC: 31.9 g/dL (ref 30.0–36.0)
MCV: 77 fL — AB (ref 78.0–100.0)
MONO ABS: 0.4 10*3/uL (ref 0.1–1.0)
MONOS PCT: 5 %
NEUTROS ABS: 5.7 10*3/uL (ref 1.7–7.7)
Neutrophils Relative %: 78 %
Platelets: 284 10*3/uL (ref 150–400)
RBC: 5.01 MIL/uL (ref 4.22–5.81)
RDW: 15.3 % (ref 11.5–15.5)
WBC: 7.3 10*3/uL (ref 4.0–10.5)

## 2015-02-09 LAB — COMPREHENSIVE METABOLIC PANEL
ALBUMIN: 3.7 g/dL (ref 3.5–5.0)
ALK PHOS: 63 U/L (ref 38–126)
ALT: 54 U/L (ref 17–63)
AST: 42 U/L — ABNORMAL HIGH (ref 15–41)
Anion gap: 9 (ref 5–15)
BUN: 14 mg/dL (ref 6–20)
CALCIUM: 8.8 mg/dL — AB (ref 8.9–10.3)
CO2: 23 mmol/L (ref 22–32)
Chloride: 107 mmol/L (ref 101–111)
Creatinine, Ser: 1.16 mg/dL (ref 0.61–1.24)
Glucose, Bld: 94 mg/dL (ref 65–99)
POTASSIUM: 3.2 mmol/L — AB (ref 3.5–5.1)
Sodium: 139 mmol/L (ref 135–145)
Total Bilirubin: 1.2 mg/dL (ref 0.3–1.2)
Total Protein: 6.8 g/dL (ref 6.5–8.1)

## 2015-02-09 LAB — BRAIN NATRIURETIC PEPTIDE: B Natriuretic Peptide: 505 pg/mL — ABNORMAL HIGH (ref 0.0–100.0)

## 2015-02-09 LAB — TROPONIN I: Troponin I: 0.08 ng/mL — ABNORMAL HIGH (ref <0.031)

## 2015-02-09 MED ORDER — LEVOFLOXACIN 750 MG PO TABS
750.0000 mg | ORAL_TABLET | Freq: Once | ORAL | Status: AC
  Administered 2015-02-09: 750 mg via ORAL
  Filled 2015-02-09: qty 1

## 2015-02-09 MED ORDER — FUROSEMIDE 40 MG PO TABS: 40.0000 mg | ORAL_TABLET | Freq: Every day | ORAL | Status: DC

## 2015-02-09 MED ORDER — CARVEDILOL 3.125 MG PO TABS
3.1250 mg | ORAL_TABLET | Freq: Two times a day (BID) | ORAL | Status: DC
  Administered 2015-02-09: 3.125 mg via ORAL
  Filled 2015-02-09 (×4): qty 1

## 2015-02-09 MED ORDER — ASPIRIN EC 81 MG PO TBEC
81.0000 mg | DELAYED_RELEASE_TABLET | Freq: Every day | ORAL | Status: DC
Start: 2015-02-09 — End: 2022-01-28

## 2015-02-09 MED ORDER — LEVOFLOXACIN 750 MG PO TABS: 750.0000 mg | ORAL_TABLET | Freq: Every day | ORAL | Status: DC

## 2015-02-09 MED ORDER — FUROSEMIDE 10 MG/ML IJ SOLN
40.0000 mg | INTRAMUSCULAR | Status: AC
  Administered 2015-02-09: 40 mg via INTRAVENOUS
  Filled 2015-02-09: qty 4

## 2015-02-09 MED ORDER — CARVEDILOL 3.125 MG PO TABS
ORAL_TABLET | ORAL | Status: AC
  Filled 2015-02-09: qty 1

## 2015-02-09 MED ORDER — CARVEDILOL 3.125 MG PO TABS: 3.1250 mg | ORAL_TABLET | Freq: Two times a day (BID) | ORAL | Status: DC

## 2015-02-09 MED ORDER — SACUBITRIL-VALSARTAN 24-26 MG PO TABS
1.0000 | ORAL_TABLET | Freq: Two times a day (BID) | ORAL | Status: DC
  Filled 2015-02-09 (×4): qty 1

## 2015-02-09 MED ORDER — SACUBITRIL-VALSARTAN 24-26 MG PO TABS: 1.0000 | ORAL_TABLET | Freq: Two times a day (BID) | ORAL | Status: DC

## 2015-02-09 NOTE — ED Provider Notes (Signed)
CSN: [id number]     Arrival date & time 02/09/15  1631 History   First MD Initiated Contact with Patient 02/09/15 1641     Chief Complaint  Patient presents with  . Shortness of Breath     (Consider location/radiation/quality/duration/timing/severity/associated sxs/prior Treatment) HPI Comments: The patient is a 39 year old male who has a history of congestive heart failure and cardiomyopathy, severe hypertension and pneumonia for which she was admitted to the hospital several months ago. He reports that approximately 7 years ago he was diagnosed with pneumonia and at that time was also told that he had a cardiomyopathy which was probably secondary to the infection however a short time after that infection his heart return to normal function according to the patient's report. Several months ago when he was admitted to the hospital he was found to have an ejection fraction of between 20 and 25% on echocardiogram. This has not been rechecked since discharge. He spent 5 days in the hospital receiving antibiotics and had antihypertensive medications prescribed. He was given one month of medications but states that he was unable to follow-up with primary care for a variety of different reasons. He has been without his medications since the end of July. He now reports progressive dyspnea which has been worse with exertion but also worse with laying supine. He is finding that his legs are swelling as well and is coughing. He denies any fevers. Symptoms are gradually worsening and are now moderate to severe.  Patient is a 39 y.o. male presenting with shortness of breath. The history is provided by the patient.  Shortness of Breath   Past Medical History  Diagnosis Date  . Cardiomyopathy (HCC)   . Hypertension   . Pneumonia   . Cardiomyopathy [facility])    History reviewed. No pertinent past surgical history. No family history on file. Social History  Substance Use Topics  . Smoking status: Never  Smoker   . Smokeless tobacco: Current User    Types: Chew  . Alcohol Use: Yes     Comment: occas    Review of Systems  Respiratory: Positive for shortness of breath.   All other systems reviewed and are negative.     Allergies  Codeine  Home Medications   Prior to Admission medications   Medication Sig Start Date End Date Taking? Authorizing Provider  aspirin EC 81 MG tablet Take 1 tablet (81 mg total) by mouth daily. 02/09/15   Eber Hong, MD  carvedilol (COREG) 3.125 MG tablet Take 1 tablet (3.125 mg total) by mouth 2 (two) times daily with a meal. 02/09/15   Eber Hong, MD  cefUROXime (CEFTIN) 500 MG tablet Take 1 tablet (500 mg total) by mouth 2 (two) times daily with a meal. 10/12/14   Standley Brooking, MD  furosemide (LASIX) 40 MG tablet Take 1 tablet (40 mg total) by mouth daily. 02/09/15   Eber Hong, MD  levofloxacin (LEVAQUIN) 750 MG tablet Take 1 tablet (750 mg total) by mouth daily. 02/09/15   Eber Hong, MD  potassium chloride SA (K-DUR,KLOR-CON) 20 MEQ tablet Take 2 tablets (40 mEq total) by mouth daily. 10/12/14   Standley Brooking, MD  sacubitril-valsartan (ENTRESTO) 24-26 MG Take 1 tablet by mouth 2 (two) times daily. 02/09/15   Eber Hong, MD   BP 151/115 mmHg  Pulse 97  Temp(Src) 97.8 F (36.6 C) (Oral)  Resp 27  Ht  (1.88 m)  Wt 336 lb (152.409 kg)  BMI 43.12 kg/m2  SpO2  97% Physical Exam  Constitutional:  Well appearing, speaks in shortened sentences because of mild tachypnea, no distress  HENT:  Oropharynx is clear and moist  Eyes:  Conjunctivae are clear, pupils are equal round and reactive to light  Neck:  Supple neck, difficult to obtain review of the neck secondary to excessive facial hair, no obvious JVD  Cardiovascular:  Some ectopy, no obvoius murmurs.  Pulmonary/Chest:  Subtle rales at bases, no wheezing, speaks in shortnened sentences, no accessory muscle use.  Abdominal:  Soft nontender abdomen, obese  Musculoskeletal:   Edema present in the bilateral lower extremities, symmetrical  Neurological:  Normal gait, speech, strength, cranial nerves III through XII are normal  Skin:  No rashes, warm dry skin    ED Course  Procedures (including critical care time) Labs Review Labs Reviewed  BRAIN NATRIURETIC PEPTIDE - Abnormal; Notable for the following:    B Natriuretic Peptide 505.0 (*)    All other components within normal limits  TROPONIN I - Abnormal; Notable for the following:    Troponin I 0.08 (*)    All other components within normal limits  CBC WITH DIFFERENTIAL/PLATELET - Abnormal; Notable for the following:    Hemoglobin 12.3 (*)    HCT 38.6 (*)    MCV 77.0 (*)    MCH 24.6 (*)    All other components within normal limits  COMPREHENSIVE METABOLIC PANEL - Abnormal; Notable for the following:    Potassium 3.2 (*)    Calcium 8.8 (*)    AST 42 (*)    All other components within normal limits    Imaging Review Dg Chest 2 View  02/09/2015  CLINICAL DATA:  Shortness of breath, history pneumonia EXAM: CHEST  2 VIEW COMPARISON:  CTA chest dated 10/07/2014 FINDINGS: Mild patchy/interstitial opacity in the left upper lobe, possibly reflecting infection versus asymmetric interstitial edema. No pleural effusion or pneumothorax. Cardiomegaly. IMPRESSION: Mild patchy/interstitial opacity in the left upper lobe, possibly reflecting infection versus asymmetric interstitial edema. Electronically Signed   By: Charline Bills M.D.   On: 02/09/2015 17:56   I have personally reviewed and evaluated these images and lab results as part of my medical decision-making.   EKG Interpretation   Date/Time:  Saturday February 09 2015 17:01:51 EDT Ventricular Rate:  103 PR Interval:  104 QRS Duration: 98 QT Interval:  371 QTC Calculation: 486 R Axis:   84 Text Interpretation:  Sinus or ectopic atrial tachycardia Multiform  ventricular premature complexes Probable LVH with secondary repol abnrm  Borderline  prolonged QT interval since last tracing no significant change  Confirmed by Hyacinth Meeker  MD, Naksh Radi ([id number]) on 02/09/2015 5:36:54 PM      MDM   Final diagnoses:  CAP (community acquired pneumonia)  Congestive heart failure, unspecified congestive heart failure chronicity, unspecified congestive heart failure type (HCC)    We'll obtain EKG, chest x-ray and labs, suspect fluid overload, he is severely hypertensive and will require some medications. Lasix, Coreg, valsartan and ordered.  The pt has improved with lasix - the tachypnea is no longer present - the tachycardia has resolved, there is no WBC elevation and BNP is only 500.  The pt BP has improved to normal levels with medicines and he has diuresed > with lasix - he feels better - he has not been febrile and has not been coughing.  Troponin is likely related to underlyilng cardiomyopathy.  He is having no CP.    I have seen his xray and find no  signs of significant edema, possible LUL infiltrate.    I have personally viewed and interpreted the imaging and agree with radiologist interpretation.   Pt informed of all results and appears stable for d/c.  Meds given in ED:  Medications  furosemide (LASIX) injection 40 mg (40 mg Intravenous Given 02/09/15 1722)  levofloxacin (LEVAQUIN) tablet 750 mg (750 mg Oral Given 02/09/15 1915)    Discharge Medication List as of 02/09/2015  6:34 PM    START taking these medications   Details  levofloxacin (LEVAQUIN) 750 MG tablet Take 1 tablet (750 mg total) by mouth daily., Starting 02/09/2015, Until Discontinued, Print          Eber Hong, MD 02/10/15 1201

## 2015-02-09 NOTE — Discharge Instructions (Signed)

## 2015-02-09 NOTE — ED Notes (Signed)
MD at bedside. 

## 2015-02-09 NOTE — ED Notes (Signed)
Short of breath, history of pneumonia, feels like he may have pneumonia again

## 2016-06-27 IMAGING — CT CT ANGIO CHEST
2 of 6 series · 6 of 36 positions shown · IV contrast (Omnipaque 300)
Comparison: Chest radiograph October 07, 2014

CLINICAL DATA: Shortness of breath and hemoptysis. History of
cardiomyopathy.

EXAM:
CT ANGIOGRAPHY CHEST WITH CONTRAST
TECHNIQUE: Multidetector CT imaging of the chest was performed using the
standard protocol during bolus administration of intravenous
contrast. Multiplanar CT image reconstructions and MIPs were
obtained to evaluate the vascular anatomy.
CONTRAST:  100mL OMNIPAQUE IOHEXOL 350 MG/ML SOLN

[Series 4: pe 3.0 b40f · axial · 0.90mm/px · z∈[-292,-76]mm · 5 of 110 slices shown]
[im 19/110  lung]
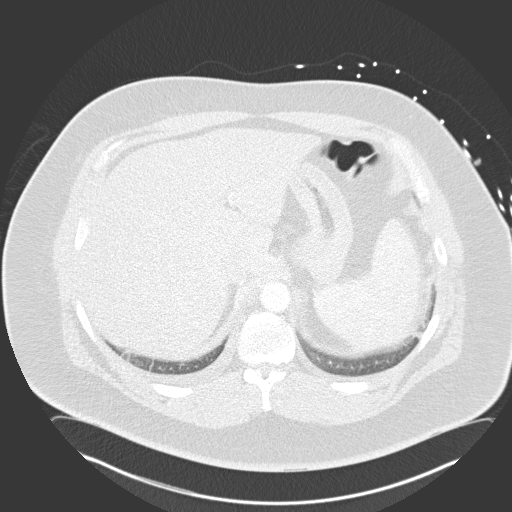
[im 37/110  mediastinal]
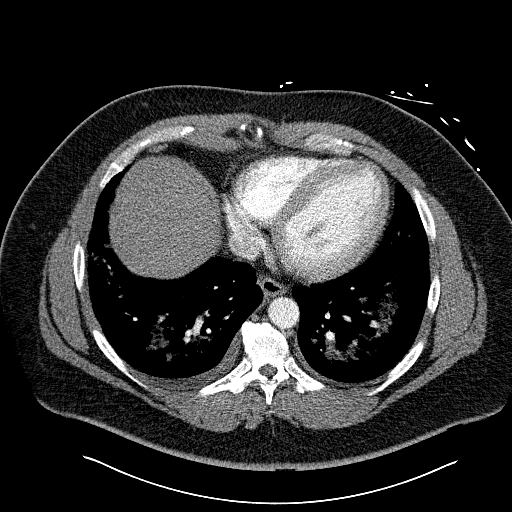
[im 55/110  lung]
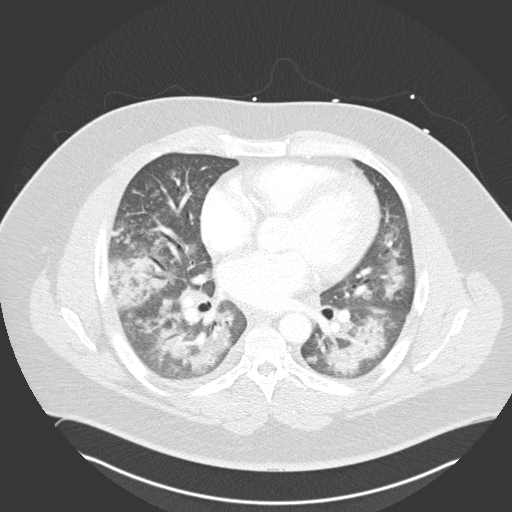
[im 73/110  mediastinal]
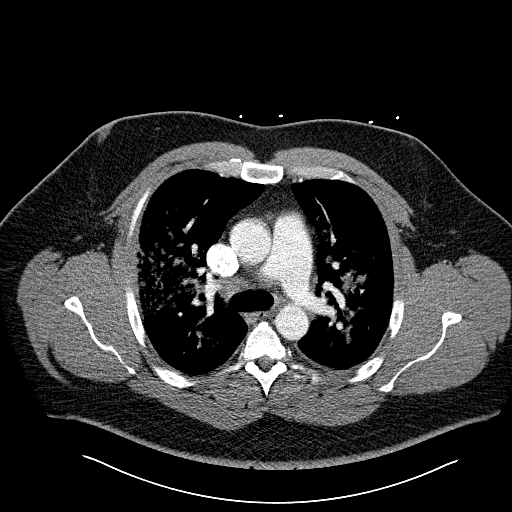
[im 91/110  lung]
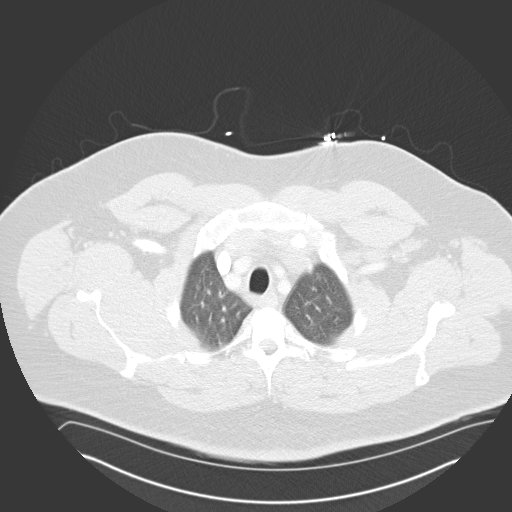

[Series 6: mpr coronal pe 3mm · coronal · 0.65mm/px · 1 of 108 slices shown]
[im 54/108  mediastinal]
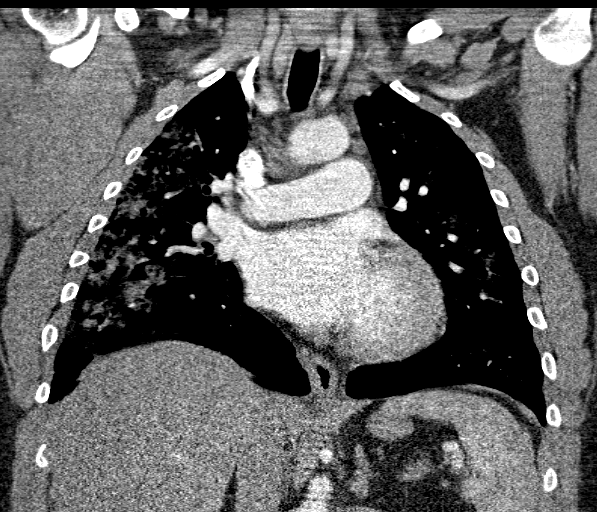

[6 of 36 positions shown; findings below may reference images not displayed]

FINDINGS: There is no demonstrable pulmonary embolus. There is no thoracic
aortic aneurysm or dissection.

There is extensive airspace consolidation throughout both lungs with
essentially equivalent upper and lower lobe prominence. There is no
appreciable interstitial edema. There is slight lower lobe
bronchiectasis bilaterally.

Thyroid appears normal. There are small mediastinal lymph nodes but
no adenopathy by size criteria. The pericardium is not thickened.

In the visualized upper abdomen, there is hepatic steatosis. Both
kidneys show fetal lobulations, an anatomic variant.

There are no blastic or lytic bone lesions.

Review of the MIP images confirms the above findings.
IMPRESSION: No demonstrable pulmonary embolus.

Widespread airspace opacity bilaterally. Major differential
considerations include extensive infectious pneumonia, pulmonary
hemorrhage, or extensive noncardiogenic edema such as due to toxin
exposure or medication reaction. The appearance is atypical for
potential congestive heart failure.

Multiple small mediastinal lymph nodes but no frank adenopathy by
size criteria.

There is hepatic steatosis.

## 2016-06-27 IMAGING — DX DG CHEST 2V
2 series · 2 of 2 positions shown · non-contrast
Comparison: None.

CLINICAL DATA: Cough and shortness of breath

EXAM:
CHEST  2 VIEW

[chest pa]
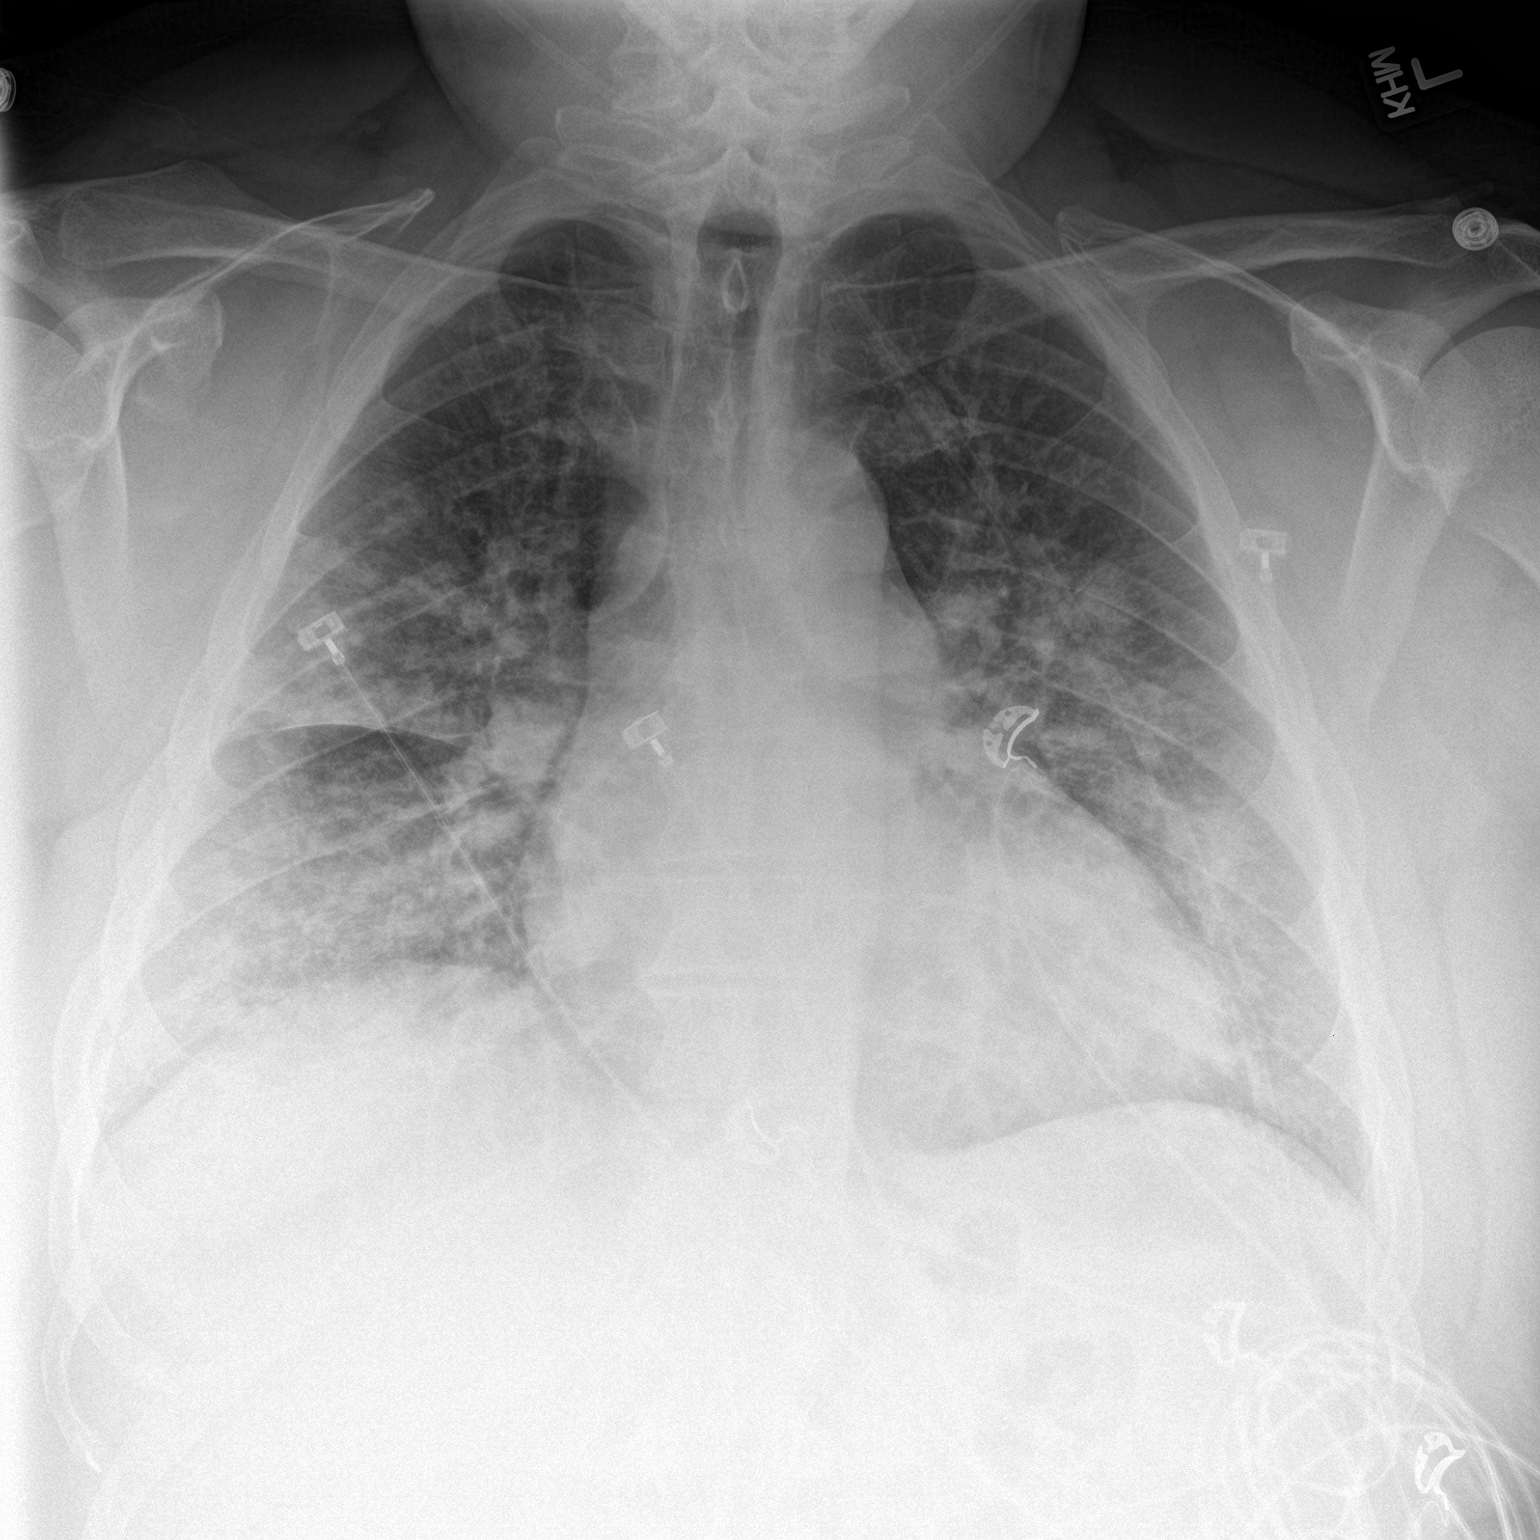

[chest lat]
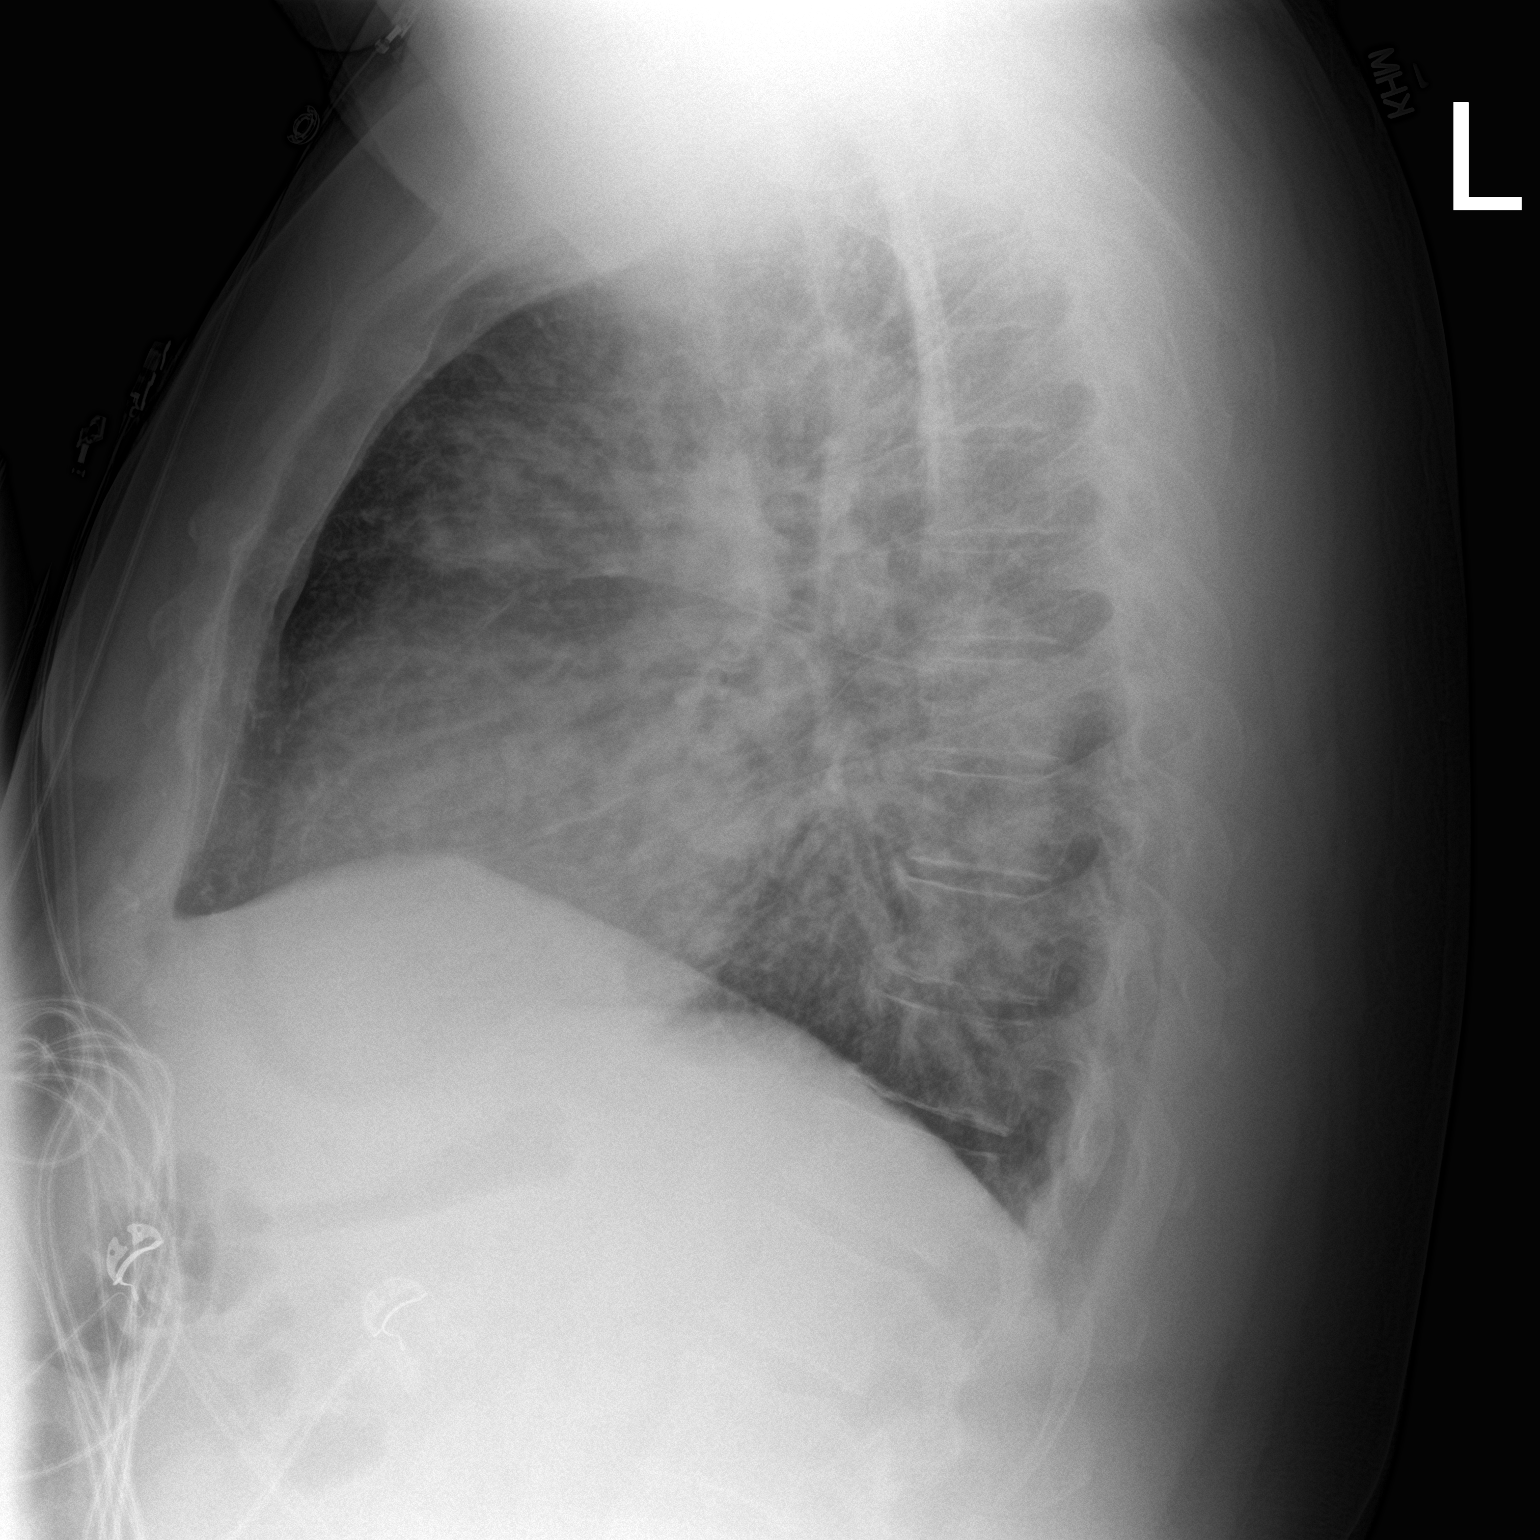

[2 of 2 positions shown; findings below may reference images not displayed]

FINDINGS: Mild enlargement of the cardiomediastinal silhouette is noted. Multi
lobar patchy airspace opacities are noted with central vascular
congestion. No pleural effusion. No acute osseous finding.
IMPRESSION: Cardiomegaly with nonspecific patchy multi lobar airspace opacities.
This could represent asymmetric edema but other alveolar filling
processes including hemorrhage, infection, pus, or protein could
appear similar.

## 2017-04-28 ENCOUNTER — Emergency Department (HOSPITAL_COMMUNITY): Payer: BLUE CROSS/BLUE SHIELD

## 2017-04-28 ENCOUNTER — Emergency Department (HOSPITAL_COMMUNITY)
Admission: EM | Admit: 2017-04-28 | Discharge: 2017-04-28 | Disposition: A | Discharge status: Home / Self Care | Payer: BLUE CROSS/BLUE SHIELD | Attending: Emergency Medicine | Admitting: Emergency Medicine

## 2017-04-28 ENCOUNTER — Encounter (HOSPITAL_COMMUNITY): Payer: Self-pay

## 2017-04-28 DIAGNOSIS — R05 Cough: Secondary | ICD-10-CM | POA: Diagnosis present

## 2017-04-28 DIAGNOSIS — I110 Hypertensive heart disease with heart failure: Secondary | ICD-10-CM | POA: Insufficient documentation

## 2017-04-28 DIAGNOSIS — I5021 Acute systolic (congestive) heart failure: Secondary | ICD-10-CM | POA: Insufficient documentation

## 2017-04-28 DIAGNOSIS — I509 Heart failure, unspecified: Secondary | ICD-10-CM

## 2017-04-28 DIAGNOSIS — Z79899 Other long term (current) drug therapy: Secondary | ICD-10-CM | POA: Insufficient documentation

## 2017-04-28 DIAGNOSIS — I429 Cardiomyopathy, unspecified: Secondary | ICD-10-CM | POA: Insufficient documentation

## 2017-04-28 DIAGNOSIS — Z7982 Long term (current) use of aspirin: Secondary | ICD-10-CM | POA: Diagnosis not present

## 2017-04-28 HISTORY — DX: Heart failure, unspecified: I50.9

## 2017-04-28 LAB — CBC WITH DIFFERENTIAL/PLATELET
Basophils Absolute: 0 10*3/uL (ref 0.0–0.1)
Basophils Relative: 0 %
Eosinophils Absolute: 0.1 10*3/uL (ref 0.0–0.7)
Eosinophils Relative: 1 %
HEMATOCRIT: 43 % (ref 39.0–52.0)
HEMOGLOBIN: 13.4 g/dL (ref 13.0–17.0)
Lymphocytes Relative: 9 %
Lymphs Abs: 1.1 10*3/uL (ref 0.7–4.0)
MCH: 24.1 pg — AB (ref 26.0–34.0)
MCHC: 31.2 g/dL (ref 30.0–36.0)
MCV: 77.2 fL — AB (ref 78.0–100.0)
MONOS PCT: 4 %
Monocytes Absolute: 0.5 10*3/uL (ref 0.1–1.0)
NEUTROS ABS: 10.6 10*3/uL — AB (ref 1.7–7.7)
NEUTROS PCT: 86 %
Platelets: 288 10*3/uL (ref 150–400)
RBC: 5.57 MIL/uL (ref 4.22–5.81)
RDW: 15.9 % — ABNORMAL HIGH (ref 11.5–15.5)
WBC: 12.4 10*3/uL — ABNORMAL HIGH (ref 4.0–10.5)

## 2017-04-28 LAB — COMPREHENSIVE METABOLIC PANEL
ALBUMIN: 4 g/dL (ref 3.5–5.0)
ALT: 26 U/L (ref 17–63)
AST: 21 U/L (ref 15–41)
Alkaline Phosphatase: 74 U/L (ref 38–126)
Anion gap: 8 (ref 5–15)
BUN: 10 mg/dL (ref 6–20)
CHLORIDE: 105 mmol/L (ref 101–111)
CO2: 27 mmol/L (ref 22–32)
Calcium: 9 mg/dL (ref 8.9–10.3)
Creatinine, Ser: 1.02 mg/dL (ref 0.61–1.24)
GFR calc non Af Amer: >60 mL/min (ref >60)
Glucose, Bld: 86 mg/dL (ref 65–99)
Potassium: 3.4 mmol/L — ABNORMAL LOW (ref 3.5–5.1)
SODIUM: 140 mmol/L (ref 135–145)
Total Bilirubin: 1.1 mg/dL (ref 0.3–1.2)
Total Protein: 7.3 g/dL (ref 6.5–8.1)

## 2017-04-28 LAB — BRAIN NATRIURETIC PEPTIDE: B Natriuretic Peptide: 209 pg/mL — ABNORMAL HIGH (ref 0.0–100.0)

## 2017-04-28 MED ORDER — FUROSEMIDE 10 MG/ML IJ SOLN
40.0000 mg | Freq: Once | INTRAMUSCULAR | Status: AC
  Administered 2017-04-28: 40 mg via INTRAVENOUS
  Filled 2017-04-28: qty 4

## 2017-04-28 MED ORDER — FUROSEMIDE 40 MG PO TABS: 40.0000 mg | ORAL_TABLET | Freq: Every day | ORAL | 0 refills | Status: DC

## 2017-04-28 MED ORDER — CARVEDILOL 6.25 MG PO TABS: 6.2500 mg | ORAL_TABLET | Freq: Two times a day (BID) | ORAL | 2 refills | Status: DC

## 2017-04-28 MED ORDER — SODIUM CHLORIDE 0.9 % IV SOLN: INTRAVENOUS | Status: DC

## 2017-04-28 MED ORDER — ALBUTEROL SULFATE (2.5 MG/3ML) 0.083% IN NEBU: 5.0000 mg | INHALATION_SOLUTION | Freq: Once | RESPIRATORY_TRACT | Status: DC

## 2017-04-28 MED ORDER — NITROGLYCERIN IN D5W 200-5 MCG/ML-% IV SOLN
0.0000 ug/min | INTRAVENOUS | Status: DC
  Administered 2017-04-28: 5 ug/min via INTRAVENOUS
  Filled 2017-04-28: qty 250

## 2017-04-28 NOTE — ED Provider Notes (Signed)
[facility] EMERGENCY DEPARTMENT Provider Note   CSN: [id number] Arrival date & time: 04/28/17  1221     History   Chief Complaint Chief Complaint  Patient presents with  . Cough  . Shortness of Breath    HPI Clare Casto is a 42 y.o. male.  Patient with a known history of cardiomyopathy hypertension recurrent pneumonia and congestive heart failure.  Patient's been off his medication is not had good follow-up at all with cardiology for probably over 2 years.  Patient presenting here today with shortness of breath and cough it has been bad for about a week.  Particularly the past 2 days.  Patient does not have a primary care provider.      Past Medical History:  Diagnosis Date  . Cardiomyopathy (HCC)   . Cardiomyopathy (HCC)   . CHF (congestive heart failure) (HCC)   . Hypertension   . Pneumonia     Patient Active Problem List   Diagnosis Date Noted  . Acute respiratory failure with hypoxia (HCC) 10/11/2014  . Anemia 10/10/2014  . Hypokalemia   . Acute systolic heart failure (HCC) 10/09/2014  . Hypertension   . CAP (community acquired pneumonia) 10/07/2014  . Obesity (BMI 35.0-39.9 without comorbidity) 10/07/2014    History reviewed. No pertinent surgical history.     Home Medications    Prior to Admission medications   Medication Sig Start Date End Date Taking? Authorizing Provider  aspirin EC 81 MG tablet Take 1 tablet (81 mg total) by mouth daily. 02/09/15   Eber Hong, MD  carvedilol (COREG) 3.125 MG tablet Take 1 tablet (3.125 mg total) by mouth 2 (two) times daily with a meal. 02/09/15   Eber Hong, MD  cefUROXime (CEFTIN) 500 MG tablet Take 1 tablet (500 mg total) by mouth 2 (two) times daily with a meal. 10/12/14   Standley Brooking, MD  furosemide (LASIX) 40 MG tablet Take 1 tablet (40 mg total) by mouth daily. 02/09/15   Eber Hong, MD  levofloxacin (LEVAQUIN) 750 MG tablet Take 1 tablet (750 mg total) by mouth daily. 02/09/15   Eber Hong, MD  potassium chloride SA (K-DUR,KLOR-CON) 20 MEQ tablet Take 2 tablets (40 mEq total) by mouth daily. 10/12/14   Standley Brooking, MD  sacubitril-valsartan (ENTRESTO) 24-26 MG Take 1 tablet by mouth 2 (two) times daily. 02/09/15   Eber Hong, MD    Family History No family history on file.  Social History Social History   Tobacco Use  . Smoking status: Never Smoker  . Smokeless tobacco: Current User    Types: Chew  Substance Use Topics  . Alcohol use: Yes    Frequency: Never    Comment: occas  . Drug use: No     Allergies   Codeine   Review of Systems Review of Systems  Constitutional: Negative for fever.  HENT: Negative for congestion.   Eyes: Negative for redness.  Respiratory: Positive for cough and shortness of breath.   Cardiovascular: Positive for leg swelling. Negative for chest pain.  Gastrointestinal: Negative for abdominal pain.  Genitourinary: Negative for dysuria.  Musculoskeletal: Negative for back pain.  Skin: Negative for rash.  Neurological: Negative for syncope and headaches.  Hematological: Does not bruise/bleed easily.  Psychiatric/Behavioral: Negative for confusion.     Physical Exam Updated Vital Signs BP (!) 180/98   Pulse (!) 45   Temp 98.1 F (36.7 C) (Oral)   Resp (!) 29   Ht 1.854 m (6\' 1" )  Wt (!) 154.2 kg (340 lb)   SpO2 91%   BMI 44.86 kg/m   Physical Exam  Constitutional: He is oriented to person, place, and time. He appears well-developed and well-nourished. He does not appear ill. No distress.  HENT:  Head: Normocephalic and atraumatic.  Mouth/Throat: Oropharynx is clear and moist.  Eyes: Conjunctivae and EOM are normal. Pupils are equal, round, and reactive to light.  Neck: Normal range of motion. Neck supple.  Cardiovascular: Normal rate, regular rhythm and normal heart sounds.  Pulmonary/Chest: Effort normal. No respiratory distress. He has rales.  Abdominal: Soft. Bowel sounds are normal. There is no  tenderness.  Musculoskeletal: He exhibits edema.  Neurological: He is alert and oriented to person, place, and time. No cranial nerve deficit or sensory deficit. He exhibits normal muscle tone. Coordination normal.  Skin: Skin is warm.  Nursing note and vitals reviewed.    ED Treatments / Results  Labs (all labs ordered are listed, but only abnormal results are displayed) Labs Reviewed  COMPREHENSIVE METABOLIC PANEL - Abnormal; Notable for the following components:      Result Value   Potassium 3.4 (*)    All other components within normal limits  CBC WITH DIFFERENTIAL/PLATELET - Abnormal; Notable for the following components:   WBC 12.4 (*)    MCV 77.2 (*)    MCH 24.1 (*)    RDW 15.9 (*)    Neutro Abs 10.6 (*)    All other components within normal limits  BRAIN NATRIURETIC PEPTIDE    EKG  EKG Interpretation None       Radiology Dg Chest 2 View  Result Date: 04/28/2017 CLINICAL DATA:  Cough and shortness of breath for the past week. History of CHF, hypertension, previous episodes of pneumonia. EXAM: CHEST  2 VIEW COMPARISON:  Chest x-ray of February 09, 2015 FINDINGS: The lungs are adequately inflated. The interstitial markings are increased. There are fluffy airspace opacities bilaterally. The cardiac silhouette is enlarged and the pulmonary vascularity engorged. There is a small amount of pleural fluid blunting the posterior costophrenic angles. The bony thorax exhibits no acute abnormality. IMPRESSION: Findings compatible with CHF with interstitial and alveolar edema. One cannot absolutely exclude superimposed pneumonia. Electronically Signed   By: David  [place] M.D.   On: 04/28/2017 13:18    Procedures Procedures (including critical care time)  Medications Ordered in ED Medications  0.9 %  sodium chloride infusion (not administered)  nitroGLYCERIN 50 mg in dextrose 5 % 250 mL (0.2 mg/mL) infusion (35 mcg/min Intravenous Rate/Dose Change 04/28/17 1503)  furosemide (LASIX)  injection 40 mg (40 mg Intravenous Given 04/28/17 1422)     Initial Impression / Assessment and Plan / ED Course  I have reviewed the triage vital signs and the nursing notes.  Pertinent labs & imaging results that were available during my care of the patient were reviewed by me and considered in my medical decision making (see chart for details).     Patient's chest x-ray consistent with CHF.  Patient also with markedly elevated blood pressures.  Had 170/120 to start.  Consistent with a hypertensive urgency chest x-ray also stated showed CHF.  Patient had not been on any of his medications.  Patient is supposed to be on Coreg.  He is been off of it for years.  Based on the hypertension and the congestive heart failure patient meets criteria for admission.  The patient is refusing admission.  Patient was in agreement to do CT chest to rule out  that there may be pneumonia as well.  That would lend to him being treated with antibiotics.  Patient will eventually sign out AMA he understands it is very dangerous to the fluid in his lungs get much worse and that he could die.  Patient says he has home commitments he cannot be admitted.  In the meantime decided to treat him IV nitroglycerin and titrate to help bring the pressure down in case he changes his mind about admission and also gave him Lasix 40 mg.  We will prep him for at least some medications to take at home to include Lasix and the Coreg and referral back to cardiology if he insists on leaving.  Otherwise he needs admission.  Patient will be turned over to the evening physician who will check his CT.  Otherwise paperwork is completed for him to leave the notes and AMA discharge.   Final Clinical Impressions(s) / ED Diagnoses   Final diagnoses:  Congestive heart failure, unspecified HF chronicity, unspecified heart failure type [facility])    ED Discharge Orders    None       Vanetta Mulders, MD 04/28/17 1521

## 2017-04-28 NOTE — Discharge Instructions (Signed)
Return at any time based on today's workup you need admission.  Have renewed your Coreg prescription.  And also have given you a prescription for Lasix.  Call cardiology for follow-up.

## 2017-04-28 NOTE — ED Triage Notes (Signed)
Pt reports that he has been coughing for approx a week. Pt reports that he has been SOB with exertion for 2 days

## 2017-04-28 NOTE — ED Notes (Signed)
ED Provider at bedside. 

## 2017-05-17 ENCOUNTER — Ambulatory Visit (INDEPENDENT_AMBULATORY_CARE_PROVIDER_SITE_OTHER): Payer: BLUE CROSS/BLUE SHIELD | Admitting: Cardiovascular Disease

## 2017-05-17 ENCOUNTER — Encounter: Payer: Self-pay | Admitting: Cardiovascular Disease

## 2017-05-17 ENCOUNTER — Other Ambulatory Visit: Payer: Self-pay

## 2017-05-17 VITALS — BP 134/89 | HR 88 | Ht 73.0 in | Wt 338.0 lb

## 2017-05-17 DIAGNOSIS — I10 Essential (primary) hypertension: Secondary | ICD-10-CM | POA: Diagnosis not present

## 2017-05-17 DIAGNOSIS — Z9289 Personal history of other medical treatment: Secondary | ICD-10-CM | POA: Diagnosis not present

## 2017-05-17 DIAGNOSIS — I712 Thoracic aortic aneurysm, without rupture, unspecified: Secondary | ICD-10-CM

## 2017-05-17 DIAGNOSIS — I519 Heart disease, unspecified: Secondary | ICD-10-CM

## 2017-05-17 DIAGNOSIS — I5023 Acute on chronic systolic (congestive) heart failure: Secondary | ICD-10-CM

## 2017-05-17 MED ORDER — CARVEDILOL 6.25 MG PO TABS: 6.2500 mg | ORAL_TABLET | Freq: Two times a day (BID) | ORAL | 3 refills | Status: DC

## 2017-05-17 MED ORDER — FUROSEMIDE 40 MG PO TABS: 40.0000 mg | ORAL_TABLET | Freq: Every day | ORAL | 3 refills | Status: DC

## 2017-05-17 MED ORDER — POTASSIUM CHLORIDE CRYS ER 20 MEQ PO TBCR: 20.0000 meq | EXTENDED_RELEASE_TABLET | Freq: Every day | ORAL | 3 refills | Status: DC

## 2017-05-17 NOTE — Patient Instructions (Signed)
Medication Instructions:   Begin Potassium daily.  Continue all other medications.    Labwork: none  Testing/Procedures:  Your physician has requested that you have an echocardiogram. Echocardiography is a painless test that uses sound waves to create images of your heart. It provides your doctor with information about the size and shape of your heart and how well your heart's chambers and valves are working. This procedure takes approximately one hour. There are no restrictions for this procedure.  Office will contact with results via phone or letter.    Follow-Up: 3 weeks   Any Other Special Instructions Will Be Listed Below (If Applicable).  If you need a refill on your cardiac medications before your next appointment, please call your pharmacy.

## 2017-05-17 NOTE — Progress Notes (Signed)
SUBJECTIVE: The patient presents for past due follow-up.  I last evaluated him while he was hospitalized in June 2016. He has chronic systolic heart failure with severe left ventricular dysfunction.  Echocardiogram 10/08/14: Severely reduced left ventricular systolic function, LVEF 20-25%, mild to moderate mitral regurgitation, mild left atrial dilatation.  I started him on Entresto at that time.  He was taking carvedilol and Entresto up until about a year ago and then stopped them both.  He presented to the ED on 04/28/17 and acute on chronic systolic heart failure.  Blood pressure was 180/98.  He was placed on an IV nitroglycerin drip.    I reviewed all relevant documentation, labs, and studies.  Chest x-ray showed CHF.  Chest CT showed mild ascending aortic aneurysmal dilatation, 4 cm.  Also demonstrated bilateral pleural effusions.  Relevant labs: Potassium was low at 3.4, sodium 140, BUN 10, creatinine 1.02, elevated white blood cell count of 12.4, Hemoccult and 13.4, platelets 288, BNP 209.  He said he has begun experiencing progressive exertional dyspnea since the day after Christmas.  His wife who is 37 years old underwent three-vessel CABG at that time.  He has a 42 year old and a 73-1/2-year-old child.  He has been under a lot of stress.  He denies chest pain.  He has 3-4 pillow orthopnea.  I personally reviewed the ECG performed on 04/28/17 which demonstrated sinus rhythm with PACs and PVCs and probable LVH with repolarization abnormalities.  There is a nonspecific intraventricular conduction delay and T wave inversions inferiorly and V4 through V6.  Hospitalization was recommended but he signed out AMA.  He was given Lasix and carvedilol.  He took 2 Lasix last week for increasing shortness of breath and has felt okay since that time.  Review of Systems: As per "subjective", otherwise negative.  Allergies  Allergen Reactions  . Codeine Other (See Comments)    Severe  headache Pt reports being able to take percocet & vicodin    Current Outpatient Medications  Medication Sig Dispense Refill  . aspirin EC 81 MG tablet Take 1 tablet (81 mg total) by mouth daily. 30 tablet 0  . carvedilol (COREG) 6.25 MG tablet Take 1 tablet (6.25 mg total) by mouth 2 (two) times daily with a meal. 30 tablet 2  . furosemide (LASIX) 40 MG tablet Take 1 tablet (40 mg total) by mouth daily. 30 tablet 0   No current facility-administered medications for this visit.     Past Medical History:  Diagnosis Date  . Cardiomyopathy (HCC)   . Cardiomyopathy (HCC)   . CHF (congestive heart failure) (HCC)   . Hypertension   . Pneumonia     Past Surgical History:  Procedure Laterality Date  . NO PAST SURGERIES      Social History   Socioeconomic History  . Marital status: Married    Spouse name: Not on file  . Number of children: Not on file  . Years of education: Not on file  . Highest education level: Not on file  Social Needs  . Financial resource strain: Not on file  . Food insecurity - worry: Not on file  . Food insecurity - inability: Not on file  . Transportation needs - medical: Not on file  . Transportation needs - non-medical: Not on file  Occupational History  . Not on file  Tobacco Use  . Smoking status: Never Smoker  . Smokeless tobacco: Current User    Types: Snuff  Substance and  Sexual Activity  . Alcohol use: Yes    Frequency: Never    Comment: occas  . Drug use: No  . Sexual activity: Not on file  Other Topics Concern  . Not on file  Social History Narrative  . Not on file     Vitals:   05/17/17 0835  BP: 134/89  Pulse: 88  SpO2: 97%  Weight: (!) 338 lb (153.3 kg)  Height: 6\' 1"  (1.854 m)    Wt Readings from Last 3 Encounters:  05/17/17 (!) 338 lb (153.3 kg)  04/28/17 (!) 340 lb (154.2 kg)  02/09/15 (!) 336 lb (152.4 kg)     PHYSICAL EXAM General: NAD, morbidly obese HEENT: Poor dentition. Neck: JVP was difficult to  assess due to body habitus. Lungs: No crackles or wheezes. CV: Regular rate and rhythm, normal S1/S2, no S3/S4, no murmur. No pretibial or periankle edema.  No carotid bruit.   Abdomen: Firm, protuberant.  Neurologic: Alert and oriented.  Psych: Normal affect. Skin: Normal. Musculoskeletal: No gross deformities.    ECG: Most recent ECG reviewed.   Labs: Lab Results  Component Value Date/Time   K 3.4 (L) 04/28/2017 01:58 PM   BUN 10 04/28/2017 01:58 PM   CREATININE 1.02 04/28/2017 01:58 PM   ALT 26 04/28/2017 01:58 PM   HGB 13.4 04/28/2017 01:58 PM     Lipids: No results found for: LDLCALC, LDLDIRECT, CHOL, TRIG, HDL     ASSESSMENT AND PLAN: 1.  Acute on chronic systolic heart failure/cardiomyopathy: He is not clearly decompensated today.  I talked to him at length about the importance of adherence to medical therapy and routine follow-up for medical care.  I will obtain an echocardiogram to evaluate for interval changes in left ventricular systolic function.  He is currently on aspirin, carvedilol, and Lasix.  Before initiating angiotensin receptor blocker/ACE inhibitor, I will evaluate cardiac function.  I will also prescribe potassium chloride 20 meq once daily. He will require an ischemic workup.  He has no known prior history of myocardial infarction.  2.  Chronic hypertension: Blood pressure is very mildly elevated.  No changes to therapy at this time.  I will obtain an echocardiogram as noted above.  If LVEF remains severely reduced, I will likely had an ACE inhibitor or angiotensin receptor blocker.  3.  Mild aneurysmal aortic dilatation: I will monitor.   Disposition: Follow up 3 weeks   Time spent: 40 minutes, of which greater than 50% was spent reviewing symptoms, relevant blood tests and studies, and discussing management plan with the patient.    Prentice Docker, M.D., F.A.C.C.

## 2017-05-20 ENCOUNTER — Other Ambulatory Visit: Payer: Self-pay

## 2017-05-20 ENCOUNTER — Ambulatory Visit (INDEPENDENT_AMBULATORY_CARE_PROVIDER_SITE_OTHER): Payer: BLUE CROSS/BLUE SHIELD

## 2017-05-20 DIAGNOSIS — I5023 Acute on chronic systolic (congestive) heart failure: Secondary | ICD-10-CM | POA: Diagnosis not present

## 2017-05-24 ENCOUNTER — Encounter: Payer: Self-pay | Admitting: *Deleted

## 2017-05-24 ENCOUNTER — Telehealth: Payer: Self-pay | Admitting: *Deleted

## 2017-05-24 DIAGNOSIS — I5021 Acute systolic (congestive) heart failure: Secondary | ICD-10-CM

## 2017-05-24 DIAGNOSIS — R931 Abnormal findings on diagnostic imaging of heart and coronary circulation: Secondary | ICD-10-CM

## 2017-05-24 MED ORDER — SACUBITRIL-VALSARTAN 24-26 MG PO TABS: 1.0000 | ORAL_TABLET | Freq: Two times a day (BID) | ORAL | 6 refills | Status: DC

## 2017-05-24 MED ORDER — SACUBITRIL-VALSARTAN 24-26 MG PO TABS: 1.0000 | ORAL_TABLET | Freq: Two times a day (BID) | ORAL | 0 refills | Status: DC

## 2017-05-24 NOTE — Progress Notes (Signed)
Medication Instructions:   Begin Entresto 24/26mg  twice a day.    1 month free trial card offer given & prescription sent to pharmacy for this.    Regular prescription sent to pharmacy as well for them to run through insurance purposes.     Labwork:  BMET - order given.  Please do approximately 5 days after beginning the [facility].  Can do at lab [facility]) across from [facility].  Office will contact with results via phone or letter.    Testing/Procedures:  Your physician has requested that you have a lexiscan myoview. For further information please visit [email or web address] Please follow instruction sheet, as given.  Office will contact with results via phone or letter.    Follow-Up: Keep already scheduled follow up for 06/10/2017.    Any Other Special Instructions Will Be Listed Below (If Applicable). You have been referred to:  Electrophysiology (EP) - Dr. Hillis Range - evaluation for need of device.    If you need a refill on your cardiac medications before your next appointment, please call your pharmacy.

## 2017-05-24 NOTE — Telephone Encounter (Signed)
Notes recorded by Lesle Chris, LPN on 12/23/766 at 3:09 PM EST Patient notified. Copy to pmd. Follow up scheduled for 06/10/2017 with Dr. Purvis Sheffield. He agrees to doing the [place] & referral to EP. New medication sent to [facility]. 1 month free trial card given. ------  Notes recorded by Laqueta Linden, MD on 05/20/2017 at 5:11 PM EST LV function remains severely reduced. Start Entresto 24/26 mg bid. Obtain BMET 5 days afterwards. Needs a Lexiscan Myoview to evaluate for ischemia and an EP referral for ICD.

## 2017-05-24 NOTE — Progress Notes (Signed)
Medication Instructions:   Begin Entresto 24/26mg twice a day.    1 month free trial card offer given & prescription sent to pharmacy for this.    Regular prescription sent to pharmacy as well for them to run through insurance purposes.     Labwork:  BMET - order given.  Please do approximately 5 days after beginning the Entresto.  Can do at lab ([facility]) across from [facility].  Office will contact with results via phone or letter.    Testing/Procedures:  Your physician has requested that you have a lexiscan myoview. For further information please visit [email or web address]. Please follow instruction sheet, as given.  Office will contact with results via phone or letter.    Follow-Up: Keep already scheduled follow up for 06/10/2017.    Any Other Special Instructions Will Be Listed Below (If Applicable). You have been referred to:  Electrophysiology (EP) - Dr. James Allred - evaluation for need of device.    If you need a refill on your cardiac medications before your next appointment, please call your pharmacy.  

## 2017-05-25 ENCOUNTER — Telehealth: Payer: Self-pay | Admitting: Cardiovascular Disease

## 2017-05-25 NOTE — Telephone Encounter (Signed)
Lexiscan Scheduled at [facility] [id number] arrive 9:15  Acute systolic heart failure (HCC) Decreased cardiac ejection fraction

## 2017-05-26 ENCOUNTER — Other Ambulatory Visit: Payer: Self-pay

## 2017-05-26 MED ORDER — SACUBITRIL-VALSARTAN 24-26 MG PO TABS: 1.0000 | ORAL_TABLET | Freq: Two times a day (BID) | ORAL | 0 refills | Status: DC

## 2017-05-27 ENCOUNTER — Telehealth: Payer: Self-pay | Admitting: Cardiovascular Disease

## 2017-05-27 NOTE — Telephone Encounter (Signed)
Mrs. Cole Singleton called stating that they cannot get his medication Entresto filled using the Pharmacy cards. Please call [phone].

## 2017-05-27 NOTE — Telephone Encounter (Signed)
Discussed with wife Efraim Kaufmann) - can only use the free 30 day trial card - per pharm - used one June 2015.  Can only use one per life time.  Was able to locate the $10.00 co-pay card for him to try.  Prior authorization has been done through cover my meds & we are awaiting reply.  Will notify wife of outcome.

## 2017-06-01 ENCOUNTER — Ambulatory Visit (HOSPITAL_COMMUNITY)
Admission: RE | Admit: 2017-06-01 | Discharge: 2017-06-01 | Disposition: A | Discharge status: Home / Self Care | Payer: BLUE CROSS/BLUE SHIELD | Source: Ambulatory Visit | Attending: Cardiovascular Disease | Admitting: Cardiovascular Disease

## 2017-06-01 ENCOUNTER — Encounter (HOSPITAL_COMMUNITY): Payer: Self-pay

## 2017-06-01 ENCOUNTER — Encounter (HOSPITAL_COMMUNITY)
Admission: RE | Admit: 2017-06-01 | Discharge: 2017-06-01 | Disposition: A | Discharge status: Home / Self Care | Payer: BLUE CROSS/BLUE SHIELD | Source: Ambulatory Visit | Attending: Cardiovascular Disease | Admitting: Cardiovascular Disease

## 2017-06-01 DIAGNOSIS — R931 Abnormal findings on diagnostic imaging of heart and coronary circulation: Secondary | ICD-10-CM

## 2017-06-01 DIAGNOSIS — I5021 Acute systolic (congestive) heart failure: Secondary | ICD-10-CM | POA: Diagnosis not present

## 2017-06-01 LAB — NM MYOCAR MULTI W/SPECT W/WALL MOTION / EF
CHL CUP NUCLEAR SSS: 1
CHL CUP RESTING HR STRESS: 85 {beats}/min
LHR: 0.31
LVDIAVOL: 333 mL (ref 62–150)
LVSYSVOL: 241 mL
NUC STRESS TID: 1.04
Peak HR: 96 {beats}/min
SDS: 1
SRS: 0

## 2017-06-01 MED ORDER — TECHNETIUM TC 99M TETROFOSMIN IV KIT
10.0000 | PACK | Freq: Once | INTRAVENOUS | Status: AC | PRN
  Administered 2017-06-01: 10 via INTRAVENOUS

## 2017-06-01 MED ORDER — TECHNETIUM TC 99M TETROFOSMIN IV KIT
30.0000 | PACK | Freq: Once | INTRAVENOUS | Status: AC | PRN
  Administered 2017-06-01: 30 via INTRAVENOUS

## 2017-06-01 MED ORDER — SODIUM CHLORIDE 0.9% FLUSH
INTRAVENOUS | Status: AC
  Administered 2017-06-01: 10 mL via INTRAVENOUS
  Filled 2017-06-01: qty 10

## 2017-06-01 MED ORDER — REGADENOSON 0.4 MG/5ML IV SOLN
INTRAVENOUS | Status: AC
  Administered 2017-06-01: 0.4 mg via INTRAVENOUS
  Filled 2017-06-01: qty 5

## 2017-06-03 MED ORDER — SACUBITRIL-VALSARTAN 24-26 MG PO TABS: 1.0000 | ORAL_TABLET | Freq: Two times a day (BID) | ORAL | 0 refills | Status: DC

## 2017-06-03 NOTE — Telephone Encounter (Signed)
Cole Singleton is calling asking the status of authorization.   Stated her husband needs to get medication

## 2017-06-03 NOTE — Telephone Encounter (Signed)
Pt will come pick up samples tomorrow - aware that nurse is working on authorization

## 2017-06-03 NOTE — Telephone Encounter (Signed)
Pt will come tomorrow and pick up samples of Entresto - will forward to Dr Junius Argyle nurse - pt aware that she is still working on authorization

## 2017-06-03 NOTE — Telephone Encounter (Signed)
Pt also made aware of normal stress test results

## 2017-06-07 NOTE — Telephone Encounter (Signed)
PA approved from [facility] for his Sherryll Burger - effective from 06/07/2017 through 04/19/2018.    Attempted to notify patient - lmtcb.

## 2017-06-09 ENCOUNTER — Other Ambulatory Visit: Payer: Self-pay | Admitting: Cardiovascular Disease

## 2017-06-10 ENCOUNTER — Encounter: Payer: Self-pay | Admitting: Cardiovascular Disease

## 2017-06-10 ENCOUNTER — Ambulatory Visit (INDEPENDENT_AMBULATORY_CARE_PROVIDER_SITE_OTHER): Payer: BLUE CROSS/BLUE SHIELD | Admitting: Cardiovascular Disease

## 2017-06-10 VITALS — BP 132/78 | HR 71 | Ht 73.0 in | Wt 333.0 lb

## 2017-06-10 DIAGNOSIS — I519 Heart disease, unspecified: Secondary | ICD-10-CM

## 2017-06-10 DIAGNOSIS — I10 Essential (primary) hypertension: Secondary | ICD-10-CM | POA: Diagnosis not present

## 2017-06-10 DIAGNOSIS — I712 Thoracic aortic aneurysm, without rupture, unspecified: Secondary | ICD-10-CM

## 2017-06-10 DIAGNOSIS — I5042 Chronic combined systolic (congestive) and diastolic (congestive) heart failure: Secondary | ICD-10-CM | POA: Diagnosis not present

## 2017-06-10 LAB — BASIC METABOLIC PANEL
BUN/Creatinine Ratio: 12 (ref 9–20)
BUN: 13 mg/dL (ref 6–24)
CALCIUM: 9.2 mg/dL (ref 8.7–10.2)
CHLORIDE: 104 mmol/L (ref 96–106)
CO2: 22 mmol/L (ref 20–29)
Creatinine, Ser: 1.07 mg/dL (ref 0.76–1.27)
GFR, EST AFRICAN AMERICAN: 98 mL/min/{1.73_m2} (ref >59)
GFR, EST NON AFRICAN AMERICAN: 85 mL/min/{1.73_m2} (ref >59)
Glucose: 99 mg/dL (ref 65–99)
Potassium: 4.3 mmol/L (ref 3.5–5.2)
Sodium: 140 mmol/L (ref 134–144)

## 2017-06-10 NOTE — Patient Instructions (Signed)
Medication Instructions:  Continue all current medications.  Labwork: none  Testing/Procedures: none  Follow-Up: 4 months   Any Other Special Instructions Will Be Listed Below (If Applicable).  If you need a refill on your cardiac medications before your next appointment, please call your pharmacy.\ 

## 2017-06-10 NOTE — Progress Notes (Signed)
SUBJECTIVE: The patient presents for follow-up of chronic systolic heart failure/cardiomyopathy.  Echocardiogram 05/20/17 demonstrated severely reduced left ventricular systolic function with severe left ventricular dilatation and diffuse hypokinesis, LVEF 20-25%, with restrictive diastolic dysfunction and elevated filling pressures.  There is moderate right ventricular dilatation and mild mitral regurgitation.  Nuclear stress test on 06/01/17 showed no current myocardium at jeopardy with no evidence of significant ischemia.  There was a moderate inferior wall defect which was consistent with possible prior infarct versus sub-diaphragmatic attenuation.  He is doing well and denies chest pain.  Shortness of breath has improved and he no longer has orthopnea.  He denies palpitations and leg swelling.  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  Labs 06/09/17: Sodium 140, potassium 4.3, BUN 13, creatinine 1.07, chloride 104, bicarb 22.     Review of Systems: As per "subjective", otherwise negative.  Allergies  Allergen Reactions  . Codeine Other (See Comments)    Severe headache Pt reports being able to take percocet & vicodin    Current Outpatient Medications  Medication Sig Dispense Refill  . aspirin EC 81 MG tablet Take 1 tablet (81 mg total) by mouth daily. 30 tablet 0  . carvedilol (COREG) 6.25 MG tablet Take 1 tablet (6.25 mg total) by mouth 2 (two) times daily with a meal. 180 tablet 3  . furosemide (LASIX) 40 MG tablet Take 1 tablet (40 mg total) by mouth daily. 90 tablet 3  . potassium chloride SA (K-DUR,KLOR-CON) 20 MEQ tablet Take 1 tablet (20 mEq total) by mouth daily. 90 tablet 3  . sacubitril-valsartan (ENTRESTO) 24-26 MG Take 1 tablet by mouth 2 (two) times daily. 28 tablet 0   No current facility-administered medications for this visit.     Past Medical History:  Diagnosis Date  . Cardiomyopathy (HCC)   .  Cardiomyopathy (HCC)   . CHF (congestive heart failure) (HCC)   . Hypertension   . Pneumonia     Past Surgical History:  Procedure Laterality Date  . NO PAST SURGERIES      Social History   Socioeconomic History  . Marital status: Married    Spouse name: Not on file  . Number of children: Not on file  . Years of education: Not on file  . Highest education level: Not on file  Social Needs  . Financial resource strain: Not on file  . Food insecurity - worry: Not on file  . Food insecurity - inability: Not on file  . Transportation needs - medical: Not on file  . Transportation needs - non-medical: Not on file  Occupational History  . Not on file  Tobacco Use  . Smoking status: Never Smoker  . Smokeless tobacco: Current User    Types: Snuff  Substance and Sexual Activity  . Alcohol use: Yes    Frequency: Never    Comment: occas  . Drug use: No  . Sexual activity: Not on file  Other Topics Concern  . Not on file  Social History Narrative  . Not on file     Vitals:   06/10/17 1535  BP: 132/78  Pulse: 71  SpO2: 98%  Weight: (!) 333 lb (151 kg)  Height: 6\' 1"  (1.854 m)    Wt Readings from Last 3 Encounters:  06/10/17 (!) 333 lb (151 kg)  05/17/17 (!) 338 lb (153.3 kg)  04/28/17 (!) 340 lb (154.2 kg)     PHYSICAL EXAM  General: NAD HEENT: Normal. Neck: No JVD, no thyromegaly. Lungs: Clear to auscultation bilaterally with normal respiratory effort. CV: Regular rate and rhythm, normal S1/S2, no S3/S4, no murmur. No pretibial or periankle edema. Abdomen: Firm, protuberant.  Neurologic: Alert and oriented.  Psych: Normal affect. Skin: Normal. Musculoskeletal: No gross deformities.    ECG: Most recent ECG reviewed.   Labs: Lab Results  Component Value Date/Time   K 4.3 06/09/2017 12:46 PM   BUN 13 06/09/2017 12:46 PM   CREATININE 1.07 06/09/2017 12:46 PM   ALT 26 04/28/2017 01:58 PM   HGB 13.4 04/28/2017 01:58 PM     Lipids: No results found  for: LDLCALC, LDLDIRECT, CHOL, TRIG, HDL     ASSESSMENT AND PLAN: 1.  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  He is now on carvedilol and Entresto 24/26 mg twice daily.  He has NYHA class II symptoms. Weight is down 5 pounds since his last visit with me on 05/17/17.  Continue Lasix 40 mg daily with supplemental potassium.  I have made a referral to EP for AICD consideration.  QRS 106 ms on 04/28/17; thus, resynchronization therapy is not indicated.  2.  Chronic hypertension: Blood pressure is controlled.  No changes to therapy.  3.  Mild aneurysmal aortic dilatation: I will monitor.   Disposition: Follow up 4 months   Prentice Docker, M.D., F.A.C.C.

## 2017-06-15 ENCOUNTER — Telehealth: Payer: Self-pay | Admitting: *Deleted

## 2017-06-15 NOTE — Telephone Encounter (Signed)
Patient informed. 

## 2017-06-15 NOTE — Telephone Encounter (Signed)
-----   Message from Albertine Patricia, CMA sent at 06/10/2017  9:44 AM EST -----   ----- Message ----- From: Laqueta Linden, MD Sent: 06/10/2017   9:41 AM To: Albertine Patricia, CMA  Normal

## 2017-07-11 ENCOUNTER — Other Ambulatory Visit: Payer: Self-pay | Admitting: Cardiovascular Disease

## 2017-09-27 ENCOUNTER — Other Ambulatory Visit: Payer: Self-pay

## 2017-09-27 ENCOUNTER — Emergency Department (HOSPITAL_COMMUNITY)
Admission: EM | Admit: 2017-09-27 | Discharge: 2017-09-27 | Disposition: A | Discharge status: Home / Self Care | Payer: BLUE CROSS/BLUE SHIELD | Attending: Emergency Medicine | Admitting: Emergency Medicine

## 2017-09-27 ENCOUNTER — Encounter (HOSPITAL_COMMUNITY): Payer: Self-pay | Admitting: Emergency Medicine

## 2017-09-27 DIAGNOSIS — Z79899 Other long term (current) drug therapy: Secondary | ICD-10-CM | POA: Diagnosis not present

## 2017-09-27 DIAGNOSIS — I110 Hypertensive heart disease with heart failure: Secondary | ICD-10-CM | POA: Insufficient documentation

## 2017-09-27 DIAGNOSIS — K047 Periapical abscess without sinus: Secondary | ICD-10-CM

## 2017-09-27 DIAGNOSIS — I10 Essential (primary) hypertension: Secondary | ICD-10-CM | POA: Insufficient documentation

## 2017-09-27 DIAGNOSIS — K0889 Other specified disorders of teeth and supporting structures: Secondary | ICD-10-CM | POA: Diagnosis present

## 2017-09-27 DIAGNOSIS — Z7982 Long term (current) use of aspirin: Secondary | ICD-10-CM | POA: Insufficient documentation

## 2017-09-27 DIAGNOSIS — I5020 Unspecified systolic (congestive) heart failure: Secondary | ICD-10-CM | POA: Insufficient documentation

## 2017-09-27 MED ORDER — AMOXICILLIN 250 MG PO CAPS
500.0000 mg | ORAL_CAPSULE | Freq: Once | ORAL | Status: AC
  Administered 2017-09-27: 500 mg via ORAL
  Filled 2017-09-27: qty 2

## 2017-09-27 MED ORDER — OXYCODONE-ACETAMINOPHEN 5-325 MG PO TABS: 1.0000 | ORAL_TABLET | ORAL | 0 refills | Status: DC | PRN

## 2017-09-27 MED ORDER — AMOXICILLIN 500 MG PO CAPS: 500.0000 mg | ORAL_CAPSULE | Freq: Three times a day (TID) | ORAL | 0 refills | Status: AC

## 2017-09-27 NOTE — Discharge Instructions (Addendum)
Take the entire course of the antibiotic prescribed, even if your symptoms are improving over the next several days (which they should).  You may take the oxycodone prescribed for pain relief.  This will make you drowsy - do not drive within 4 hours of taking this medication. It is ok to take additional tylenol (acetaminophen) but do not take more than 4000 mg in a day (the oxycodone contains 325 mg per tablet).  Avoid ibuprofen or advil right now as this will elevate your blood pressure.  Have your pressure rechecked once your pain is better - it is elevated right now, probably due to your pain. Make sure you are taking your blood pressure medicines as discussed.

## 2017-09-27 NOTE — ED Triage Notes (Signed)
Patient reports overcrowding of his wisdom teeth that causes swelling and pain in his mouth. Onset of symptoms last night.

## 2017-09-28 NOTE — ED Provider Notes (Signed)
[facility] EMERGENCY DEPARTMENT Provider Note   CSN: [id number] Arrival date & time: 09/27/17  1051     History   Chief Complaint Chief Complaint  Patient presents with  . Dental Pain    HPI Cole Singleton is a 42 y.o. male with a history of HTN, chf and cardiomyopathy presenting with acute dental pain which he states is triggered by his wisdom teeth which are overcrowding his other teeth and need to be removed, however has not been able to have these extractions performed.  He develop worsening pain and swelling along his right lower gingiva last night.  He denies fevers, chills, or drainage from the site.  He has been taking ibuprofen which has not relieved his pain.  The history is provided by the patient.    Past Medical History:  Diagnosis Date  . Cardiomyopathy (HCC)   . Cardiomyopathy (HCC)   . CHF (congestive heart failure) (HCC)   . Hypertension   . Pneumonia     Patient Active Problem List   Diagnosis Date Noted  . Acute respiratory failure with hypoxia (HCC) 10/11/2014  . Anemia 10/10/2014  . Hypokalemia   . Acute systolic heart failure (HCC) 10/09/2014  . Hypertension   . CAP (community acquired pneumonia) 10/07/2014  . Obesity (BMI 35.0-39.9 without comorbidity) 10/07/2014    Past Surgical History:  Procedure Laterality Date  . NO PAST SURGERIES          Home Medications    Prior to Admission medications   Medication Sig Start Date End Date Taking? Authorizing Provider  amoxicillin (AMOXIL) 500 MG capsule Take 1 capsule (500 mg total) by mouth 3 (three) times daily for 10 days. 09/27/17 10/07/17  Burgess Amor, PA-C  aspirin EC 81 MG tablet Take 1 tablet (81 mg total) by mouth daily. 02/09/15   Eber Hong, MD  carvedilol (COREG) 6.25 MG tablet Take 1 tablet (6.25 mg total) by mouth 2 (two) times daily with a meal. 05/17/17   Laqueta Linden, MD  ENTRESTO 24-26 MG TAKE 1 TABLET BY MOUTH TWICE DIALY 07/12/17   Laqueta Linden, MD    furosemide (LASIX) 40 MG tablet Take 1 tablet (40 mg total) by mouth daily. 05/17/17   Laqueta Linden, MD  oxyCODONE-acetaminophen (PERCOCET/ROXICET) 5-325 MG tablet Take 1 tablet by mouth every 4 (four) hours as needed. 09/27/17   Burgess Amor, PA-C  potassium chloride SA (K-DUR,KLOR-CON) 20 MEQ tablet Take 1 tablet (20 mEq total) by mouth daily. 05/17/17   Laqueta Linden, MD    Family History Family History  Problem Relation Age of Onset  . Hypertension Mother   . Hypertension Father     Social History Social History   Tobacco Use  . Smoking status: Never Smoker  . Smokeless tobacco: Current User    Types: Snuff  Substance Use Topics  . Alcohol use: Yes    Frequency: Never    Comment: occas  . Drug use: No     Allergies   Codeine   Review of Systems Review of Systems  Constitutional: Negative for chills and fever.  HENT: Positive for dental problem and facial swelling. Negative for sore throat.   Respiratory: Negative for shortness of breath.   Cardiovascular: Negative for chest pain.  Gastrointestinal: Negative for nausea and vomiting.  Musculoskeletal: Negative for neck pain and neck stiffness.  Neurological: Negative for headaches.     Physical Exam Updated Vital Signs BP (!) 181/116 (BP Location: Left Arm)   Pulse  60   Temp 98.4 F (36.9 C) (Oral)   Resp 15   Ht 6\' 1"  (1.854 m)   Wt (!) 156.5 kg (345 lb)   SpO2 98%   BMI 45.52 kg/m   Physical Exam  Constitutional: He is oriented to person, place, and time. He appears well-developed and well-nourished. No distress.  HENT:  Head: Normocephalic and atraumatic.  Right Ear: Tympanic membrane and external ear normal.  Left Ear: Tympanic membrane and external ear normal.  Mouth/Throat: Oropharynx is clear and moist and mucous membranes are normal. No oral lesions. No trismus in the jaw. Abnormal dentition. Dental caries present. No dental abscesses or uvula swelling.  3rd molars in various  degree of eruption.  Edema along the right lower gingival border, mild erythema without tenting or drainage.  No fluctuance.  Sublingual space soft.  Eyes: Conjunctivae are normal.  Neck: Normal range of motion. Neck supple.  Cardiovascular: Normal rate and normal heart sounds.  Pulmonary/Chest: Effort normal.  Musculoskeletal: Normal range of motion.  Lymphadenopathy:    He has no cervical adenopathy.  Neurological: He is alert and oriented to person, place, and time.  Skin: Skin is warm and dry. No erythema.  Psychiatric: He has a normal mood and affect.     ED Treatments / Results  Labs (all labs ordered are listed, but only abnormal results are displayed) Labs Reviewed - No data to display  EKG None  Radiology No results found.  Procedures Procedures (including critical care time)  Medications Ordered in ED Medications  amoxicillin (AMOXIL) capsule 500 mg (500 mg Oral Given 09/27/17 1200)     Initial Impression / Assessment and Plan / ED Course  I have reviewed the triage vital signs and the nursing notes.  Pertinent labs & imaging results that were available during my care of the patient were reviewed by me and considered in my medical decision making (see chart for details).     Discussed elevated bp - pt denies headache, cp, sob.  He states he has not taken his bp meds this am and will do when he gets home.  Advised to stop advil which can elevate bp.  Amoxil, oxycodone for pain, oral surgery referral given.  No exam findings suggesting ludwigs angina. Dental pain with dental infection right lower, no fluctuant abscess appreciated.  [place] controlled substance database reviewed.   Final Clinical Impressions(s) / ED Diagnoses   Final diagnoses:  Dental abscess  Hypertension, unspecified type    ED Discharge Orders        Ordered    amoxicillin (AMOXIL) 500 MG capsule  3 times daily     09/27/17 1159    oxyCODONE-acetaminophen (PERCOCET/ROXICET) 5-325 MG  tablet  Every 4 hours PRN     09/27/17 1159       Burgess Amor, PA-C 09/28/17 1718    Blane Ohara, MD 10/05/17 [phone]

## 2017-10-12 ENCOUNTER — Ambulatory Visit (INDEPENDENT_AMBULATORY_CARE_PROVIDER_SITE_OTHER): Payer: BLUE CROSS/BLUE SHIELD | Admitting: Cardiovascular Disease

## 2017-10-12 ENCOUNTER — Encounter: Payer: Self-pay | Admitting: Cardiovascular Disease

## 2017-10-12 VITALS — BP 142/100 | HR 71 | Ht 74.0 in | Wt 342.0 lb

## 2017-10-12 DIAGNOSIS — I10 Essential (primary) hypertension: Secondary | ICD-10-CM | POA: Diagnosis not present

## 2017-10-12 DIAGNOSIS — I712 Thoracic aortic aneurysm, without rupture, unspecified: Secondary | ICD-10-CM

## 2017-10-12 DIAGNOSIS — I519 Heart disease, unspecified: Secondary | ICD-10-CM

## 2017-10-12 DIAGNOSIS — I5042 Chronic combined systolic (congestive) and diastolic (congestive) heart failure: Secondary | ICD-10-CM | POA: Diagnosis not present

## 2017-10-12 NOTE — Progress Notes (Signed)
SUBJECTIVE: The patient presents for follow-up of chronic systolic heart failure/cardiomyopathy.  Echocardiogram 05/20/17 demonstrated severely reduced left ventricular systolic function with severe left ventricular dilatation and diffuse hypokinesis, LVEF 20-25%, with restrictive diastolic dysfunction and elevated filling pressures.  There is moderate right ventricular dilatation and mild mitral regurgitation.  Nuclear stress test on 06/01/17 showed no current myocardium at jeopardy with no evidence of significant ischemia.  There was a moderate inferior wall defect which was consistent with possible prior infarct versus sub-diaphragmatic attenuation.  The patient denies any symptoms of chest pain, palpitations, shortness of breath, lightheadedness, dizziness, leg swelling, orthopnea, PND, and syncope.  He took his medications about an hour ago.  He works about 50 to 60 hours/week.  His father-in-law is Milinda Pointer whom I recently took care of in the hospital.  His wife underwent CABG.  He says he has not had time to see EP regarding AICD consideration.    Social history: Profession involves building airplane tires.  Review of Systems: As per "subjective", otherwise negative.  Allergies  Allergen Reactions  . Codeine Other (See Comments)    Severe headache Pt reports being able to take percocet & vicodin    Current Outpatient Medications  Medication Sig Dispense Refill  . aspirin EC 81 MG tablet Take 1 tablet (81 mg total) by mouth daily. 30 tablet 0  . carvedilol (COREG) 6.25 MG tablet Take 1 tablet (6.25 mg total) by mouth 2 (two) times daily with a meal. 180 tablet 3  . ENTRESTO 24-26 MG TAKE 1 TABLET BY MOUTH TWICE DIALY (Patient taking differently: TAKE 1 TABLET BY MOUTH TWICE DIALY - gets from [facility]) 60 tablet 6  . furosemide (LASIX) 40 MG tablet Take 1 tablet (40 mg total) by mouth daily. 90 tablet 3  . oxyCODONE-acetaminophen (PERCOCET/ROXICET) 5-325 MG  tablet Take 1 tablet by mouth every 4 (four) hours as needed. 20 tablet 0  . potassium chloride SA (K-DUR,KLOR-CON) 20 MEQ tablet Take 1 tablet (20 mEq total) by mouth daily. 90 tablet 3   No current facility-administered medications for this visit.     Past Medical History:  Diagnosis Date  . Cardiomyopathy (HCC)   . Cardiomyopathy (HCC)   . CHF (congestive heart failure) (HCC)   . Hypertension   . Pneumonia     Past Surgical History:  Procedure Laterality Date  . NO PAST SURGERIES      Social History   Socioeconomic History  . Marital status: Married    Spouse name: Not on file  . Number of children: Not on file  . Years of education: Not on file  . Highest education level: Not on file  Occupational History  . Not on file  Social Needs  . Financial resource strain: Not on file  . Food insecurity:    Worry: Not on file    Inability: Not on file  . Transportation needs:    Medical: Not on file    Non-medical: Not on file  Tobacco Use  . Smoking status: Never Smoker  . Smokeless tobacco: Current User    Types: Snuff  Substance and Sexual Activity  . Alcohol use: Yes    Frequency: Never    Comment: occas  . Drug use: No  . Sexual activity: Not on file  Lifestyle  . Physical activity:    Days per week: Not on file    Minutes per session: Not on file  . Stress: Not on file  Relationships  .  Social connections:    Talks on phone: Not on file    Gets together: Not on file    Attends religious service: Not on file    Active member of club or organization: Not on file    Attends meetings of clubs or organizations: Not on file    Relationship status: Not on file  . Intimate partner violence:    Fear of current or ex partner: Not on file    Emotionally abused: Not on file    Physically abused: Not on file    Forced sexual activity: Not on file  Other Topics Concern  . Not on file  Social History Narrative  . Not on file     Vitals:   10/12/17 1040  BP:  (!) 142/100  Pulse: 71  SpO2: 96%  Weight: (!) 342 lb (155.1 kg)  Height: 6\' 2"  (1.88 m)    Wt Readings from Last 3 Encounters:  10/12/17 (!) 342 lb (155.1 kg)  09/27/17 (!) 345 lb (156.5 kg)  06/10/17 (!) 333 lb (151 kg)     PHYSICAL EXAM General: NAD HEENT: Normal. Neck: No JVD, no thyromegaly. Lungs: Clear to auscultation bilaterally with normal respiratory effort. CV: Regular rate and rhythm, normal S1/S2, no S3/S4, no murmur. No pretibial or periankle edema.     Abdomen: Soft, nontender, obese.  Neurologic: Alert and oriented.  Psych: Normal affect. Skin: Normal. Musculoskeletal: No gross deformities.    ECG: Most recent ECG reviewed.   Labs: Lab Results  Component Value Date/Time   K 4.3 06/09/2017 12:46 PM   BUN 13 06/09/2017 12:46 PM   CREATININE 1.07 06/09/2017 12:46 PM   ALT 26 04/28/2017 01:58 PM   HGB 13.4 04/28/2017 01:58 PM     Lipids: No results found for: LDLCALC, LDLDIRECT, CHOL, TRIG, HDL     ASSESSMENT AND PLAN: 1.  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  He has NYHA class II symptoms.  Left ventricular systolic function is severely reduced as noted above.  He is on carvedilol and Entresto 24/26 mg twice daily.  He takes 40 mg of Lasix daily with supplemental potassium.  I previously made a referral to EP for AICD consideration.  QRS 106 ms on 04/28/2017; thus, resynchronization therapy is not indicated.  I encouraged him to follow-up on this.  2.  Chronic hypertension: Blood pressure is elevated.  He took his medications an hour ago.  I asked that he purchase a blood pressure monitor and check blood pressure readings 3 times per week for the next month and to provide me with these values so that I can determine if further antihypertensive titration is indicated.  3.  Mild aneurysmal aortic dilatation: I will monitor.      Disposition: Follow up 6 months   Prentice Docker, M.D.,  F.A.C.C.

## 2017-10-12 NOTE — Patient Instructions (Addendum)
Medication Instructions:  Continue all current medications.  Labwork: none  Testing/Procedures: none  Follow-Up: Your physician wants you to follow up in: 6 months.  You will receive a reminder letter in the mail one-two months in advance.  If you don't receive a letter, please call our office to schedule the follow up appointment   Any Other Special Instructions Will Be Listed Below (If Applicable). Your physician has requested that you regularly monitor and record your blood pressure readings at home. Please log readings 3 x week for 1 month & return to office for MD review.    If you need a refill on your cardiac medications before your next appointment, please call your pharmacy.

## 2018-03-12 ENCOUNTER — Other Ambulatory Visit: Payer: Self-pay | Admitting: Cardiovascular Disease

## 2018-03-31 ENCOUNTER — Other Ambulatory Visit: Payer: Self-pay | Admitting: *Deleted

## 2018-03-31 MED ORDER — SACUBITRIL-VALSARTAN 24-26 MG PO TABS: ORAL_TABLET | ORAL | 0 refills | Status: DC

## 2018-05-25 ENCOUNTER — Other Ambulatory Visit: Payer: Self-pay | Admitting: Cardiovascular Disease

## 2018-05-25 MED ORDER — SACUBITRIL-VALSARTAN 24-26 MG PO TABS: ORAL_TABLET | ORAL | 0 refills | Status: DC

## 2018-05-25 MED ORDER — FUROSEMIDE 40 MG PO TABS: 40.0000 mg | ORAL_TABLET | Freq: Every day | ORAL | 0 refills | Status: DC

## 2018-05-25 MED ORDER — POTASSIUM CHLORIDE CRYS ER 20 MEQ PO TBCR: 20.0000 meq | EXTENDED_RELEASE_TABLET | Freq: Every day | ORAL | 0 refills | Status: DC

## 2018-05-25 MED ORDER — CARVEDILOL 6.25 MG PO TABS: 6.2500 mg | ORAL_TABLET | Freq: Two times a day (BID) | ORAL | 0 refills | Status: DC

## 2018-05-25 NOTE — Telephone Encounter (Signed)
Patient called requesting refills on all heart medications.   [facility] Other heart meds to be called to [facility], [place]   Patient has upcoming appointment with Dr. Purvis Sheffield

## 2018-06-23 ENCOUNTER — Ambulatory Visit (INDEPENDENT_AMBULATORY_CARE_PROVIDER_SITE_OTHER): Payer: BLUE CROSS/BLUE SHIELD | Admitting: Cardiovascular Disease

## 2018-06-23 ENCOUNTER — Encounter: Payer: Self-pay | Admitting: Cardiovascular Disease

## 2018-06-23 VITALS — BP 135/85 | HR 66 | Ht 74.0 in | Wt 345.4 lb

## 2018-06-23 DIAGNOSIS — I712 Thoracic aortic aneurysm, without rupture, unspecified: Secondary | ICD-10-CM

## 2018-06-23 DIAGNOSIS — I519 Heart disease, unspecified: Secondary | ICD-10-CM

## 2018-06-23 DIAGNOSIS — I5042 Chronic combined systolic (congestive) and diastolic (congestive) heart failure: Secondary | ICD-10-CM | POA: Diagnosis not present

## 2018-06-23 DIAGNOSIS — I10 Essential (primary) hypertension: Secondary | ICD-10-CM

## 2018-06-23 MED ORDER — FUROSEMIDE 40 MG PO TABS: 40.0000 mg | ORAL_TABLET | Freq: Every day | ORAL | 1 refills | Status: DC

## 2018-06-23 MED ORDER — SACUBITRIL-VALSARTAN 24-26 MG PO TABS: ORAL_TABLET | ORAL | 1 refills | Status: DC

## 2018-06-23 MED ORDER — POTASSIUM CHLORIDE CRYS ER 20 MEQ PO TBCR: 20.0000 meq | EXTENDED_RELEASE_TABLET | Freq: Every day | ORAL | 1 refills | Status: DC

## 2018-06-23 MED ORDER — CARVEDILOL 6.25 MG PO TABS: 6.2500 mg | ORAL_TABLET | Freq: Two times a day (BID) | ORAL | 1 refills | Status: DC

## 2018-06-23 NOTE — Patient Instructions (Signed)

## 2018-06-23 NOTE — Progress Notes (Signed)
SUBJECTIVE: The patient presents for routine follow-up.  ECG performed today which I ordered and personally interpreted demonstrates sinus rhythm with nonspecific T wave abnormalities in high lateral leads.  Echocardiogram 05/20/17 demonstrated severely reduced left ventricular systolic function with severe left ventricular dilatation and diffuse hypokinesis, LVEF 20-25%, with restrictive diastolic dysfunction and elevated filling pressures. There is moderate right ventricular dilatation and mild mitral regurgitation.  Nuclear stress test on 06/01/17 showed no current myocardium at jeopardy with no evidence of significant ischemia. There was a moderate inferior wall defect which was consistent with possible prior infarct versus sub-diaphragmatic attenuation.  He still has not followed up with EP regarding ICD evaluation.  The patient denies any symptoms of chest pain, palpitations, shortness of breath, lightheadedness, dizziness, leg swelling, orthopnea, PND, and syncope.    Social history: His father-in-law is Dimas Aguas Bullins whom I have taken care of. Profession involves building airplane tires.  Review of Systems: As per "subjective", otherwise negative.  Allergies  Allergen Reactions  . Codeine Other (See Comments)    Severe headache Pt reports being able to take percocet & vicodin    Current Outpatient Medications  Medication Sig Dispense Refill  . aspirin EC 81 MG tablet Take 1 tablet (81 mg total) by mouth daily. 30 tablet 0  . carvedilol (COREG) 6.25 MG tablet Take 1 tablet (6.25 mg total) by mouth 2 (two) times daily with a meal. 60 tablet 0  . furosemide (LASIX) 40 MG tablet Take 1 tablet (40 mg total) by mouth daily. 30 tablet 0  . oxyCODONE-acetaminophen (PERCOCET/ROXICET) 5-325 MG tablet Take 1 tablet by mouth every 4 (four) hours as needed. 20 tablet 0  . potassium chloride SA (K-DUR,KLOR-CON) 20 MEQ tablet Take 1 tablet (20 mEq total) by mouth daily. 30 tablet  0  . sacubitril-valsartan (ENTRESTO) 24-26 MG TAKE 1 TABLET BY MOUTH TWICE DIALY - gets from [facility] 60 tablet 0   No current facility-administered medications for this visit.     Past Medical History:  Diagnosis Date  . Cardiomyopathy (HCC)   . Cardiomyopathy (HCC)   . CHF (congestive heart failure) (HCC)   . Hypertension   . Pneumonia     Past Surgical History:  Procedure Laterality Date  . NO PAST SURGERIES      Social History   Socioeconomic History  . Marital status: Married    Spouse name: Not on file  . Number of children: Not on file  . Years of education: Not on file  . Highest education level: Not on file  Occupational History  . Not on file  Social Needs  . Financial resource strain: Not on file  . Food insecurity:    Worry: Not on file    Inability: Not on file  . Transportation needs:    Medical: Not on file    Non-medical: Not on file  Tobacco Use  . Smoking status: Never Smoker  . Smokeless tobacco: Current User    Types: Snuff  Substance and Sexual Activity  . Alcohol use: Yes    Frequency: Never    Comment: occas  . Drug use: No  . Sexual activity: Not on file  Lifestyle  . Physical activity:    Days per week: Not on file    Minutes per session: Not on file  . Stress: Not on file  Relationships  . Social connections:    Talks on phone: Not on file    Gets together: Not on file  Attends religious service: Not on file    Active member of club or organization: Not on file    Attends meetings of clubs or organizations: Not on file    Relationship status: Not on file  . Intimate partner violence:    Fear of current or ex partner: Not on file    Emotionally abused: Not on file    Physically abused: Not on file    Forced sexual activity: Not on file  Other Topics Concern  . Not on file  Social History Narrative  . Not on file     Vitals:   06/23/18 1017  BP: 135/85  Pulse: 66  SpO2: 97%  Weight: (!) 345 lb 6.4 oz (156.7 kg)   Height: 6\' 2"  (1.88 m)    Wt Readings from Last 3 Encounters:  06/23/18 (!) 345 lb 6.4 oz (156.7 kg)  10/12/17 (!) 342 lb (155.1 kg)  09/27/17 (!) 345 lb (156.5 kg)     PHYSICAL EXAM General: NAD HEENT: Normal. Neck: No JVD, no thyromegaly. Lungs: Clear to auscultation bilaterally with normal respiratory effort. CV: Regular rate and rhythm, normal S1/S2, no S3/S4, no murmur. No pretibial or periankle edema.  No carotid bruit.   Abdomen: Soft, nontender, obese.  Neurologic: Alert and oriented.  Psych: Normal affect. Skin: Normal. Musculoskeletal: No gross deformities.    ECG: Reviewed above under Subjective   Labs: Lab Results  Component Value Date/Time   K 4.3 06/09/2017 12:46 PM   BUN 13 06/09/2017 12:46 PM   CREATININE 1.07 06/09/2017 12:46 PM   ALT 26 04/28/2017 01:58 PM   HGB 13.4 04/28/2017 01:58 PM     Lipids: No results found for: LDLCALC, LDLDIRECT, CHOL, TRIG, HDL     ASSESSMENT AND PLAN: 1.  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  He has NYHA class II symptoms.  Left ventricular systolic function is severely reduced as noted above.  He is on carvedilol and Entresto 24/26 mg twice daily.  He takes 40 mg of Lasix daily with supplemental potassium.  I previously made a referral to EP for AICD consideration.  QRS 104 ms today; thus, resynchronization therapy is not indicated.  I encouraged him to follow-up with EP.  2.  Chronic hypertension: Blood pressure is normal. No changes.  3.  Mild aneurysmal aortic dilatation: I will monitor.    Disposition: Follow up 6 months   Prentice Docker, M.D., F.A.C.C.

## 2018-06-23 NOTE — Addendum Note (Signed)
Addended by: Burman Nieves T on: 06/23/2018 10:43 AM   Modules accepted: Orders

## 2018-11-08 ENCOUNTER — Telehealth: Payer: Self-pay | Admitting: Cardiovascular Disease

## 2018-11-08 ENCOUNTER — Encounter: Payer: Self-pay | Admitting: *Deleted

## 2018-11-08 NOTE — Telephone Encounter (Signed)
Patient followed by Dr Bronson Ing, chart reviewed. He does have severe heart failure and his issues with wearing a mask in hot temperatures is understandable. Ok to send a note to his work with the following:   To whom it may concern,  Mr Cole Singleton is followed in our cardiology clinic for a severe heart condition. Due to the degree of his heart condition he may have difficultly performing mild to moderate levels of exertion in warm or hot temperatures, and may have even more troubles with these activities while wearing a mask. Certainly mask wearing and social distancing are important at this time not only for [name] but also for all employees. Please consider any alternative work conditions that may help [name], such as performing his duties in cooler temperatures, performing lesser levels of manual labor, or allowing him to work without a mask during heavier levels of labor but with enhanced social distancing from his coworkers up to 6 feet or more.    Carlyle Dolly MD

## 2018-11-08 NOTE — Telephone Encounter (Signed)
Pt aware and appreciative - will come by tomorrow and call so we can bring letter out to him

## 2018-11-08 NOTE — Telephone Encounter (Signed)
Patient called asking for a work note stating that he can not wear face mask due to health conditions.  Said that when he wears it he gets light headed and nauseous

## 2018-11-08 NOTE — Telephone Encounter (Signed)
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

## 2019-01-23 ENCOUNTER — Other Ambulatory Visit: Payer: Self-pay | Admitting: Cardiovascular Disease

## 2019-04-24 ENCOUNTER — Other Ambulatory Visit: Payer: Self-pay | Admitting: Cardiovascular Disease

## 2019-05-22 ENCOUNTER — Other Ambulatory Visit: Payer: Self-pay | Admitting: Cardiovascular Disease

## 2019-06-12 ENCOUNTER — Other Ambulatory Visit: Payer: Self-pay | Admitting: *Deleted

## 2019-06-12 MED ORDER — POTASSIUM CHLORIDE CRYS ER 20 MEQ PO TBCR: 20.0000 meq | EXTENDED_RELEASE_TABLET | Freq: Every day | ORAL | 0 refills | Status: DC

## 2019-06-12 MED ORDER — CARVEDILOL 6.25 MG PO TABS: ORAL_TABLET | ORAL | 0 refills | Status: DC

## 2019-06-12 MED ORDER — FUROSEMIDE 40 MG PO TABS: 40.0000 mg | ORAL_TABLET | Freq: Every day | ORAL | 0 refills | Status: DC

## 2019-06-12 MED ORDER — ENTRESTO 24-26 MG PO TABS: ORAL_TABLET | ORAL | 0 refills | Status: DC

## 2019-06-12 NOTE — Telephone Encounter (Signed)
Appointment scheduled for 07/11/2019 @1 :40 pm with Dr. . Patient asking for another letter with with specific reasons as to why he can't wear a mask. Advised that the letter given in July 2020 was very specific. Advised to have HR staff to send request of exactly what she is asking for. Verbalized understanding.

## 2019-06-13 ENCOUNTER — Telehealth: Payer: Self-pay | Admitting: Cardiovascular Disease

## 2019-06-13 ENCOUNTER — Encounter: Payer: Self-pay | Admitting: *Deleted

## 2019-06-13 NOTE — Telephone Encounter (Signed)
Sure, that would be fine.

## 2019-06-13 NOTE — Telephone Encounter (Signed)
Calling about having to wear a mask at work. HR telling him that he has to get an updated note from [place] about him not having to wear a mask due to medical reason  FAX [phone]   Atnn:   Earlene Plater with Human Resources

## 2019-06-13 NOTE — Telephone Encounter (Signed)
Updated letter faxed to [phone]   Attention:  Earlene Plater with Human Resources

## 2019-07-11 ENCOUNTER — Ambulatory Visit (INDEPENDENT_AMBULATORY_CARE_PROVIDER_SITE_OTHER): Payer: Managed Care, Other (non HMO) | Admitting: Cardiovascular Disease

## 2019-07-11 ENCOUNTER — Other Ambulatory Visit: Payer: Self-pay

## 2019-07-11 ENCOUNTER — Encounter: Payer: Self-pay | Admitting: Cardiovascular Disease

## 2019-07-11 VITALS — BP 160/110 | HR 80 | Ht 74.0 in | Wt 387.8 lb

## 2019-07-11 DIAGNOSIS — I5042 Chronic combined systolic (congestive) and diastolic (congestive) heart failure: Secondary | ICD-10-CM | POA: Diagnosis not present

## 2019-07-11 DIAGNOSIS — I519 Heart disease, unspecified: Secondary | ICD-10-CM | POA: Diagnosis not present

## 2019-07-11 DIAGNOSIS — I10 Essential (primary) hypertension: Secondary | ICD-10-CM

## 2019-07-11 DIAGNOSIS — I712 Thoracic aortic aneurysm, without rupture, unspecified: Secondary | ICD-10-CM

## 2019-07-11 MED ORDER — CARVEDILOL 6.25 MG PO TABS: ORAL_TABLET | ORAL | 3 refills | Status: DC

## 2019-07-11 MED ORDER — FUROSEMIDE 40 MG PO TABS: 40.0000 mg | ORAL_TABLET | Freq: Every day | ORAL | 3 refills | Status: DC

## 2019-07-11 MED ORDER — OMEPRAZOLE MAGNESIUM 20 MG PO TBEC: 20.0000 mg | DELAYED_RELEASE_TABLET | Freq: Every day | ORAL | 3 refills | Status: DC

## 2019-07-11 MED ORDER — SACUBITRIL-VALSARTAN 49-51 MG PO TABS: 1.0000 | ORAL_TABLET | Freq: Two times a day (BID) | ORAL | 3 refills | Status: DC

## 2019-07-11 MED ORDER — POTASSIUM CHLORIDE CRYS ER 20 MEQ PO TBCR: 20.0000 meq | EXTENDED_RELEASE_TABLET | Freq: Every day | ORAL | 3 refills | Status: DC

## 2019-07-11 NOTE — Progress Notes (Signed)
SUBJECTIVE: The patient presents for follow-up of chronic combined heart failure.  Echocardiogram 05/20/17 demonstrated severely reduced left ventricular systolic function with severe left ventricular dilatation and diffuse hypokinesis, LVEF 20-25%, with restrictive diastolic dysfunction and elevated filling pressures. There is moderate right ventricular dilatation and mild mitral regurgitation.  Nuclear stress test on 06/01/17 showed no current myocardium at jeopardy with no evidence of significant ischemia. There was a moderate inferior wall defect which was consistent with possible prior infarct versus sub-diaphragmatic attenuation.  He still has not followed up with EP regarding ICD evaluation.  He denies chest pain, palpitations, leg swelling.  He seldom gets short of breath.  He has had to use an extra dose of Lasix on 3 occasions since his last visit with me in March 2020.  He has occasional problems with seasonal allergies.  He had questions about taking the COVID-19 vaccine.  I personally reviewed ECG performed today which demonstrates sinus rhythm with diffuse nonspecific ST segment and T wave abnormalities.    Social history: His father-in-law is Dimas Aguas Bullins whom I have taken care of. Profession involves building airplane tires.  Review of Systems: As per "subjective", otherwise negative.  Allergies  Allergen Reactions  . Codeine Other (See Comments)    Severe headache Pt reports being able to take percocet & vicodin    Current Outpatient Medications  Medication Sig Dispense Refill  . aspirin EC 81 MG tablet Take 1 tablet (81 mg total) by mouth daily. 30 tablet 0  . carvedilol (COREG) 6.25 MG tablet TAKE 1 TABLET BY MOUTH TWICE DAILY WITH A MEAL 60 tablet 0  . furosemide (LASIX) 40 MG tablet Take 1 tablet (40 mg total) by mouth daily. 30 tablet 0  . omeprazole (PRILOSEC OTC) 20 MG tablet Take 20 mg by mouth daily.    . potassium chloride SA (KLOR-CON) 20  MEQ tablet Take 1 tablet (20 mEq total) by mouth daily. 30 tablet 0  . sacubitril-valsartan (ENTRESTO) 24-26 MG TAKE 1 TABLET BY MOUTH TWICE DIALY 60 tablet 0   No current facility-administered medications for this visit.    Past Medical History:  Diagnosis Date  . Cardiomyopathy (HCC)   . Cardiomyopathy (HCC)   . CHF (congestive heart failure) (HCC)   . Hypertension   . Pneumonia     Past Surgical History:  Procedure Laterality Date  . NO PAST SURGERIES      Social History   Socioeconomic History  . Marital status: Married    Spouse name: Not on file  . Number of children: Not on file  . Years of education: Not on file  . Highest education level: Not on file  Occupational History  . Not on file  Tobacco Use  . Smoking status: Never Smoker  . Smokeless tobacco: Current User    Types: Snuff  Substance and Sexual Activity  . Alcohol use: Yes    Comment: occas  . Drug use: No  . Sexual activity: Not on file  Other Topics Concern  . Not on file  Social History Narrative  . Not on file   Social Determinants of Health   Financial Resource Strain:   . Difficulty of Paying Living Expenses:   Food Insecurity:   . Worried About Programme researcher, broadcasting/film/video in the Last Year:   . Barista in the Last Year:   Transportation Needs:   . Freight forwarder (Medical):   Marland Kitchen Lack of Transportation (Non-Medical):  Physical Activity:   . Days of Exercise per Week:   . Minutes of Exercise per Session:   Stress:   . Feeling of Stress :   Social Connections:   . Frequency of Communication with Friends and Family:   . Frequency of Social Gatherings with Friends and Family:   . Attends Religious Services:   . Active Member of Clubs or Organizations:   . Attends Archivist Meetings:   Marland Kitchen Marital Status:   Intimate Partner Violence:   . Fear of Current or Ex-Partner:   . Emotionally Abused:   Marland Kitchen Physically Abused:   . Sexually Abused:      Vitals:   07/11/19  1330  BP: (!) 160/110  Pulse: 80  SpO2: 98%  Weight: (!) 387 lb 12.8 oz (175.9 kg)  Height: 6\' 2"  (1.88 m)    Wt Readings from Last 3 Encounters:  07/11/19 (!) 387 lb 12.8 oz (175.9 kg)  06/23/18 (!) 345 lb 6.4 oz (156.7 kg)  10/12/17 (!) 342 lb (155.1 kg)     PHYSICAL EXAM General: NAD HEENT: Normal. Neck: No JVD, no thyromegaly. Lungs: Clear to auscultation bilaterally with normal respiratory effort. CV: Regular rate and rhythm, normal S1/S2, no S3/S4, no murmur. No pretibial or periankle edema.  No carotid bruit.   Abdomen: Soft, nontender, no distention.  Neurologic: Alert and oriented.  Psych: Normal affect. Skin: Normal. Musculoskeletal: No gross deformities.      Labs: Lab Results  Component Value Date/Time   K 4.3 06/09/2017 12:46 PM   BUN 13 06/09/2017 12:46 PM   CREATININE 1.07 06/09/2017 12:46 PM   ALT 26 04/28/2017 01:58 PM   HGB 13.4 04/28/2017 01:58 PM     Lipids: No results found for: LDLCALC, LDLDIRECT, CHOL, TRIG, HDL     ASSESSMENT AND PLAN:  1. 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 He has NYHA class II symptoms. Left ventricular systolic function is severely reduced as noted above. He is on carvedilol and Entresto 24/26 mg twice daily.  Given his significantly elevated blood pressure, I will increase Entresto to 49-51 mg twice daily. He takes 40 mg of Lasix daily with supplemental potassium. I previously made a referral to EP for AICD consideration. I encouraged him to follow-up with EP.  2. Hypertension: Blood pressure is significantly elevated today.  I will increase Entresto to 49-51 mg twice daily.  I asked him to keep a blood pressure log over the next several weeks.  3. Mild aneurysmal aortic dilatation: I will monitor.   Disposition: Follow up 6 months   Kate Sable, M.D., F.A.C.C.

## 2019-07-11 NOTE — Patient Instructions (Signed)
Medication Instructions:   Increase Entresto to 49/51mg  twice a day.   Continue all other medications.    Labwork: none  Testing/Procedures: none  Follow-Up: 6 months - phone   Any Other Special Instructions Will Be Listed Below (If Applicable). Your physician has requested that you regularly monitor and record your blood pressure readings at home. Please take readings 3 x week for 3-4 weeks & return log to office for MD review.    If you need a refill on your cardiac medications before your next appointment, please call your pharmacy.

## 2019-07-11 NOTE — Addendum Note (Signed)
Addended by: Lesle Chris on: 07/11/2019 02:22 PM   Modules accepted: Orders

## 2019-09-26 ENCOUNTER — Emergency Department (HOSPITAL_COMMUNITY)
Admission: EM | Admit: 2019-09-26 | Discharge: 2019-09-26 | Disposition: A | Discharge status: Home / Self Care | Payer: Managed Care, Other (non HMO) | Attending: Emergency Medicine | Admitting: Emergency Medicine

## 2019-09-26 ENCOUNTER — Emergency Department (HOSPITAL_COMMUNITY): Payer: Managed Care, Other (non HMO)

## 2019-09-26 ENCOUNTER — Other Ambulatory Visit: Payer: Self-pay

## 2019-09-26 ENCOUNTER — Encounter (HOSPITAL_COMMUNITY): Payer: Self-pay | Admitting: Emergency Medicine

## 2019-09-26 DIAGNOSIS — Z7982 Long term (current) use of aspirin: Secondary | ICD-10-CM | POA: Insufficient documentation

## 2019-09-26 DIAGNOSIS — I110 Hypertensive heart disease with heart failure: Secondary | ICD-10-CM | POA: Diagnosis not present

## 2019-09-26 DIAGNOSIS — Z79899 Other long term (current) drug therapy: Secondary | ICD-10-CM | POA: Insufficient documentation

## 2019-09-26 DIAGNOSIS — I5021 Acute systolic (congestive) heart failure: Secondary | ICD-10-CM | POA: Insufficient documentation

## 2019-09-26 DIAGNOSIS — K8020 Calculus of gallbladder without cholecystitis without obstruction: Secondary | ICD-10-CM | POA: Insufficient documentation

## 2019-09-26 DIAGNOSIS — K760 Fatty (change of) liver, not elsewhere classified: Secondary | ICD-10-CM

## 2019-09-26 DIAGNOSIS — R109 Unspecified abdominal pain: Secondary | ICD-10-CM | POA: Diagnosis present

## 2019-09-26 LAB — COMPREHENSIVE METABOLIC PANEL
ALT: 71 U/L — ABNORMAL HIGH (ref 0–44)
AST: 60 U/L — ABNORMAL HIGH (ref 15–41)
Albumin: 3.8 g/dL (ref 3.5–5.0)
Alkaline Phosphatase: 81 U/L (ref 38–126)
Anion gap: 10 (ref 5–15)
BUN: 11 mg/dL (ref 6–20)
CO2: 26 mmol/L (ref 22–32)
Calcium: 8.9 mg/dL (ref 8.9–10.3)
Chloride: 102 mmol/L (ref 98–111)
Creatinine, Ser: 0.95 mg/dL (ref 0.61–1.24)
GFR calc Af Amer: >60 mL/min (ref >60)
GFR calc non Af Amer: >60 mL/min (ref >60)
Glucose, Bld: 96 mg/dL (ref 70–99)
Potassium: 3.5 mmol/L (ref 3.5–5.1)
Sodium: 138 mmol/L (ref 135–145)
Total Bilirubin: 0.7 mg/dL (ref 0.3–1.2)
Total Protein: 7.4 g/dL (ref 6.5–8.1)

## 2019-09-26 LAB — CBC WITH DIFFERENTIAL/PLATELET
Abs Immature Granulocytes: 0.03 10*3/uL (ref 0.00–0.07)
Basophils Absolute: 0 10*3/uL (ref 0.0–0.1)
Basophils Relative: 1 %
Eosinophils Absolute: 0.1 10*3/uL (ref 0.0–0.5)
Eosinophils Relative: 1 %
HCT: 45.6 % (ref 39.0–52.0)
Hemoglobin: 14.3 g/dL (ref 13.0–17.0)
Immature Granulocytes: 0 %
Lymphocytes Relative: 18 %
Lymphs Abs: 1.3 10*3/uL (ref 0.7–4.0)
MCH: 25.2 pg — ABNORMAL LOW (ref 26.0–34.0)
MCHC: 31.4 g/dL (ref 30.0–36.0)
MCV: 80.3 fL (ref 80.0–100.0)
Monocytes Absolute: 0.4 10*3/uL (ref 0.1–1.0)
Monocytes Relative: 6 %
Neutro Abs: 5.4 10*3/uL (ref 1.7–7.7)
Neutrophils Relative %: 74 %
Platelets: 269 10*3/uL (ref 150–400)
RBC: 5.68 MIL/uL (ref 4.22–5.81)
RDW: 16.7 % — ABNORMAL HIGH (ref 11.5–15.5)
WBC: 7.3 10*3/uL (ref 4.0–10.5)
nRBC: 0 % (ref 0.0–0.2)

## 2019-09-26 LAB — URINALYSIS, ROUTINE W REFLEX MICROSCOPIC
Bacteria, UA: NONE SEEN
Bilirubin Urine: NEGATIVE
Glucose, UA: NEGATIVE mg/dL
Hgb urine dipstick: NEGATIVE
Ketones, ur: NEGATIVE mg/dL
Nitrite: NEGATIVE
Protein, ur: NEGATIVE mg/dL
Specific Gravity, Urine: 1.024 (ref 1.005–1.030)
pH: 6 (ref 5.0–8.0)

## 2019-09-26 MED ORDER — HYDROCODONE-ACETAMINOPHEN 5-325 MG PO TABS: 1.0000 | ORAL_TABLET | ORAL | 0 refills | Status: DC | PRN

## 2019-09-26 NOTE — Discharge Instructions (Addendum)
As discussed we recommend you following up with Dr. Henreitta Leber who is a general surgeon to help determine whether these episodes are secondary to passage of gallstones as you do have this finding on your CT scan today.  You have been prescribed a small quantity of hydrocodone which she may take if you have another episode of this pain.  This medication will make you drowsy, do not drive within 4 hours of taking this medication.  I also advise to minimize high fat content food which can worsen your pain symptoms if indeed these are gallbladder attacks.

## 2019-09-26 NOTE — ED Provider Notes (Signed)
[facility] EMERGENCY DEPARTMENT Provider Note   CSN: [id number] Arrival date & time: 09/26/19  1240     History Chief Complaint  Patient presents with  . Flank Pain    right    Cole Singleton is a 44 y.o. male with a history of cardiomyopathy resulting in persistent CHF, hypertension presenting with sudden onset of intermittent sharp stabbing pain in his right flank which started yesterday, although states he has had several other brief attacks in recent months.  Initially he felt that it was a deep muscle spasm at the site, however the spasm sensation is no longer there, instead he feels intermittent deep stabs of pain without radiation.  The last episode of this pain was around 11 PM last night.  It is not associated with nausea, vomiting, fevers, chills, dysuria or hematuria.  He also denies increased urinary frequency.  No personal or family history of kidney stones.  The pain is not pleuritic and he denies shortness of breath.  He has had no treatment for this pain prior to arrival.  HPI     Past Medical History:  Diagnosis Date  . Cardiomyopathy (HCC)   . Cardiomyopathy (HCC)   . CHF (congestive heart failure) (HCC)   . Hypertension   . Pneumonia     Patient Active Problem List   Diagnosis Date Noted  . Acute respiratory failure with hypoxia (HCC) 10/11/2014  . Anemia 10/10/2014  . Hypokalemia   . Acute systolic heart failure (HCC) 10/09/2014  . Hypertension   . CAP (community acquired pneumonia) 10/07/2014  . Obesity (BMI 35.0-39.9 without comorbidity) 10/07/2014    Past Surgical History:  Procedure Laterality Date  . NO PAST SURGERIES         Family History  Problem Relation Age of Onset  . Hypertension Mother   . Hypertension Father     Social History   Tobacco Use  . Smoking status: Never Smoker  . Smokeless tobacco: Current User    Types: Snuff  Substance Use Topics  . Alcohol use: Yes    Comment: occas  . Drug use: No    Home  Medications Prior to Admission medications   Medication Sig Start Date End Date Taking? Authorizing Provider  aspirin EC 81 MG tablet Take 1 tablet (81 mg total) by mouth daily. 02/09/15   Eber Hong, MD  carvedilol (COREG) 6.25 MG tablet TAKE 1 TABLET BY MOUTH TWICE DAILY WITH A MEAL 07/11/19   Laqueta Linden, MD  furosemide (LASIX) 40 MG tablet Take 1 tablet (40 mg total) by mouth daily. 07/11/19   Laqueta Linden, MD  HYDROcodone-acetaminophen (NORCO/VICODIN) 5-325 MG tablet Take 1 tablet by mouth every 4 (four) hours as needed for moderate pain. 09/26/19   Burgess Amor, PA-C  omeprazole (PRILOSEC OTC) 20 MG tablet Take 1 tablet (20 mg total) by mouth daily. 07/11/19   Laqueta Linden, MD  potassium chloride SA (KLOR-CON) 20 MEQ tablet Take 1 tablet (20 mEq total) by mouth daily. 07/11/19   Laqueta Linden, MD  sacubitril-valsartan (ENTRESTO) 49-51 MG Take 1 tablet by mouth 2 (two) times daily. 07/11/19   Laqueta Linden, MD    Allergies    Codeine  Review of Systems   Review of Systems  Constitutional: Negative for chills and fever.  HENT: Negative.   Eyes: Negative.   Respiratory: Negative for chest tightness and shortness of breath.   Cardiovascular: Negative for chest pain.  Gastrointestinal: Negative for abdominal pain, constipation, diarrhea,  nausea and vomiting.  Genitourinary: Positive for flank pain. Negative for dysuria, hematuria, testicular pain and urgency.  Musculoskeletal: Negative for arthralgias, joint swelling and neck pain.  Skin: Negative.  Negative for rash and wound.  Neurological: Negative for dizziness, weakness, light-headedness, numbness and headaches.  Psychiatric/Behavioral: Negative.     Physical Exam Updated Vital Signs BP (!) 152/104 (BP Location: Right Arm)   Pulse 82   Temp 98.1 F (36.7 C) (Oral)   Resp 18   Ht 6\' 2"  (1.88 m)   Wt (!) 172.4 kg   SpO2 97%   BMI 48.79 kg/m   Physical Exam Vitals and nursing note  reviewed.  Constitutional:      Appearance: He is well-developed.  HENT:     Head: Normocephalic and atraumatic.  Eyes:     Conjunctiva/sclera: Conjunctivae normal.  Cardiovascular:     Rate and Rhythm: Normal rate and regular rhythm.     Heart sounds: Normal heart sounds.  Pulmonary:     Effort: Pulmonary effort is normal.     Breath sounds: Normal breath sounds. No wheezing.  Abdominal:     General: Bowel sounds are normal.     Palpations: Abdomen is soft.     Tenderness: There is no abdominal tenderness. There is no guarding. Negative signs include Murphy's sign and McBurney's sign.     Comments: Pain localizing to the right flank but not reproducible on exam.  No CVA tenderness.  Musculoskeletal:        General: Normal range of motion.     Cervical back: Normal range of motion.  Skin:    General: Skin is warm and dry.  Neurological:     Mental Status: He is alert.     ED Results / Procedures / Treatments   Labs (all labs ordered are listed, but only abnormal results are displayed) Labs Reviewed  URINALYSIS, ROUTINE W REFLEX MICROSCOPIC - Abnormal; Notable for the following components:      Result Value   Leukocytes,Ua TRACE (*)    All other components within normal limits  CBC WITH DIFFERENTIAL/PLATELET - Abnormal; Notable for the following components:   MCH 25.2 (*)    RDW 16.7 (*)    All other components within normal limits  COMPREHENSIVE METABOLIC PANEL - Abnormal; Notable for the following components:   AST 60 (*)    ALT 71 (*)    All other components within normal limits    EKG None  Radiology CT Renal Stone Study  Result Date: 09/26/2019 CLINICAL DATA:  44 year old male with history of right flank pain intermittently for the past 2 months. EXAM: CT ABDOMEN AND PELVIS WITHOUT CONTRAST TECHNIQUE: Multidetector CT imaging of the abdomen and pelvis was performed following the standard protocol without IV contrast. COMPARISON:  No priors. FINDINGS: Lower  chest: 3 mm right lower lobe nodule (axial image 20 of series 4). Ground-glass attenuation and interlobular septal thickening throughout the visualize lung bases, concerning for a background of mild interstitial pulmonary edema. Hepatobiliary: Diffuse low attenuation throughout the hepatic parenchyma, indicative of hepatic steatosis. No discrete cystic or solid hepatic lesions are confidently identified on today's noncontrast CT examination. Partially calcified gallstones lying dependently in the gallbladder. No findings to suggest an acute cholecystitis at this time. Pancreas: No definite pancreatic mass or peripancreatic fluid collections or inflammatory changes are noted on today's noncontrast CT examination. Spleen: Unremarkable. Adrenals/Urinary Tract: No calcifications are identified within the collecting system of either kidney, along the course of either ureter, or  within the lumen of the urinary bladder. No hydroureteronephrosis. 1.9 cm low-attenuation lesion in the upper pole of the right kidney, incompletely characterized on today's noncontrast CT examination, but statistically likely to represent a cyst. Unenhanced appearance of the left kidney and bilateral adrenal glands is unremarkable. Unenhanced appearance of the urinary bladder is normal. Stomach/Bowel: Unenhanced appearance of the stomach is normal. No pathologic dilatation of small bowel or colon. Numerous colonic diverticulae are noted, particularly in the sigmoid colon, without surrounding inflammatory changes to suggest an acute diverticulitis at this time. Normal appendix. Vascular/Lymphatic: No atherosclerotic calcifications noted in the abdominal aorta or pelvic vasculature. No lymphadenopathy noted in the abdomen or pelvis. Reproductive: Prostate gland and seminal vesicles are unremarkable in appearance. Other: No significant volume of ascites. No pneumoperitoneum. Small periumbilical ventral hernia to the left of the umbilicus containing  only a small amount of omental fat. Musculoskeletal: There are no aggressive appearing lytic or blastic lesions noted in the visualized portions of the skeleton. IMPRESSION: 1. No acute findings in the abdomen or pelvis to account for the patient's symptoms. Specifically, no urinary tract calculi no findings of urinary tract obstruction. 2. Cholelithiasis without evidence of acute cholecystitis at this time. 3. Visualized portions of the lung bases are concerning for interstitial pulmonary edema. Further clinical evaluation is recommended. 4. 3 mm subpleural nodule in the posterior aspect of the right lower lobe, nonspecific, but statistically likely a benign subpleural lymph node. No follow-up needed if patient is low-risk. Non-contrast chest CT can be considered in 12 months if patient is high-risk. This recommendation follows the consensus statement: Guidelines for Management of Incidental Pulmonary Nodules Detected on CT Images: From the Fleischner Society 2017; Radiology 2017; 284:228-243. 5. Severe hepatic steatosis. 6. Colonic diverticulosis without evidence of acute diverticulitis at this time. 7. Small periumbilical ventral hernia containing only omental fat. No associated bowel incarceration or obstruction at this time. Electronically Signed   By: Trudie Reed M.D.   On: 09/26/2019 16:13    Procedures Procedures (including critical care time)  Medications Ordered in ED Medications - No data to display  ED Course  I have reviewed the triage vital signs and the nursing notes.  Pertinent labs & imaging results that were available during my care of the patient were reviewed by me and considered in my medical decision making (see chart for details).    MDM Rules/Calculators/A&P                      Intermittent flank pain of unclear etiology but with multiple findings on CT scan including benign appearing right renal cyst, also with severe fatty liver and gallstones but no evidence of  acute cholecystitis.  He does have a mild bump in his LFTs, but has a normal WBC count.  He is pain-free at this time and has been all day today.  CT findings were discussed with patient.  Given the gallstone finding, although he has no pain with palpation in his right upper quadrant, negative Murphy sign, referral given to general surgery to further determine whether these episodes are due to the stones.  He was prescribed a small quantity of pain medication in case he has another episode.  Referral to Dr. Henreitta Leber.  Also discussed pleural nodule. Pt is a nonsmoker,low risk.  Final Clinical Impression(s) / ED Diagnoses Final diagnoses:  Flank pain  Gallstones  Hepatic steatosis    Rx / DC Orders ED Discharge Orders  Ordered    HYDROcodone-acetaminophen (NORCO/VICODIN) 5-325 MG tablet  Every 4 hours PRN,   Status:  Discontinued     09/26/19 1707    HYDROcodone-acetaminophen (NORCO/VICODIN) 5-325 MG tablet  Every 4 hours PRN     09/26/19 1708           Evalee Jefferson, PA-C 09/26/19 1752    Fredia Sorrow, MD 10/09/19 [phone]

## 2019-09-26 NOTE — ED Triage Notes (Signed)
Right flank pain for about 2 months, on and off.  Friday and yesterday pain was severe.  Pain was sharp and spasms.  Denies any injury.

## 2019-09-28 ENCOUNTER — Ambulatory Visit (INDEPENDENT_AMBULATORY_CARE_PROVIDER_SITE_OTHER): Payer: Managed Care, Other (non HMO) | Admitting: General Surgery

## 2019-09-28 ENCOUNTER — Other Ambulatory Visit: Payer: Self-pay

## 2019-09-28 ENCOUNTER — Encounter: Payer: Self-pay | Admitting: General Surgery

## 2019-09-28 ENCOUNTER — Encounter: Payer: Self-pay | Admitting: Family Medicine

## 2019-09-28 VITALS — BP 156/95 | HR 78 | Temp 97.9°F | Resp 20 | Ht 74.0 in | Wt 381.0 lb

## 2019-09-28 DIAGNOSIS — K8020 Calculus of gallbladder without cholecystitis without obstruction: Secondary | ICD-10-CM

## 2019-09-28 NOTE — Patient Instructions (Signed)

## 2019-09-28 NOTE — Progress Notes (Signed)
Cole Singleton; [id number]; 13-Jun-1975   HPI Patient is a 44 year old white male who was referred to my care by the emergency room for evaluation treatment of cholelithiasis.  He was seen in the emergency room on 09/26/2019.  He was complaining of right upper back pain and underwent a CT scan of the abdomen.  They found cholelithiasis.  No kidney stones were found.  Patient states the pain is made worse with movement.  He does a significant amount of heavy lifting at work.  He denies any nausea, vomiting, fatty food intolerance, fever, chills, or jaundice.  He was concerned that the cholelithiasis could be causing his pain.  He currently has 0 out of 10 abdominal pain.  He is followed by cardiology due to significant cardiomyopathy. Past Medical History:  Diagnosis Date  . Cardiomyopathy (HCC)   . Cardiomyopathy (HCC)   . CHF (congestive heart failure) (HCC)   . Hypertension   . Pneumonia     Past Surgical History:  Procedure Laterality Date  . NO PAST SURGERIES      Family History  Problem Relation Age of Onset  . Hypertension Mother   . Hypertension Father     Current Outpatient Medications on File Prior to Visit  Medication Sig Dispense Refill  . aspirin EC 81 MG tablet Take 1 tablet (81 mg total) by mouth daily. 30 tablet 0  . carvedilol (COREG) 6.25 MG tablet TAKE 1 TABLET BY MOUTH TWICE DAILY WITH A MEAL 180 tablet 3  . furosemide (LASIX) 40 MG tablet Take 1 tablet (40 mg total) by mouth daily. 90 tablet 3  . HYDROcodone-acetaminophen (NORCO/VICODIN) 5-325 MG tablet Take 1 tablet by mouth every 4 (four) hours as needed for moderate pain. 15 tablet 0  . omeprazole (PRILOSEC OTC) 20 MG tablet Take 1 tablet (20 mg total) by mouth daily. 90 tablet 3  . potassium chloride SA (KLOR-CON) 20 MEQ tablet Take 1 tablet (20 mEq total) by mouth daily. 90 tablet 3  . sacubitril-valsartan (ENTRESTO) 49-51 MG Take 1 tablet by mouth 2 (two) times daily. 180 tablet 3   No current  facility-administered medications on file prior to visit.    Allergies  Allergen Reactions  . Codeine Other (See Comments)    Severe headache Pt reports being able to take percocet & vicodin    Social History   Substance and Sexual Activity  Alcohol Use Yes   Comment: occas    Social History   Tobacco Use  Smoking Status Never Smoker  Smokeless Tobacco Current User  . Types: Snuff    Review of Systems  Constitutional: Negative.   HENT: Negative.   Eyes: Negative.   Respiratory: Negative.   Cardiovascular: Negative.   Gastrointestinal: Positive for abdominal pain.  Genitourinary: Negative.   Musculoskeletal: Negative.   Skin: Negative.   Neurological: Negative.   Endo/Heme/Allergies: Negative.   Psychiatric/Behavioral: Negative.     Objective   Vitals:   09/28/19 1017  BP: (!) 156/95  Pulse: 78  Resp: 20  Temp: 97.9 F (36.6 C)  SpO2: 95%    Physical Exam Vitals reviewed.  Constitutional:      Appearance: He is well-developed. He is obese. He is not ill-appearing.  HENT:     Head: Normocephalic and atraumatic.  Eyes:     General: No scleral icterus. Cardiovascular:     Rate and Rhythm: Normal rate and regular rhythm.     Heart sounds: No murmur heard.  No friction rub. No gallop.  Pulmonary:     Effort: Pulmonary effort is normal. No respiratory distress.     Breath sounds: Normal breath sounds. No stridor. No wheezing, rhonchi or rales.  Chest:     Chest wall: No tenderness.  Abdominal:     General: Abdomen is protuberant. Bowel sounds are normal. There is no distension.     Palpations: Abdomen is soft. There is no mass.     Tenderness: There is no abdominal tenderness.     Hernia: No hernia is present.  Skin:    General: Skin is warm and dry.  Neurological:     Mental Status: He is alert and oriented to person, place, and time.   ER notes reviewed.  CT scan report reviewed.  Cardiology note reviewed.  Assessment  Right upper back  pain of unknown etiology.  Doubt this is related to his cholelithiasis.  He does not have any typical symptoms for biliary colic.  He has significant cardiomyopathy that would make him at high risk for any surgical intervention. Plan   Literature was given concerning the signs and symptoms of biliary colic.  No need for cholecystectomy at this time.  Patient understands.  Follow-up as needed.

## 2020-01-12 ENCOUNTER — Telehealth: Payer: Managed Care, Other (non HMO) | Admitting: Cardiovascular Disease

## 2020-01-16 DIAGNOSIS — I5022 Chronic systolic (congestive) heart failure: Secondary | ICD-10-CM | POA: Insufficient documentation

## 2020-01-16 DIAGNOSIS — Z7189 Other specified counseling: Secondary | ICD-10-CM | POA: Insufficient documentation

## 2020-01-16 DIAGNOSIS — I5042 Chronic combined systolic (congestive) and diastolic (congestive) heart failure: Secondary | ICD-10-CM | POA: Insufficient documentation

## 2020-01-16 NOTE — Progress Notes (Deleted)
Cardiology Office Note   Date:  01/16/2020   ID:  Farren Nelles, DOB 14-Nov-1975, MRN [id number]  PCP:  Patient, [name]  Cardiologist:   Prentice Docker, MD (Inactive)   No chief complaint on file.     History of Present Illness: Cole Singleton is a 44 y.o. male who presents for follow-up of chronic combined heart failure.  Echocardiogram 05/20/17 demonstrated severely reduced left ventricular systolic function with severe left ventricular dilatation and diffuse hypokinesis, LVEF was  20-25%, with restrictive diastolic dysfunction and elevated filling pressures. There is moderate right ventricular dilatation and mild mitral regurgitation.   Nuclear stress test on 06/01/17 showed no current myocardium at jeopardy with no evidence of significant ischemia. There was a moderate inferior wall defect which was consistent with possible prior infarct versus sub-diaphragmatic attenuation.  He was to have follow up with EP to discuss ICD but this never happened.  He was followed by Dr. Purvis Sheffield.  ***    ***  He still has not followed up with EP regarding ICD evaluation.  He denies chest pain, palpitations, leg swelling.  He seldom gets short of breath.  He has had to use an extra dose of Lasix on 3 occasions since his last visit with me in March 2020.  He has occasional problems with seasonal allergies.  He had questions about taking the COVID-19 vaccine.  I personally reviewed ECG performed today which demonstrates sinus rhythm with diffuse nonspecific ST segment and T wave abnormalities.    Social history: His father-in-law is Dimas Aguas Bullins whom I have taken care of. Profession involves building airplane tires.     Past Medical History:  Diagnosis Date  . Cardiomyopathy (HCC)   . Cardiomyopathy (HCC)   . CHF (congestive heart failure) (HCC)   . Hypertension   . Pneumonia     Past Surgical History:  Procedure Laterality Date  . NO PAST SURGERIES        Current Outpatient Medications  Medication Sig Dispense Refill  . aspirin EC 81 MG tablet Take 1 tablet (81 mg total) by mouth daily. 30 tablet 0  . carvedilol (COREG) 6.25 MG tablet TAKE 1 TABLET BY MOUTH TWICE DAILY WITH A MEAL 180 tablet 3  . furosemide (LASIX) 40 MG tablet Take 1 tablet (40 mg total) by mouth daily. 90 tablet 3  . HYDROcodone-acetaminophen (NORCO/VICODIN) 5-325 MG tablet Take 1 tablet by mouth every 4 (four) hours as needed for moderate pain. 15 tablet 0  . omeprazole (PRILOSEC OTC) 20 MG tablet Take 1 tablet (20 mg total) by mouth daily. 90 tablet 3  . potassium chloride SA (KLOR-CON) 20 MEQ tablet Take 1 tablet (20 mEq total) by mouth daily. 90 tablet 3  . sacubitril-valsartan (ENTRESTO) 49-51 MG Take 1 tablet by mouth 2 (two) times daily. 180 tablet 3   No current facility-administered medications for this visit.    Allergies:   Codeine    ROS:  Please see the history of present illness.   Otherwise, review of systems are positive for {NONE DEFAULTED:18576::"none"}.   All other systems are reviewed and negative.    PHYSICAL EXAM: VS:  There were no vitals taken for this visit. , BMI There is no height or weight on file to calculate BMI. GENERAL:  Well appearing NECK:  No jugular venous distention, waveform within normal limits, carotid upstroke brisk and symmetric, no bruits, no thyromegaly LUNGS:  Clear to auscultation bilaterally CHEST:  Unremarkable HEART:  PMI not displaced or  sustained,S1 and S2 within normal limits, no S3, no S4, no clicks, no rubs, *** murmurs ABD:  Flat, positive bowel sounds normal in frequency in pitch, no bruits, no rebound, no guarding, no midline pulsatile mass, no hepatomegaly, no splenomegaly EXT:  2 plus pulses throughout, no edema, no cyanosis no clubbing     *** GENERAL:  Well appearing HEENT:  Pupils equal round and reactive, fundi not visualized, oral mucosa unremarkable NECK:  No jugular venous distention,  waveform within normal limits, carotid upstroke brisk and symmetric, no bruits, no thyromegaly LYMPHATICS:  No cervical, inguinal adenopathy LUNGS:  Clear to auscultation bilaterally BACK:  No CVA tenderness CHEST:  Unremarkable HEART:  PMI not displaced or sustained,S1 and S2 within normal limits, no S3, no S4, no clicks, no rubs, *** murmurs ABD:  Flat, positive bowel sounds normal in frequency in pitch, no bruits, no rebound, no guarding, no midline pulsatile mass, no hepatomegaly, no splenomegaly EXT:  2 plus pulses throughout, no edema, no cyanosis no clubbing SKIN:  No rashes no nodules NEURO:  Cranial nerves II through XII grossly intact, motor grossly intact throughout PSYCH:  Cognitively intact, oriented to person place and time    EKG:  EKG {ACTION; IS/IS RSW:[id number]} ordered today. The ekg ordered today demonstrates ***   Recent Labs: 09/26/2019: ALT 71; BUN 11; Creatinine, Ser 0.95; Hemoglobin 14.3; Platelets 269; Potassium 3.5; Sodium 138    Lipid Panel No results found for: CHOL, TRIG, HDL, CHOLHDL, VLDL, LDLCALC, LDLDIRECT    Wt Readings from Last 3 Encounters:  09/28/19 (!) 381 lb (172.8 kg)  09/26/19 (!) 380 lb (172.4 kg)  07/11/19 (!) 387 lb 12.8 oz (175.9 kg)      Other studies Reviewed: Additional studies/ records that were reviewed today include: ***. Review of the above records demonstrates:  Please see elsewhere in the note.  ***   ASSESSMENT AND PLAN:  CHRONIC COMBINED SYSTOLIC AND DIASTOLIC HF:  ***  COVID EDUCATION:  ***    Current medicines are reviewed at length with the patient today.  The patient {ACTIONS; HAS/DOES NOT HAVE:19233} concerns regarding medicines.  The following changes have been made:  {PLAN; NO CHANGE:13088:s}  Labs/ tests ordered today include: *** No orders of the defined types were placed in this encounter.    Disposition:   FU with ***    Signed, Rollene Rotunda, MD  01/16/2020 8:31 PM    [facility]

## 2020-01-17 ENCOUNTER — Ambulatory Visit: Payer: Managed Care, Other (non HMO) | Admitting: Cardiology

## 2020-05-13 ENCOUNTER — Other Ambulatory Visit: Payer: Self-pay

## 2020-07-09 ENCOUNTER — Encounter (HOSPITAL_COMMUNITY): Payer: Self-pay | Admitting: Emergency Medicine

## 2020-07-09 ENCOUNTER — Emergency Department (HOSPITAL_COMMUNITY)
Admission: EM | Admit: 2020-07-09 | Discharge: 2020-07-10 | Disposition: A | Discharge status: Home / Self Care | Payer: BC Managed Care – PPO | Attending: Emergency Medicine | Admitting: Emergency Medicine

## 2020-07-09 ENCOUNTER — Other Ambulatory Visit: Payer: Self-pay

## 2020-07-09 DIAGNOSIS — I110 Hypertensive heart disease with heart failure: Secondary | ICD-10-CM | POA: Insufficient documentation

## 2020-07-09 DIAGNOSIS — I5042 Chronic combined systolic (congestive) and diastolic (congestive) heart failure: Secondary | ICD-10-CM | POA: Diagnosis not present

## 2020-07-09 DIAGNOSIS — R42 Dizziness and giddiness: Secondary | ICD-10-CM | POA: Diagnosis present

## 2020-07-09 DIAGNOSIS — E876 Hypokalemia: Secondary | ICD-10-CM

## 2020-07-09 DIAGNOSIS — R6883 Chills (without fever): Secondary | ICD-10-CM | POA: Insufficient documentation

## 2020-07-09 DIAGNOSIS — R112 Nausea with vomiting, unspecified: Secondary | ICD-10-CM | POA: Diagnosis not present

## 2020-07-09 DIAGNOSIS — E860 Dehydration: Secondary | ICD-10-CM

## 2020-07-09 DIAGNOSIS — N179 Acute kidney failure, unspecified: Secondary | ICD-10-CM

## 2020-07-09 DIAGNOSIS — Z7982 Long term (current) use of aspirin: Secondary | ICD-10-CM | POA: Diagnosis not present

## 2020-07-09 DIAGNOSIS — F17200 Nicotine dependence, unspecified, uncomplicated: Secondary | ICD-10-CM | POA: Insufficient documentation

## 2020-07-09 LAB — URINALYSIS, ROUTINE W REFLEX MICROSCOPIC
Bilirubin Urine: NEGATIVE
Glucose, UA: NEGATIVE mg/dL
Hgb urine dipstick: NEGATIVE
Ketones, ur: 5 mg/dL — AB
Leukocytes,Ua: NEGATIVE
Nitrite: NEGATIVE
Protein, ur: 100 mg/dL — AB
Specific Gravity, Urine: 1.025 (ref 1.005–1.030)
pH: 5 (ref 5.0–8.0)

## 2020-07-09 LAB — BASIC METABOLIC PANEL
Anion gap: 12 (ref 5–15)
BUN: 23 mg/dL — ABNORMAL HIGH (ref 6–20)
CO2: 23 mmol/L (ref 22–32)
Calcium: 8.3 mg/dL — ABNORMAL LOW (ref 8.9–10.3)
Chloride: 100 mmol/L (ref 98–111)
Creatinine, Ser: 1.49 mg/dL — ABNORMAL HIGH (ref 0.61–1.24)
GFR, Estimated: 59 mL/min — ABNORMAL LOW (ref >60)
Glucose, Bld: 107 mg/dL — ABNORMAL HIGH (ref 70–99)
Potassium: 2.8 mmol/L — ABNORMAL LOW (ref 3.5–5.1)
Sodium: 135 mmol/L (ref 135–145)

## 2020-07-09 LAB — CBC
HCT: 44.3 % (ref 39.0–52.0)
Hemoglobin: 14.5 g/dL (ref 13.0–17.0)
MCH: 25.6 pg — ABNORMAL LOW (ref 26.0–34.0)
MCHC: 32.7 g/dL (ref 30.0–36.0)
MCV: 78.3 fL — ABNORMAL LOW (ref 80.0–100.0)
Platelets: 281 10*3/uL (ref 150–400)
RBC: 5.66 MIL/uL (ref 4.22–5.81)
RDW: 17.4 % — ABNORMAL HIGH (ref 11.5–15.5)
WBC: 4.1 10*3/uL (ref 4.0–10.5)
nRBC: 0 % (ref 0.0–0.2)

## 2020-07-09 MED ORDER — LACTATED RINGERS IV BOLUS
1000.0000 mL | Freq: Once | INTRAVENOUS | Status: AC
  Administered 2020-07-09: 1000 mL via INTRAVENOUS

## 2020-07-09 MED ORDER — ONDANSETRON HCL 4 MG/2ML IJ SOLN
4.0000 mg | Freq: Once | INTRAMUSCULAR | Status: AC
  Administered 2020-07-09: 4 mg via INTRAVENOUS
  Filled 2020-07-09: qty 2

## 2020-07-09 MED ORDER — POTASSIUM CHLORIDE CRYS ER 20 MEQ PO TBCR
40.0000 meq | EXTENDED_RELEASE_TABLET | Freq: Once | ORAL | Status: AC
  Administered 2020-07-09: 40 meq via ORAL
  Filled 2020-07-09: qty 2

## 2020-07-09 MED ORDER — POTASSIUM CHLORIDE 10 MEQ/100ML IV SOLN
10.0000 meq | INTRAVENOUS | Status: AC
  Administered 2020-07-09 – 2020-07-10 (×3): 10 meq via INTRAVENOUS
  Filled 2020-07-09 (×3): qty 100

## 2020-07-09 NOTE — ED Triage Notes (Signed)
Pt to the ED after having a stomach virus and unable tom eat for several days.  Pt was at work and felt dizzy.

## 2020-07-09 NOTE — ED Provider Notes (Signed)
[facility] EMERGENCY DEPARTMENT Provider Note   CSN: [id number] Arrival date & time: 07/09/20  2015   History Chief Complaint  Patient presents with  . Dizziness    Cole Singleton is a 45 y.o. male.  The history is provided by the patient.  Dizziness He has history of hypertension, combined systolic and diastolic heart failure and comes in because of generalized weakness.  He had an illness manifested by vomiting and diarrhea which started 3 days ago and ended 2 days ago.  Since then, he continues to have nausea and has not been able to eat or drink much because he gets nauseated when he starts to drink or eat.  He was at work today when he started feeling generally weak and dizzy.  He has not had any fevers but he has had some chills and sweats.  He denies any abdominal pain.  His wife did have a similar illness the week before he got sick.  Past Medical History:  Diagnosis Date  . Cardiomyopathy (HCC)   . Cardiomyopathy (HCC)   . CHF (congestive heart failure) (HCC)   . Hypertension   . Pneumonia     Patient Active Problem List   Diagnosis Date Noted  . Educated about COVID-19 virus infection 01/16/2020  . 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 Acute respiratory failure with hypoxia (HCC) 10/11/2014  . Anemia 10/10/2014  . Hypokalemia   . Acute systolic heart failure (HCC) 10/09/2014  . Hypertension   . CAP (community acquired pneumonia) 10/07/2014  . Obesity (BMI 35.0-39.9 without comorbidity) 10/07/2014    Past Surgical History:  Procedure Laterality Date  . NO PAST SURGERIES         Family History  Problem Relation Age of Onset  . Hypertension Mother   . Hypertension Father     Social History   Tobacco Use  . Smoking status: Never Smoker  . Smokeless tobacco: Current User    Types: Snuff  Vaping Use  . Vaping Use: Never used  Substance Use Topics  . Alcohol use: Yes    Comment: occas  . Drug use: No     Home Medications Prior to Admission medications   Medication Sig Start Date End Date Taking? Authorizing Provider  aspirin EC 81 MG tablet Take 1 tablet (81 mg total) by mouth daily. 02/09/15   Eber Hong, MD  carvedilol (COREG) 6.25 MG tablet TAKE 1 TABLET BY MOUTH TWICE DAILY WITH A MEAL 07/11/19   Laqueta Linden, MD  furosemide (LASIX) 40 MG tablet Take 1 tablet (40 mg total) by mouth daily. 07/11/19   Laqueta Linden, MD  HYDROcodone-acetaminophen (NORCO/VICODIN) 5-325 MG tablet Take 1 tablet by mouth every 4 (four) hours as needed for moderate pain. 09/26/19   Burgess Amor, PA-C  omeprazole (PRILOSEC OTC) 20 MG tablet Take 1 tablet (20 mg total) by mouth daily. 07/11/19   Laqueta Linden, MD  potassium chloride SA (KLOR-CON) 20 MEQ tablet Take 1 tablet (20 mEq total) by mouth daily. 07/11/19   Laqueta Linden, MD  sacubitril-valsartan (ENTRESTO) 49-51 MG Take 1 tablet by mouth 2 (two) times daily. 07/11/19   Laqueta Linden, MD    Allergies    Codeine  Review of Systems   Review of Systems  Neurological: Positive for dizziness.  All other systems reviewed and are negative.   Physical Exam Updated Vital Signs BP (!) 126/97   Pulse 81   Temp 98 F (36.7  C) (Oral)   Resp (!) 23   Ht 6\' 2"  (1.88 m)   Wt (!) 172.4 kg   SpO2 97%   BMI 48.79 kg/m   Physical Exam Vitals and nursing note reviewed.   Morbidly obese 45 year old male, resting comfortably and in no acute distress. Vital signs are significant for elevated respiratory rate and blood pressure. Oxygen saturation is 97%, which is normal. Head is normocephalic and atraumatic. PERRLA, EOMI. Oropharynx is clear. Neck is nontender and supple without adenopathy or JVD. Back is nontender and there is no CVA tenderness. Lungs are clear without rales, wheezes, or rhonchi. Chest is nontender. Heart has regular rate and rhythm without murmur. Abdomen is soft, flat, nontender without masses or  hepatosplenomegaly and peristalsis is hypoactive. Extremities have trace edema, full range of motion is present. Skin is warm and dry without rash. Neurologic: Mental status is normal, cranial nerves are intact, there are no motor or sensory deficits.  ED Results / Procedures / Treatments   Labs (all labs ordered are listed, but only abnormal results are displayed) Labs Reviewed  BASIC METABOLIC PANEL - Abnormal; Notable for the following components:      Result Value   Potassium 2.8 (*)    Glucose, Bld 107 (*)    BUN 23 (*)    Creatinine, Ser 1.49 (*)    Calcium 8.3 (*)    GFR, Estimated 59 (*)    All other components within normal limits  CBC - Abnormal; Notable for the following components:   MCV 78.3 (*)    MCH 25.6 (*)    RDW 17.4 (*)    All other components within normal limits  URINALYSIS, ROUTINE W REFLEX MICROSCOPIC - Abnormal; Notable for the following components:   Color, Urine AMBER (*)    APPearance HAZY (*)    Ketones, ur 5 (*)    Protein, ur 100 (*)    Bacteria, UA RARE (*)    All other components within normal limits  MAGNESIUM   Procedures Procedures   Medications Ordered in ED Medications  ondansetron (ZOFRAN) injection 4 mg (has no administration in time range)  lactated ringers bolus 1,000 mL (has no administration in time range)  potassium chloride SA (KLOR-CON) CR tablet 40 mEq (has no administration in time range)  potassium chloride 10 mEq in 100 mL IVPB (has no administration in time range)    ED Course  I have reviewed the triage vital signs and the nursing notes.  Pertinent lab results that were available during my care of the patient were reviewed by me and considered in my medical decision making (see chart for details).  MDM Rules/Calculators/A&P Weakness following illness manifested by vomiting and diarrhea.  Clinical picture sounds like viral gastroenteritis.  Suspect he is somewhat dehydrated even though he still has some peripheral  edema.  Labs are consistent with dehydration.  Creatinine has increased from baseline of 1, today creatinine is 1.49.  He is also significantly hypokalemic with potassium 2.4.  He will be given IV fluids, IV and oral potassium.  He will be given ondansetron and will need to make sure that he is able to tolerate oral fluids.  Old records are reviewed, and he has no relevant past visits.  Following above-noted treatment, nausea is improved but he does not feel any stronger.  He has tolerated oral fluids.  He is felt to be safe for discharge.  He is advised to increase his potassium supplement to 3 times a  day for the next 3 days, given prescription for ondansetron for nausea.  He will need to follow-up with his primary care provider to repeat basic metabolic panel in 1 week to make sure his renal function is back to baseline and potassium is at an adequate level.  Final Clinical Impression(s) / ED Diagnoses Final diagnoses:  Dehydration  Hypokalemia  Acute kidney injury (nontraumatic) (HCC)    Rx / DC Orders ED Discharge Orders         Ordered    potassium chloride SA (KLOR-CON) 20 MEQ tablet  3 times daily        07/10/20 0227    ondansetron (ZOFRAN) 4 MG tablet  Every 6 hours PRN        07/10/20 0227           Dione Booze, MD 07/10/20 0230

## 2020-07-10 LAB — MAGNESIUM: Magnesium: 1.7 mg/dL (ref 1.7–2.4)

## 2020-07-10 MED ORDER — ONDANSETRON HCL 4 MG PO TABS
4.0000 mg | ORAL_TABLET | Freq: Four times a day (QID) | ORAL | 0 refills | Status: DC | PRN
Start: 2020-07-10 — End: 2020-08-13

## 2020-07-10 MED ORDER — POTASSIUM CHLORIDE CRYS ER 20 MEQ PO TBCR: 20.0000 meq | EXTENDED_RELEASE_TABLET | Freq: Three times a day (TID) | ORAL | 3 refills | Status: DC

## 2020-07-10 NOTE — Discharge Instructions (Signed)
Increase your potassium to 3 times a day for the next 3 days, then resume taking it once a day.  Make sure you are drinking enough fluid.

## 2020-08-12 NOTE — Progress Notes (Signed)
Cardiology Office Note  Date: 08/13/2020   ID: Cole Singleton, DOB 1975/10/27, MRN [id number]  PCP:  Patient, [name] (Inactive)  Cardiologist:  No primary care provider on file. Electrophysiologist:  None   Chief Complaint: Cardiac follow up / Out of medications  History of Present Illness: Cole Singleton is a 45 y.o. male with a history of chronic combined systolic and diastolic HF, HTN.  Last saw Dr Purvis Sheffield on 07/11/2019. Previous echocardiogram 2019 with EF 20-25% with restrictive diastolic dysfunction and elevated filling pressures. Moderate RV dilatation and Mild MR. He had not followed up with EP regarding ICD evaluation. Nuclear stress 2019. No evidence of ischemia. Moderate inferior wall defect c/w possible prior infarct vs sub diaphragmatic attenuation. He had no c/o CP, palpitations, LE edema. He had used Lasix on 3 occasions since prior visit in 2020.  He was continuing Carvedilol and Entresto 24/26 mg po bid.  BP was elevated. Entresto was increased to 49/51 mg po bid to help with BP control and cardiomyopathy.  He was continuing 40 mg po Lasix daily and potassium supplementation. He was encouraged to follow up with EP for AICD evaluation. Continue to monitor mild aneurysmal aortic dilation.   He is here today for 1 year follow-up.  He was running out of all of his medications and had been been rationing what he had left and was taking them once a day versus twice a day or skipping a day.  He denies any recent DOE or SOB.  Denies any significant weight gain.  In fact since last June he has lost about 10 pounds.  He states he is off disability now and is working at the [employer].  He denies any lower extremity edema.  Denies any activity intolerance.  He continues on Entresto 49/51 mg p.o. twice daily.  Carvedilol 6.25 mg p.o. twice daily.  Furosemide 40 mg daily.  With potassium supplementation.  Blood pressure is reasonably well controlled at 134/72.   Past  Medical History:  Diagnosis Date  . Cardiomyopathy (HCC)   . Cardiomyopathy (HCC)   . CHF (congestive heart failure) (HCC)   . Hypertension   . Pneumonia     Past Surgical History:  Procedure Laterality Date  . NO PAST SURGERIES      Current Outpatient Medications  Medication Sig Dispense Refill  . aspirin EC 81 MG tablet Take 1 tablet (81 mg total) by mouth daily. 30 tablet 0  . carvedilol (COREG) 6.25 MG tablet Take 1 tablet (6.25 mg total) by mouth 2 (two) times daily. 180 tablet 3  . furosemide (LASIX) 40 MG tablet Take 1 tablet (40 mg total) by mouth daily. 90 tablet 3  . omeprazole (PRILOSEC OTC) 20 MG tablet Take 1 tablet (20 mg total) by mouth daily. 90 tablet 3  . potassium chloride SA (KLOR-CON) 20 MEQ tablet Take 1 tablet (20 mEq total) by mouth daily. 90 tablet 3  . sacubitril-valsartan (ENTRESTO) 49-51 MG Take 1 tablet by mouth 2 (two) times daily. 180 tablet 3   No current facility-administered medications for this visit.   Allergies:  Codeine   Social History: The patient  reports that he has never smoked. His smokeless tobacco use includes snuff. He reports current alcohol use. He reports that he does not use drugs.   Family History: The patient's family history includes Hypertension in his father and mother.   ROS:  Please see the history of present illness. Otherwise, complete review of systems is  positive for none.  All other systems are reviewed and negative.   Physical Exam: VS:  BP 134/72   Pulse 64   Ht 6\' 2"  (1.88 m)   Wt (!) 371 lb (168.3 kg)   SpO2 98%   BMI 47.63 kg/m , BMI Body mass index is 47.63 kg/m.  Wt Readings from Last 3 Encounters:  08/13/20 (!) 371 lb (168.3 kg)  07/09/20 (!) 380 lb (172.4 kg)  09/28/19 (!) 381 lb (172.8 kg)    General: Morbidly obese patient appears comfortable at rest. Neck: Supple, no elevated JVP or carotid bruits, no thyromegaly. Lungs: Clear to auscultation, nonlabored breathing at rest. Cardiac: Regular  rate and rhythm, no S3 or significant systolic murmur, no pericardial rub. Extremities: No pitting edema, distal pulses 2+. Skin: Warm and dry. Musculoskeletal: No kyphosis. Neuropsychiatric: Alert and oriented x3, affect grossly appropriate.  ECG:  EKG 07/09/2020 sinus rhythm with marked sinus arrhythmia rate of 90.  ST and T wave abnormality consider inferior lateral ischemia  Recent Labwork: 09/26/2019: ALT 71; AST 60 07/09/2020: BUN 23; Creatinine, Ser 1.49; Hemoglobin 14.5; Magnesium 1.7; Platelets 281; Potassium 2.8; Sodium 135  No results found for: CHOL, TRIG, HDL, CHOLHDL, VLDL, LDLCALC, LDLDIRECT  Other Studies Reviewed Today:  Echocardiogram 05/20/2017 Study Conclusions   - Left ventricle: The cavity size was severely dilated. Wall  thickness was normal. Systolic function was severely reduced. The  estimated ejection fraction was in the range of 20% to 25%.  Diffuse hypokinesis. Doppler parameters are consistent with  restrictive physiology, indicative of decreased left ventricular  diastolic compliance and/or increased left atrial pressure.  Doppler parameters are consistent with high ventricular filling  pressure.  - Mitral valve: There was mild regurgitation.  - Right ventricle: The cavity size was moderately dilated. Wall  thickness was normal.    NST 06/01/2017 Study Result  Narrative & Impression   There was no ST segment deviation noted during stress.  This is a high risk study. Risk is based on decreased LVEF. There is no current myocardium at jeopardy.  The left ventricular ejection fraction is severely decreased (<30%).  Moderate inferior defect consistent with possible prior infarct vs subdiaphragmatic attenuation. There is no current ischemia.     Chest CT 04/28/2017 FINDINGS: Cardiovascular: Minimal aneurysmal dilatation of the ascending thoracic aorta 4.0 cm transverse image 50. No calcified plaque identified. No pericardial  effusion  Assessment and Plan:  1. Chronic combined systolic (congestive) and diastolic (congestive) heart failure (HCC)   2. Essential hypertension   3. Aortic dilatation (HCC)   4. Cardiomyopathy, unspecified type (HCC)    1. Chronic combined systolic (congestive) and diastolic (congestive) heart failure (HCC) History of chronic combined systolic heart failure with last echo 2019 demonstrating EF of 20 to 25%, diffuse hypokinesis.  Doppler parameters consistent with restrictive physiology indicative of decreased LV diastolic compliance and/or increased left atrial pressure.  Doppler parameters consistent with high ventricular filling pressure.  Mild mitral regurgitation He continues taking Entresto 49/50 mg p.o. twice daily.  Continuing carvedilol 6.25 mg p.o. twice daily.  Continuing Lasix 40 mg daily.  Please get a follow-up echocardiogram.  Please refill all of cardiac medications.  2. Essential hypertension Blood pressure is reasonably well controlled at 134/72 today.  Continue Entresto 49/51 mg p.o. twice daily.  Continue carvedilol 6.25 mg p.o. twice daily.  Continue Lasix 40 mg daily.  3. Aortic dilatation (HCC) Previous chest CT 2019 demonstrated minimal aneurysmal dilatation of the ascending thoracic aorta at 4.0 cm  transverse image.  No calcified plaque identified  Medication Adjustments/Labs and Tests Ordered: Current medicines are reviewed at length with the patient today.  Concerns regarding medicines are outlined above.   Disposition: Follow-up with Dr. Wyline Mood or APP 6 months  Signed, Rennis Harding, NP 08/13/2020 11:38 AM    [facility] at [facility] [address] [place], [place], [place] [address] Phone: [phone]; Fax: [phone]

## 2020-08-13 ENCOUNTER — Ambulatory Visit (INDEPENDENT_AMBULATORY_CARE_PROVIDER_SITE_OTHER): Payer: BC Managed Care – PPO | Admitting: Family Medicine

## 2020-08-13 ENCOUNTER — Encounter: Payer: Self-pay | Admitting: Family Medicine

## 2020-08-13 VITALS — BP 134/72 | HR 64 | Ht 74.0 in | Wt 371.0 lb

## 2020-08-13 DIAGNOSIS — I429 Cardiomyopathy, unspecified: Secondary | ICD-10-CM

## 2020-08-13 DIAGNOSIS — I10 Essential (primary) hypertension: Secondary | ICD-10-CM | POA: Diagnosis not present

## 2020-08-13 DIAGNOSIS — I5042 Chronic combined systolic (congestive) and diastolic (congestive) heart failure: Secondary | ICD-10-CM

## 2020-08-13 DIAGNOSIS — I77819 Aortic ectasia, unspecified site: Secondary | ICD-10-CM | POA: Diagnosis not present

## 2020-08-13 MED ORDER — CARVEDILOL 6.25 MG PO TABS: 6.2500 mg | ORAL_TABLET | Freq: Two times a day (BID) | ORAL | 3 refills | Status: DC

## 2020-08-13 MED ORDER — FUROSEMIDE 40 MG PO TABS: 40.0000 mg | ORAL_TABLET | Freq: Every day | ORAL | 3 refills | Status: DC

## 2020-08-13 MED ORDER — OMEPRAZOLE MAGNESIUM 20 MG PO TBEC: 20.0000 mg | DELAYED_RELEASE_TABLET | Freq: Every day | ORAL | 3 refills | Status: DC

## 2020-08-13 MED ORDER — POTASSIUM CHLORIDE CRYS ER 20 MEQ PO TBCR: 20.0000 meq | EXTENDED_RELEASE_TABLET | Freq: Every day | ORAL | 3 refills | Status: DC

## 2020-08-13 MED ORDER — SACUBITRIL-VALSARTAN 49-51 MG PO TABS: 1.0000 | ORAL_TABLET | Freq: Two times a day (BID) | ORAL | 3 refills | Status: DC

## 2020-08-13 NOTE — Patient Instructions (Signed)

## 2020-09-17 ENCOUNTER — Ambulatory Visit (HOSPITAL_COMMUNITY): Admission: RE | Admit: 2020-09-17 | Payer: BC Managed Care – PPO | Source: Ambulatory Visit

## 2020-09-25 ENCOUNTER — Encounter: Payer: Self-pay | Admitting: *Deleted

## 2021-08-25 ENCOUNTER — Telehealth: Payer: Self-pay | Admitting: Student

## 2021-08-25 NOTE — Telephone Encounter (Signed)
?*  STAT* If patient is at the pharmacy, call can be transferred to refill team. ? ? ?1. Which medications need to be refilled? (please list name of each medication and dose if known) sacubitril-valsartan (ENTRESTO) 49-51 MG ? ?2. Which pharmacy/location (including street and city if local pharmacy) is medication to be sent to? [place], [place] [address] ? ?3. Do they need a 30 day or 90 day supply? 30 day  ?

## 2021-08-26 MED ORDER — SACUBITRIL-VALSARTAN 49-51 MG PO TABS: 1.0000 | ORAL_TABLET | Freq: Two times a day (BID) | ORAL | 3 refills | Status: DC

## 2021-08-26 NOTE — Telephone Encounter (Signed)
Can provide refills. Will need a BMET at his follow-up visit.  ? ?Thanks,  ?[place]    ? ?

## 2021-08-26 NOTE — Telephone Encounter (Signed)
Entresto 49/51 mg refilled to [facility], added comment to office visit that pt needs bmet ?

## 2021-10-08 ENCOUNTER — Encounter: Payer: Self-pay | Admitting: Internal Medicine

## 2021-10-08 ENCOUNTER — Telehealth: Payer: Self-pay | Admitting: *Deleted

## 2021-10-08 ENCOUNTER — Ambulatory Visit: Payer: BC Managed Care – PPO | Admitting: Internal Medicine

## 2021-10-08 VITALS — BP 130/98 | HR 82 | Ht 74.0 in | Wt 365.2 lb

## 2021-10-08 DIAGNOSIS — I77819 Aortic ectasia, unspecified site: Secondary | ICD-10-CM

## 2021-10-08 DIAGNOSIS — I10 Essential (primary) hypertension: Secondary | ICD-10-CM | POA: Diagnosis not present

## 2021-10-08 DIAGNOSIS — Z9189 Other specified personal risk factors, not elsewhere classified: Secondary | ICD-10-CM

## 2021-10-08 DIAGNOSIS — I428 Other cardiomyopathies: Secondary | ICD-10-CM

## 2021-10-08 NOTE — Progress Notes (Signed)
OFFICE NOTE  Chief Complaint:  Follow-up heart failure  Primary Care Physician: Patient, [name]  HPI:  Cole Singleton is a 46 y.o. male with a past medial history significant for presumed nonischemic cardiomyopathy with LVEF less than 20% at onepoint,  however improved up to 25% in 2019.  At the time he underwent Myoview stress testing which was nonischemic, but did show a fixed inferior defect consistent with either scar or possible inferior attenuation artifact.  He has not been seen in follow-up since then.  He was supposed to have a repeat echo however it conflicted with another appointment his daughter needed to see her doctor and he was unable to get it done.  He reports only shortness of breath with marked exertion but generally can do his normal activities.  He works second shift at [employer] and is fairly active there.  He denies any chest pain.  Blood pressure was elevated today particular the diastolic blood pressure.  He reports pliant with his medications which include Entresto, furosemide and carvedilol.  He takes aspirin only as needed.  He also reported after questioning that he does snore and can catch himself gasping for breath.  He does feel some fatigue and nonrestorative sleep.  He answered the Epworth sleepiness scale questionnaire today which indicated a 10.  PMHx:  Past Medical History:  Diagnosis Date   Cardiomyopathy (HCC)    Cardiomyopathy (HCC)    CHF (congestive heart failure) (HCC)    Hypertension    Pneumonia     Past Surgical History:  Procedure Laterality Date   NO PAST SURGERIES      FAMHx:  Family History  Problem Relation Age of Onset   Hypertension Mother    Hypertension Father     SOCHx:   reports that he has never smoked. His smokeless tobacco use includes snuff. He reports current alcohol use. He reports that he does not use drugs.  ALLERGIES:  Allergies  Allergen Reactions   Codeine Other (See Comments)    Severe  headache Pt reports being able to take percocet & vicodin    ROS: Pertinent items noted in HPI and remainder of comprehensive ROS otherwise negative.  HOME MEDS: Current Outpatient Medications on File Prior to Visit  Medication Sig Dispense Refill   aspirin EC 81 MG tablet Take 1 tablet (81 mg total) by mouth daily. (Patient taking differently: Take 81 mg by mouth. occasionally) 30 tablet 0   carvedilol (COREG) 6.25 MG tablet Take 1 tablet (6.25 mg total) by mouth 2 (two) times daily. 180 tablet 3   furosemide (LASIX) 40 MG tablet Take 1 tablet (40 mg total) by mouth daily. 90 tablet 3   omeprazole (PRILOSEC OTC) 20 MG tablet Take 1 tablet (20 mg total) by mouth daily. 90 tablet 3   potassium chloride SA (KLOR-CON) 20 MEQ tablet Take 1 tablet (20 mEq total) by mouth daily. 90 tablet 3   sacubitril-valsartan (ENTRESTO) 49-51 MG Take 1 tablet by mouth 2 (two) times daily. 60 tablet 3   No current facility-administered medications on file prior to visit.    LABS/IMAGING: No results found for this or any previous visit (from the past 48 hour(s)). No results found.  LIPID PANEL: No results found for: "CHOL", "TRIG", "HDL", "CHOLHDL", "VLDL", "LDLCALC", "LDLDIRECT"   WEIGHTS: Wt Readings from Last 3 Encounters:  10/08/21 (!) 365 lb 3.2 oz (165.7 kg)  08/13/20 (!) 371 lb (168.3 kg)  07/09/20 (!) 380 lb (172.4 kg)  VITALS: BP (!) 130/98   Pulse 82   Ht 6\' 2"  (1.88 m)   Wt (!) 365 lb 3.2 oz (165.7 kg)   SpO2 94%   BMI 46.89 kg/m   EXAM: General appearance: alert, no distress, and morbidly obese Neck: no carotid bruit, no JVD, and thyroid not enlarged, symmetric, no tenderness/mass/nodules Lungs: clear to auscultation bilaterally Heart: regular rate and rhythm, S1, S2 normal, no murmur, click, rub or gallop Abdomen: soft, non-tender; bowel sounds normal; no masses,  no organomegaly Extremities: extremities normal, atraumatic, no cyanosis or edema Pulses: 2+ and  symmetric Skin: Skin color, texture, turgor normal. No rashes or lesions Neurologic: Grossly normal Psych: Pleasant  EKG: Normal sinus rhythm at 78, poor R wave progression anteriorly, inferior and lateral T wave inversions- personally reviewed  ASSESSMENT: Nonischemic cardiomyopathy, NYHA class I-II symptoms, LVEF 20 to 25% (2019) Low risk Myoview stress test in 2019 Hypertension Morbid obesity At risk for obstructive sleep apnea -EPWSS score of 10  PLAN: 1.   Mr. Mcpheeters has a nonischemic cardiomyopathy but has not had his EF reassessed since 2019.  He has been on adequate medical therapy with Entresto, carvedilol and a diuretic.  I like to get a repeat echo.  In addition he has concerning features for sleep apnea.  His sleepiness score was 10 and he is morbidly obese with a thick neck.  He also has had witnessed apnea and snores heavily.  I like to set up for a home sleep study.  Blood pressure was a little elevated today but in general he says is better controlled.  He is continue to work on exercise and weight loss.  I will contact him with the results of those studies and make adjustments as necessary.  Follow-up afterwards.  2020, MD, [facility], FACP  [place]  [facility]  Medical Director of the Advanced Lipid Disorders &  Cardiovascular Risk Reduction Clinic Diplomate of the American Board of Clinical Lipidology Attending Cardiologist  Direct Dial: [phone]  Fax: [phone]  Website:  [email or web address] Cole Singleton 10/08/2021, 1:29 PM

## 2021-10-08 NOTE — Patient Instructions (Addendum)
Medication Instructions:  Your physician recommends that you continue on your current medications as directed. Please refer to the Current Medication list given to you today.   Labwork: None  Testing/Procedures: Your physician has requested that you have an echocardiogram. Echocardiography is a painless test that uses sound waves to create images of your heart. It provides your doctor with information about the size and shape of your heart and how well your heart's chambers and valves are working. This procedure takes approximately one hour. There are no restrictions for this procedure.   Follow-Up: Follow up: PENDING  Any Other Special Instructions Will Be Listed Below (If Applicable).   Sleep Apnea Evaluation  [facility]  Today's Date: 10/08/2021   Patient Name: Cole Singleton        DOB: 1975-06-06       Height:  6\' 2"  (1.88 m)     Weight: (!) 365 lb 3.2 oz (165.7 kg)  BMI: Body mass index is 46.89 kg/m.    Referring Provider:  , MD   STOP-BANG RISK ASSESSMENT         If STOP-BANG Score ?3 OR two clinical symptoms - patient qualifies for WatchPAT (CPT 95800)      Sleep study ordered due to two (2) of the following clinical symptoms/diagnoses:  Excessive daytime sleepiness G47.10  Gastroesophageal reflux K21.9  Nocturia R35.1  Morning Headaches G44.221  Difficulty concentrating R41.840  Memory problems or poor judgment G31.84  Personality changes or irritability R45.4  Loud snoring R06.83  Depression F32.9  Unrefreshed by sleep G47.8  Impotence N52.9  History of high blood pressure R03.0  Insomnia G47.00  Sleep Disordered Breathing or Sleep Apnea ICD G47.33    Epworth Score: 10  If you need a refill on your cardiac medications before your next appointment, please call your pharmacy.

## 2021-10-08 NOTE — Telephone Encounter (Addendum)
Prior Authorization for [facility] sent to [facility] via Phone. Reference #   HST TYPE 3 SUGGESTED [facility]) APPROVED -AUTH# [id number]        10/08/21--12/06/21

## 2021-10-13 ENCOUNTER — Other Ambulatory Visit: Payer: Self-pay | Admitting: *Deleted

## 2021-10-13 MED ORDER — CARVEDILOL 6.25 MG PO TABS: 6.2500 mg | ORAL_TABLET | Freq: Two times a day (BID) | ORAL | 3 refills | Status: DC

## 2021-10-20 ENCOUNTER — Ambulatory Visit (HOSPITAL_COMMUNITY)
Admission: RE | Admit: 2021-10-20 | Discharge: 2021-10-20 | Disposition: A | Discharge status: Home / Self Care | Payer: BC Managed Care – PPO | Source: Ambulatory Visit | Attending: Internal Medicine | Admitting: Internal Medicine

## 2021-10-20 DIAGNOSIS — I428 Other cardiomyopathies: Secondary | ICD-10-CM | POA: Insufficient documentation

## 2021-10-20 LAB — ECHOCARDIOGRAM COMPLETE
Area-P 1/2: 6.17 cm2
Calc EF: 20 %
S' Lateral: 6.7 cm
Single Plane A2C EF: 20.6 %
Single Plane A4C EF: 19.8 %

## 2021-10-22 ENCOUNTER — Other Ambulatory Visit: Payer: Self-pay | Admitting: *Deleted

## 2021-10-22 DIAGNOSIS — I428 Other cardiomyopathies: Secondary | ICD-10-CM

## 2021-10-22 DIAGNOSIS — Z79899 Other long term (current) drug therapy: Secondary | ICD-10-CM

## 2021-10-22 MED ORDER — EMPAGLIFLOZIN 10 MG PO TABS: 10.0000 mg | ORAL_TABLET | Freq: Every day | ORAL | 3 refills | Status: DC

## 2021-10-23 NOTE — Telephone Encounter (Signed)
Left message to return call 

## 2021-10-23 NOTE — Telephone Encounter (Signed)
Pt was set up with itamar study in [place]. Will send notes to [facility] office

## 2021-10-24 ENCOUNTER — Telehealth: Payer: Self-pay

## 2021-10-24 NOTE — Telephone Encounter (Addendum)
**Note De-identified Bea Duren Obfuscation** -----  **Note De-Identified Marquez Ceesay Obfuscation** Message from Lindell Spar, RN sent at 10/22/2021  3:12 PM EDT ----- Regarding: PA for jardiance 10mg   This patient needs a PA for jardiance 10mg  daily  Non-ischemic cardiomyopathy   Thanks

## 2021-10-24 NOTE — Telephone Encounter (Signed)
Spoke with patient and notified him of med approval, that coupon was mailed w/co-pay card and lab order.   He would like to be contacted at a later date to schedule 3 month visit (as advised by Dr. Rennis Golden)   He also said he is approved for home sleep study, itamar. Will send to sleep coordinator to assist with this

## 2021-10-24 NOTE — Telephone Encounter (Signed)
**Note De-Identified Terianne Thaker Obfuscation** Reginia Naas Key: [id number] - PA Case ID: [id number] Outcome: Approved today Coverage Starts on: 10/24/2021 12:00:00 AM, Coverage Ends on: 10/24/2022 12:00:00 AM. Drug: [place] Pepper 10MG  tablets Form: PA Form (2017 NCPDP)  I have notified [facility] - [place], [place] - [address] (Ph: [phone]) of this approval.

## 2021-10-24 NOTE — Telephone Encounter (Signed)
Left a message for patient to call office regarding [facility] Study -pin # and instructions.

## 2021-10-27 NOTE — Telephone Encounter (Signed)
Left a message for patient to call office regarding [facility] Sleep Study -pin # and instructions.  Letter mailed

## 2021-10-29 NOTE — Telephone Encounter (Signed)
Sleep study is waiting to be scheduled by the sleep lab 10/08/21--12/06/21.

## 2021-11-03 ENCOUNTER — Telehealth: Payer: Self-pay | Admitting: Internal Medicine

## 2021-11-03 NOTE — Telephone Encounter (Signed)
Called patient and LVM 3x to schedule f/u in September or October with Randall An, PA per staff message from Herb Grays, [place].

## 2021-11-17 ENCOUNTER — Encounter (INDEPENDENT_AMBULATORY_CARE_PROVIDER_SITE_OTHER): Payer: BC Managed Care – PPO | Admitting: Cardiology

## 2021-11-17 DIAGNOSIS — G4733 Obstructive sleep apnea (adult) (pediatric): Secondary | ICD-10-CM

## 2021-11-22 NOTE — Procedures (Signed)
   SLEEP STUDY REPORT Patient Information Study Date: 11/17/21 Patient Name: Cole Singleton Patient ID: [id number] Birth Date: Age:46 Gender: Male BMI: 49.6 (W=366 lb, H=6' 0'') Referring Physician:Kenneth Hilty, MD  TEST DESCRIPTION: Home sleep apnea testing was completed using the WatchPat, a Type 1 device, utilizing peripheral arterial tonometry (PAT), chest movement, actigraphy, pulse oximetry, pulse rate, body position and snore. AHI was calculated with apnea and hypopnea using valid sleep time as the denominator. RDI includes apneas, hypopneas, and RERAs. The data acquired and the scoring of sleep and all associated events were performed in accordance with the recommended standards and specifications as outlined in the AASM Manual for the Scoring of Sleep and Associated Events 2.2.0 (2015).  FINDINGS: 1. Severe Obstructive Sleep Apnea with AHI 57.2/hr. 2. No significant Central Sleep Apnea with pAHIc 5.1/hr. 3. Oxygen desaturations as low as 67%. 4. Severe snoring was present. O2 sats were < 88% for 92.2 min. 5. Total sleep time was 6 hrs and 8 min. 6. 22.6% of total sleep time was spent in REM sleep. 7. Normal sleep onset latency at 18 min. 8. Normal REM sleep onset latency at 71 min. 9. Total awakenings were 7.  DIAGNOSIS: Severe Obstructive Sleep Apnea (G47.33) Nocturnal Hypoxemia  RECOMMENDATIONS: 1. Clinical correlation of these findings is necessary. The decision to treat obstructive sleep apnea (OSA) is usually based on the presence of apnea symptoms or the presence of associated medical conditions such as Hypertension, Congestive Heart Failure, Atrial Fibrillation or Obesity. The most common symptoms of OSA are snoring, gasping for breath while sleeping, daytime sleepiness and fatigue. 2. Initiating apnea therapy is recommended given the presence of symptoms and/or associated conditions. Recommend proceeding with one of the following:   a. Auto-CPAP therapy  with a pressure range of 5-20cm H2O.   b. An oral appliance (OA) that can be obtained from certain dentists with expertise in sleep medicine. These are primarily of use in non-obese patients with mild and moderate disease.   c. An ENT consultation which may be useful to look for specific causes of obstruction and possible treatment options.   d. If patient is intolerant to PAP therapy, consider referral to ENT for evaluation for hypoglossal nerve stimulator.  3. Close follow-up is necessary to ensure success with CPAP or oral appliance therapy for maximum benefit .  4. A follow-up oximetry study on CPAP is recommended to assess the adequacy of therapy and determine the need for supplemental oxygen or the potential need for Bi-level therapy. An arterial blood gas to determine the adequacy of baseline ventilation and oxygenation should also be considered.  5. Healthy sleep recommendations include: adequate nightly sleep (normal 7-9 hrs/night), avoidance of caffeine after noon and alcohol near bedtime, and maintaining a sleep environment that is cool, dark and quiet.  6. Weight loss for overweight patients is recommended. Even modest amounts of weight loss can significantly improve the severity of sleep apnea.  7. Snoring recommendations include: weight loss where appropriate, side sleeping, and avoidance of alcohol before bed.  8. Operation of motor vehicle should be avoided when sleepy.  Signature: Armanda Magic, MD; [facility]; Diplomat, American Board of Sleep Medicine Electronically Signed: 11/23/21

## 2021-11-23 ENCOUNTER — Ambulatory Visit: Payer: BC Managed Care – PPO

## 2021-11-23 DIAGNOSIS — Z9189 Other specified personal risk factors, not elsewhere classified: Secondary | ICD-10-CM

## 2021-12-23 ENCOUNTER — Other Ambulatory Visit: Payer: Self-pay | Admitting: Student

## 2022-01-02 ENCOUNTER — Other Ambulatory Visit (HOSPITAL_COMMUNITY)
Admission: RE | Admit: 2022-01-02 | Discharge: 2022-01-02 | Disposition: A | Discharge status: Home / Self Care | Payer: BC Managed Care – PPO | Source: Ambulatory Visit | Attending: Internal Medicine | Admitting: Internal Medicine

## 2022-01-02 DIAGNOSIS — I428 Other cardiomyopathies: Secondary | ICD-10-CM | POA: Insufficient documentation

## 2022-01-02 LAB — BASIC METABOLIC PANEL
Anion gap: 7 (ref 5–15)
BUN: 12 mg/dL (ref 6–20)
CO2: 24 mmol/L (ref 22–32)
Calcium: 9.2 mg/dL (ref 8.9–10.3)
Chloride: 107 mmol/L (ref 98–111)
Creatinine, Ser: 1.14 mg/dL (ref 0.61–1.24)
GFR, Estimated: >60 mL/min (ref >60)
Glucose, Bld: 128 mg/dL — ABNORMAL HIGH (ref 70–99)
Potassium: 3.9 mmol/L (ref 3.5–5.1)
Sodium: 138 mmol/L (ref 135–145)

## 2022-01-07 ENCOUNTER — Encounter: Payer: Self-pay | Admitting: Internal Medicine

## 2022-01-15 ENCOUNTER — Telehealth: Payer: Self-pay | Admitting: Internal Medicine

## 2022-01-15 NOTE — Telephone Encounter (Signed)
Called pt to let him know I will reach out to Dr. Debara Pickett and his nurse to see about his [place] paperwork. Pt states it needed to be turned in yesterday but was extended to be turned on by the beginning of next week.

## 2022-01-15 NOTE — Telephone Encounter (Signed)
Patient was seen by Dr. Debara Pickett in the [place] office. There has been no communication to date about paperwork that needs completion and nothing sent to Dr. Debara Pickett via fax or inter-office mail if this was dropped off at [place] office. There is also no phone note in epic denoting the date patient dropped off paperwork  Left message for patient to call back to discuss.

## 2022-01-15 NOTE — Telephone Encounter (Signed)
Pt calling to f/u on FMLA paperwork that was dropped off. Pt states that paperwork was supposed to be turned in by yesterday 9/27. Pt would like a callback regarding this matter. Please advise

## 2022-01-16 ENCOUNTER — Telehealth: Payer: Self-pay | Admitting: Internal Medicine

## 2022-01-16 NOTE — Telephone Encounter (Signed)
   Pt is calling to f/u. He said its been 2 weeks and he he needs to submit it as soon as possible

## 2022-01-16 NOTE — Telephone Encounter (Signed)
Communicated to [facility] team about need for follow up. MD is aware of situation and has been sent paperwork for completion.

## 2022-01-16 NOTE — Telephone Encounter (Signed)
Forms received on 01/02/2022 uploaded to chart and faxed to [facility]. $29.00 received AA

## 2022-01-19 DIAGNOSIS — Z0279 Encounter for issue of other medical certificate: Secondary | ICD-10-CM

## 2022-01-19 NOTE — Telephone Encounter (Signed)
FMLA paperwork completed - given to Annabell Sabal on 01/19/2022  Pixie Casino, MD, [facility], [place] Director of the Advanced Lipid Disorders &  Cardiovascular Risk Reduction Clinic Diplomate of the American Board of Clinical Lipidology Attending Cardiologist  Direct Dial: [phone]  Fax: [phone]  Website:  [email or web address]

## 2022-01-19 NOTE — Telephone Encounter (Signed)
01/19/2022 Received papers from Annabell Sabal . Faxed to [phone].  Completed forms are being faxed into the chart.

## 2022-01-27 NOTE — Progress Notes (Unsigned)
Cardiology Office Note    Date:  01/28/2022   ID:  Cole Singleton, DOB 1975/12/08, MRN [id number]  PCP:  Patient, [name]  Cardiologist: Pixie Casino, MD --> Requests to follow-up in [facility] Complaint  Patient presents with   Follow-up    3 month visit    History of Present Illness:    Cole Singleton is a 46 y.o. male with past medical history of HFrEF/presumed NICM (diagnosed with a cardiomyopathy 14+ years ago per his report, EF 20-25% in 2016 and 04/2017 with NST in 05/2017 showing possible prior infarct or attenuation but no ischemia) and HTN who presents to the office today for 4-month follow-up.   He was last examined by Dr. Debara Pickett in 09/2021 and reported having dyspnea on exertion but denied any associated chest pain or palpitations. A follow-up echocardiogram was recommended for reassessment of his EF along with a sleep study. Follow-up echocardiogram showed his EF remained reduced at 20 to 25% with global hypokinesis. He was started on Jardiance 10 mg daily while being continued on Coreg 6.25 mg twice daily, Lasix 40 mg daily and Entresto 49-51mg  BID.   In talking with the patient today, he reports overall feeling stable since his last office visit. He did notice an improvement in his abdominal distention after starting Jardiance and feels like things have stabilized since. He only takes Lasix as needed and does not have to utilize this routinely. Takes K-dur daily. He has intermittent dyspnea but denies any specific orthopnea or PND. No exertional chest pain or palpitations. He did undergo a sleep study in 10/2021 which showed severe OSA but has not proceeded with arranging a CPAP. He is unsure if he would be able to tolerate this.   Past Medical History:  Diagnosis Date   Cardiomyopathy ([place])    Cardiomyopathy ([place])    CHF (congestive heart failure) ([place])    Hypertension    Pneumonia     Past Surgical History:  Procedure Laterality Date   NO  PAST SURGERIES      Current Medications: Outpatient Medications Prior to Visit  Medication Sig Dispense Refill   carvedilol (COREG) 6.25 MG tablet Take 1 tablet (6.25 mg total) by mouth 2 (two) times daily. 180 tablet 3   empagliflozin (JARDIANCE) 10 MG TABS tablet Take 1 tablet (10 mg total) by mouth daily before breakfast. 90 tablet 3   furosemide (LASIX) 40 MG tablet Take 1 tablet (40 mg total) by mouth daily. (Patient taking differently: Take 40 mg by mouth daily as needed.) 90 tablet 3   sacubitril-valsartan (ENTRESTO) 49-51 MG Take 1 tablet by mouth twice daily 60 tablet 6   potassium chloride SA (KLOR-CON) 20 MEQ tablet Take 1 tablet (20 mEq total) by mouth daily. 90 tablet 3   albuterol (VENTOLIN HFA) 108 (90 Base) MCG/ACT inhaler SMARTSIG:2 Puff(s) By Mouth Every 6 Hours PRN     omeprazole (PRILOSEC OTC) 20 MG tablet Take 1 tablet (20 mg total) by mouth daily. (Patient not taking: Reported on 01/28/2022) 90 tablet 3   aspirin EC 81 MG tablet Take 1 tablet (81 mg total) by mouth daily. (Patient not taking: Reported on 01/28/2022) 30 tablet 0   No facility-administered medications prior to visit.     Allergies:   Codeine   Social History   Socioeconomic History   Marital status: Married    Spouse name: Not on file   Number of children: Not on file   Years of education:  Not on file   Highest education level: Not on file  Occupational History   Not on file  Tobacco Use   Smoking status: Never   Smokeless tobacco: Current    Types: Snuff  Vaping Use   Vaping Use: Never used  Substance and Sexual Activity   Alcohol use: Yes    Comment: occas   Drug use: No   Sexual activity: Not on file  Other Topics Concern   Not on file  Social History Narrative   Not on file   Social Determinants of Health   Financial Resource Strain: Not on file  Food Insecurity: Not on file  Transportation Needs: Not on file  Physical Activity: Not on file  Stress: Not on file  Social  Connections: Not on file     Family History:  The patient's family history includes Hypertension in his father and mother.   Review of Systems:    Please see the history of present illness.     All other systems reviewed and are otherwise negative except as noted above.   Physical Exam:    VS:  BP 132/80   Pulse 73   Ht 6\' 2"  (1.88 m)   Wt (!) 361 lb (163.7 kg)   SpO2 93%   BMI 46.35 kg/m    General: Pleasant, obese male appearing in no acute distress. Head: Normocephalic, atraumatic. Neck: No carotid bruits. JVD difficult to assess secondary to body habitus.  Lungs: Respirations regular and unlabored, without wheezes or rales.  Heart: Regular rate and rhythm. No S3 or S4.  No murmur, no rubs, or gallops appreciated. Abdomen: Appears non-distended. No obvious abdominal masses. Msk:  Strength and tone appear normal for age. No obvious joint deformities or effusions. Extremities: No clubbing or cyanosis. No pitting edema.  Distal pedal pulses are 2+ bilaterally. Neuro: Alert and oriented X 3. Moves all extremities spontaneously. No focal deficits noted. Psych:  Responds to questions appropriately with a normal affect. Skin: No rashes or lesions noted  Wt Readings from Last 3 Encounters:  01/28/22 (!) 361 lb (163.7 kg)  10/08/21 (!) 365 lb 3.2 oz (165.7 kg)  08/13/20 (!) 371 lb (168.3 kg)     Studies/Labs Reviewed:   EKG:  EKG is ordered today.  The ekg ordered today demonstrates normal sinus rhythm, heart rate 73 with diffuse ST abnormalities along the inferior and lateral leads which are overall similar to prior tracings.  Recent Labs: 01/02/2022: BUN 12; Creatinine, Ser 1.14; Potassium 3.9; Sodium 138   Lipid Panel No results found for: "CHOL", "TRIG", "HDL", "CHOLHDL", "VLDL", "LDLCALC", "LDLDIRECT"  Additional studies/ records that were reviewed today include:   NST: 06/01/2017 There was no ST segment deviation noted during stress. This is a high risk study. Risk  is based on decreased LVEF. There is no current myocardium at jeopardy. The left ventricular ejection fraction is severely decreased (<30%). Moderate inferior defect consistent with possible prior infarct vs subdiaphragmatic attenuation. There is no current ischemia.  Echocardiogram: 10/2021 IMPRESSIONS     1. Left ventricular ejection fraction, by estimation, is 20 to 25%. The  left ventricle has severely decreased function. The left ventricle  demonstrates global hypokinesis. The left ventricular internal cavity size  was severely dilated. Left ventricular  diastolic parameters were normal.   2. Right ventricular systolic function is normal. The right ventricular  size is normal.   3. Left atrial size was moderately dilated.   4. The mitral valve is normal in structure. Trivial  mitral valve  regurgitation. No evidence of mitral stenosis.   5. The aortic valve is tricuspid. Aortic valve regurgitation is not  visualized. No aortic stenosis is present.   6. The inferior vena cava is dilated in size with >50% respiratory  variability, suggesting right atrial pressure of 8 mmHg.   Assessment:    1. Chronic combined systolic (congestive) and diastolic (congestive) heart failure ([place])   2. Essential hypertension   3. Medication management   4. OSA (obstructive sleep apnea)      Plan:   In order of problems listed above:  1. HFrEF/Presumed NICM - He has a longstanding cardiomyopathy dating back 14+ years ago and presumed to be nonischemic in the setting of prior stress test showing no ischemia. His EF was at 25% by most recent imaging which is overall similar to prior studies. By review of notes, he has overall preferred conservative management given no recent anginal symptoms. - As discussed in his prior echocardiogram report, we did review EP referral for consideration of ICD placement but he is not interested in ICD placement at this time. Reports this was also reviewed in the  past. I encouraged him to make [place] aware if he changes his mind and wishes to proceed with this as we can arrange for EP follow-up. - In regard to medical therapy, he is currently on Coreg 6.25 mg twice daily, Jardiance 10 mg daily, Entresto 49-51 mg twice daily and PRN Lasix. I did recommend that we start Spironolactone 12.5 mg daily and that he should reduce K-dur to 10 mEq daily. Will recheck a BMET in 2 weeks. Co-pay card provided for [facility] as he reports this has been costing him almost $100 per month.   2. HTN - His blood pressure is well-controlled at 132/80 during today's visit. Continue Coreg and Entresto at current dosing with plans to add Spironolactone as outlined above.  3. OSA - Prior sleep study in 10/2021 showed severe OSA and he did not hear back in regards to the next steps for this. He is unsure if he would be able to tolerate a CPAP. I will send a note to our Sleep Dept today and ask them to follow-up with the patient.    Medication Adjustments/Labs and Tests Ordered: Current medicines are reviewed at length with the patient today.  Concerns regarding medicines are outlined above.  Medication changes, Labs and Tests ordered today are listed in the Patient Instructions below. Patient Instructions  Medication Instructions:   Decrease Potassium to 10 meq Daily  Start Spironolactone 12.5 mg Daily   *If you need a refill on your cardiac medications before your next appointment, please call your pharmacy*   Lab Work: Your physician recommends that you return for lab work in: 2 Weeks ( BMET)   If you have labs (blood work) drawn today and your tests are completely normal, you will receive your results only by: [employer] (if you have MyChart) OR A paper copy in the mail If you have any lab test that is abnormal or we need to change your treatment, we will call you to review the results.   Testing/Procedures: NONE    Follow-Up: At [facility], you and  your health needs are our priority.  As part of our continuing mission to provide you with exceptional heart care, we have created designated Provider Care Teams.  These Care Teams include your primary Cardiologist (physician) and Advanced Practice Providers (APPs -  Physician Assistants and Nurse Practitioners)  who all work together to provide you with the care you need, when you need it.  We recommend signing up for the patient portal called "MyChart".  Sign up information is provided on this After Visit Summary.  MyChart is used to connect with patients for Virtual Visits (Telemedicine).  Patients are able to view lab/test results, encounter notes, upcoming appointments, etc.  Non-urgent messages can be sent to your provider as well.   To learn more about what you can do with MyChart, go to [email or web address].    Your next appointment:   3 month(s)  The format for your next appointment:   In Person  Provider:   Bernerd Pho, PA-C, Ermalinda Barrios, PA-C, or Dr. Dellia Cloud      Other Instructions Thank you for choosing [facility]!    Important Information About Sugar         Signed, Erma Heritage, PA-C  01/28/2022 9:46 PM    [place] [address] [place],  [address] Phone: [phone] Fax: [phone]

## 2022-01-28 ENCOUNTER — Ambulatory Visit: Payer: BC Managed Care – PPO | Attending: Student | Admitting: Student

## 2022-01-28 ENCOUNTER — Encounter: Payer: Self-pay | Admitting: Student

## 2022-01-28 ENCOUNTER — Encounter: Payer: Self-pay | Admitting: *Deleted

## 2022-01-28 VITALS — BP 132/80 | HR 73 | Ht 74.0 in | Wt 361.0 lb

## 2022-01-28 DIAGNOSIS — I5042 Chronic combined systolic (congestive) and diastolic (congestive) heart failure: Secondary | ICD-10-CM | POA: Diagnosis not present

## 2022-01-28 DIAGNOSIS — Z79899 Other long term (current) drug therapy: Secondary | ICD-10-CM

## 2022-01-28 DIAGNOSIS — G4733 Obstructive sleep apnea (adult) (pediatric): Secondary | ICD-10-CM

## 2022-01-28 DIAGNOSIS — I10 Essential (primary) hypertension: Secondary | ICD-10-CM | POA: Diagnosis not present

## 2022-01-28 MED ORDER — POTASSIUM CHLORIDE ER 10 MEQ PO TBCR: 10.0000 meq | EXTENDED_RELEASE_TABLET | Freq: Every day | ORAL | 3 refills | Status: DC

## 2022-01-28 MED ORDER — SPIRONOLACTONE 25 MG PO TABS: 12.5000 mg | ORAL_TABLET | Freq: Every day | ORAL | 3 refills | Status: DC

## 2022-01-28 NOTE — Patient Instructions (Signed)
Medication Instructions:   Decrease Potassium to 10 meq Daily  Start Spironolactone 12.5 mg Daily   *If you need a refill on your cardiac medications before your next appointment, please call your pharmacy*   Lab Work: Your physician recommends that you return for lab work in: 2 Weeks ( BMET)   If you have labs (blood work) drawn today and your tests are completely normal, you will receive your results only by: [employer] (if you have MyChart) OR A paper copy in the mail If you have any lab test that is abnormal or we need to change your treatment, we will call you to review the results.   Testing/Procedures: NONE    Follow-Up: At [facility], you and your health needs are our priority.  As part of our continuing mission to provide you with exceptional heart care, we have created designated Provider Care Teams.  These Care Teams include your primary Cardiologist (physician) and Advanced Practice Providers (APPs -  Physician Assistants and Nurse Practitioners) who all work together to provide you with the care you need, when you need it.  We recommend signing up for the patient portal called "MyChart".  Sign up information is provided on this After Visit Summary.  MyChart is used to connect with patients for Virtual Visits (Telemedicine).  Patients are able to view lab/test results, encounter notes, upcoming appointments, etc.  Non-urgent messages can be sent to your provider as well.   To learn more about what you can do with MyChart, go to [email or web address].    Your next appointment:   3 month(s)  The format for your next appointment:   In Person  Provider:   Bernerd Pho, PA-C, Ermalinda Barrios, PA-C, or Dr. Dellia Cloud      Other Instructions Thank you for choosing [facility]!    Important Information About Sugar

## 2022-02-17 ENCOUNTER — Telehealth: Payer: Self-pay | Admitting: *Deleted

## 2022-02-17 NOTE — Telephone Encounter (Signed)
Left detailed message on voicemail and informed patient to call back. 

## 2022-05-06 ENCOUNTER — Ambulatory Visit: Payer: BC Managed Care – PPO | Admitting: Medical

## 2022-06-17 NOTE — Progress Notes (Deleted)
Cardiology Office Note:    Date:  06/17/2022   ID:  Cole Singleton, DOB 05/09/1975, MRN [id number]  PCP:  Patient, [name]  [facility] Providers Cardiologist:  Pixie Casino, MD { Click to update primary MD,subspecialty MD or APP then REFRESH:1}  *** Referring MD: No ref. provider found   Chief Complaint:  No chief complaint on file. {Click here for Visit Info    :1}    History of Present Illness:   Cole Singleton is a 47 y.o. male with  past medical history of HFrEF/presumed NICM (diagnosed with a cardiomyopathy 14+ years ago per his report, EF 20-25% in 2016 and 04/2017 with NST in 05/2017 showing possible prior infarct or attenuation but no ischemia) and HTN.   Follow-up echocardiogram  10/2021 showed his EF remained reduced at 20 to 25% with global hypokinesis. He was started on Jardiance 10 mg daily while being continued on Coreg 6.25 mg twice daily, Lasix 40 mg daily and Entresto 49-'51mg'$  BID. sleep study in 10/2021 which showed severe OSA.  Patient last saw Ms. Strader 01/2022 and spironolactone started.     Past Medical History:  Diagnosis Date   Cardiomyopathy ([place])    Cardiomyopathy ([place])    CHF (congestive heart failure) ([place])    Hypertension    Pneumonia    Current Medications: No outpatient medications have been marked as taking for the 06/29/22 encounter (Appointment) with Imogene Burn, PA-C.    Allergies:   Codeine   Social History   Tobacco Use   Smoking status: Never   Smokeless tobacco: Current    Types: Snuff  Vaping Use   Vaping Use: Never used  Substance Use Topics   Alcohol use: Yes    Comment: occas   Drug use: No    Family Hx: The patient's family history includes Hypertension in his father and mother.  ROS     Physical Exam:    VS:  There were no vitals taken for this visit.    Wt Readings from Last 3 Encounters:  01/28/22 (!) 361 lb (163.7 kg)  10/08/21 (!) 365 lb 3.2 oz (165.7 kg)  08/13/20 (!) 371 lb  (168.3 kg)    Physical Exam  GEN: Well nourished, well developed, in no acute distress  HEENT: normal  Neck: no JVD, carotid bruits, or masses Cardiac:RRR; no murmurs, rubs, or gallops  Respiratory:  clear to auscultation bilaterally, normal work of breathing GI: soft, nontender, nondistended, + BS Ext: without cyanosis, clubbing, or edema, Good distal pulses bilaterally MS: no deformity or atrophy  Skin: warm and dry, no rash Neuro:  Alert and Oriented x 3, Strength and sensation are intact Psych: euthymic mood, full affect        EKGs/Labs/Other Test Reviewed:    EKG:  EKG is *** ordered today.  The ekg ordered today demonstrates ***  Recent Labs: 01/02/2022: BUN 12; Creatinine, Ser 1.14; Potassium 3.9; Sodium 138   Recent Lipid Panel No results for input(s): "CHOL", "TRIG", "HDL", "VLDL", "LDLCALC", "LDLDIRECT" in the last 8760 hours.   Prior CV Studies: {Select studies to display:26339}  NST: 06/01/2017 There was no ST segment deviation noted during stress. This is a high risk study. Risk is based on decreased LVEF. There is no current myocardium at jeopardy. The left ventricular ejection fraction is severely decreased (<30%). Moderate inferior defect consistent with possible prior infarct vs subdiaphragmatic attenuation. There is no current ischemia.   Echocardiogram: 10/2021 IMPRESSIONS     1.  Left ventricular ejection fraction, by estimation, is 20 to 25%. The  left ventricle has severely decreased function. The left ventricle  demonstrates global hypokinesis. The left ventricular internal cavity size  was severely dilated. Left ventricular  diastolic parameters were normal.   2. Right ventricular systolic function is normal. The right ventricular  size is normal.   3. Left atrial size was moderately dilated.   4. The mitral valve is normal in structure. Trivial mitral valve  regurgitation. No evidence of mitral stenosis.   5. The aortic valve is tricuspid.  Aortic valve regurgitation is not  visualized. No aortic stenosis is present.   6. The inferior vena cava is dilated in size with >50% respiratory  variability, suggesting right atrial pressure of 8 mmHg.    Risk Assessment/Calculations/Metrics:   {Does this patient have ATRIAL FIBRILLATION?:[id number]} STOP-Bang Score:  5  { Consider Dx Sleep Disordered Breathing or Sleep Apnea  ICD G47.33          :1}    No BP recorded.  {Refresh Note OR Click here to enter BP  :1}***    ASSESSMENT & PLAN:   No problem-specific Assessment & Plan notes found for this encounter.   HFrEF/Presumed NICM - He has a longstanding cardiomyopathy dating back 14+ years ago and presumed to be nonischemic in the setting of prior stress test showing no ischemia. His EF was at 25% he has declined ICD placement  -currently on Coreg 6.25 mg twice daily, Jardiance 10 mg daily, Entresto 49-51 mg twice daily and PRN Lasix.Spironolactone 12.5 mg daily and that he should reduce K-dur to 10 mEq daily.     HTN - His blood pressure is well-controlled.   OSA - Prior sleep study in 10/2021 showed severe OSA and he did not hear back in regards to the next steps for this. He is unsure if he would be able to tolerate a CPAP. I will send a note to our Sleep Dept today and ask them to follow-up with the patient.            {Are you ordering a CV Procedure (e.g. stress test, cath, DCCV, TEE, etc)?   Press F2        :[id number]   Dispo:  No follow-ups on file.   Medication Adjustments/Labs and Tests Ordered: Current medicines are reviewed at length with the patient today.  Concerns regarding medicines are outlined above.  Tests Ordered: No orders of the defined types were placed in this encounter.  Medication Changes: No orders of the defined types were placed in this encounter.  Sumner Boast, PA-C  06/17/2022 7:30 AM    [facility] [place], [place], [place]  [address] Phone: [phone]; Fax:  [phone]

## 2022-06-29 ENCOUNTER — Ambulatory Visit: Payer: BC Managed Care – PPO | Attending: Medical | Admitting: Physician Assistant

## 2022-07-03 ENCOUNTER — Encounter: Payer: Self-pay | Admitting: Physician Assistant

## 2022-07-30 ENCOUNTER — Other Ambulatory Visit: Payer: Self-pay | Admitting: Student

## 2022-10-29 ENCOUNTER — Other Ambulatory Visit: Payer: Self-pay | Admitting: Internal Medicine

## 2022-11-25 ENCOUNTER — Other Ambulatory Visit: Payer: Self-pay | Admitting: Internal Medicine

## 2022-11-26 ENCOUNTER — Ambulatory Visit: Payer: BC Managed Care – PPO | Admitting: Nurse Practitioner

## 2022-11-26 NOTE — Progress Notes (Deleted)
  Cardiology Office Note:  .   Date:  11/26/2022  ID:  Cole Singleton, DOB 30-Sep-1975, MRN [id number] PCP: Patient, [name]  [facility] Providers Cardiologist:  Chrystie Nose, MD { Click to update primary MD,subspecialty MD or APP then REFRESH:1}   History of Present Illness: .   Cole Singleton is a 47 y.o. male with a PMH of HFrEF, presumed NICM, HTN, OSA, and morbid obesity, who presents today for 3 month follow-up.   First diagnosed with cardiomyopathy over 14 years ago per his report. EF in 2016 was 25%, NST in 2019 showed possible prior infarct/attention but negative for ischemia. N  Last seen by Randall An, PA-C on January 28, 2022.  He was overall doing well at the time, but did note intermittent dyspnea, but denied any CP, palpitations, orthopnea/PND.  He had not arrange CPAP for his OSA.  Was not interested in ICD placement, started on Aldactone 12.5 mg daily and K+ reduced to 10 mEq daily.   Today he presents for scheduled follow-up.  He states    ROS: ***  Studies Reviewed: .        *** Risk Assessment/Calculations:   {Does this patient have ATRIAL FIBRILLATION?[phone]} No BP recorded.  {Refresh Note OR Click here to enter BP  :1}***   STOP-Bang Score:     { Consider Dx Sleep Disordered Breathing or Sleep Apnea  ICD G47.33          :1}    Physical Exam:   VS:  There were no vitals taken for this visit.   Wt Readings from Last 3 Encounters:  01/28/22 (!) 361 lb (163.7 kg)  10/08/21 (!) 365 lb 3.2 oz (165.7 kg)  08/13/20 (!) 371 lb (168.3 kg)    GEN: Well nourished, well developed in no acute distress NECK: No JVD; No carotid bruits CARDIAC: ***RRR, no murmurs, rubs, gallops RESPIRATORY:  Clear to auscultation without rales, wheezing or rhonchi  ABDOMEN: Soft, non-tender, non-distended EXTREMITIES:  No edema; No deformity   ASSESSMENT AND PLAN: .   ***    {Are you ordering a CV Procedure (e.g. stress test, cath, DCCV, TEE, etc)?    Press F2        :[id number]}  Dispo: ***  Signed, Sharlene Dory, NP

## 2022-12-26 ENCOUNTER — Other Ambulatory Visit: Payer: Self-pay | Admitting: Internal Medicine

## 2023-01-27 ENCOUNTER — Telehealth: Payer: Self-pay | Admitting: Internal Medicine

## 2023-01-27 MED ORDER — ENTRESTO 49-51 MG PO TABS: 1.0000 | ORAL_TABLET | Freq: Two times a day (BID) | ORAL | 0 refills | Status: DC

## 2023-01-27 NOTE — Telephone Encounter (Signed)
Patient calling the office for samples of medication:   1.  What medication and dosage are you requesting samples for? sacubitril-valsartan (ENTRESTO) 49-51 MG   2.  Are you currently out of this medication? Yes   Patient recently just lost his job and insurance. Want to know if there is any relief or problems that can help with cost of medication

## 2023-01-27 NOTE — Telephone Encounter (Signed)
Spoke to Cole Singleton, samples are upfront and they are aware he needs to make a follow up appointment in a year.

## 2023-02-25 ENCOUNTER — Other Ambulatory Visit: Payer: Self-pay | Admitting: Internal Medicine

## 2023-03-17 ENCOUNTER — Encounter: Payer: Self-pay | Admitting: Nurse Practitioner

## 2023-03-17 ENCOUNTER — Ambulatory Visit: Payer: Medicaid Other | Attending: Nurse Practitioner | Admitting: Nurse Practitioner

## 2023-03-17 VITALS — BP 130/80 | HR 85 | Ht 74.0 in | Wt 372.0 lb

## 2023-03-17 DIAGNOSIS — Z13220 Encounter for screening for lipoid disorders: Secondary | ICD-10-CM

## 2023-03-17 DIAGNOSIS — I10 Essential (primary) hypertension: Secondary | ICD-10-CM | POA: Diagnosis not present

## 2023-03-17 DIAGNOSIS — Z758 Other problems related to medical facilities and other health care: Secondary | ICD-10-CM

## 2023-03-17 DIAGNOSIS — Z79899 Other long term (current) drug therapy: Secondary | ICD-10-CM | POA: Diagnosis not present

## 2023-03-17 DIAGNOSIS — I5020 Unspecified systolic (congestive) heart failure: Secondary | ICD-10-CM

## 2023-03-17 DIAGNOSIS — I5042 Chronic combined systolic (congestive) and diastolic (congestive) heart failure: Secondary | ICD-10-CM

## 2023-03-17 DIAGNOSIS — I159 Secondary hypertension, unspecified: Secondary | ICD-10-CM

## 2023-03-17 DIAGNOSIS — Z131 Encounter for screening for diabetes mellitus: Secondary | ICD-10-CM

## 2023-03-17 MED ORDER — EMPAGLIFLOZIN 10 MG PO TABS: 10.0000 mg | ORAL_TABLET | Freq: Every day | ORAL | 5 refills | Status: DC

## 2023-03-17 MED ORDER — ENTRESTO 49-51 MG PO TABS: 1.0000 | ORAL_TABLET | Freq: Two times a day (BID) | ORAL | 2 refills | Status: DC

## 2023-03-17 MED ORDER — CARVEDILOL 6.25 MG PO TABS: 6.2500 mg | ORAL_TABLET | Freq: Two times a day (BID) | ORAL | 5 refills | Status: DC

## 2023-03-17 MED ORDER — FUROSEMIDE 40 MG PO TABS: 40.0000 mg | ORAL_TABLET | ORAL | 5 refills | Status: DC | PRN

## 2023-03-17 MED ORDER — POTASSIUM CHLORIDE ER 10 MEQ PO TBCR: 10.0000 meq | EXTENDED_RELEASE_TABLET | Freq: Every day | ORAL | 2 refills | Status: DC

## 2023-03-17 NOTE — Progress Notes (Signed)
Cardiology Office Note:  .   Date:  03/17/2023 ID:  Cole Singleton, DOB 1976-02-22, MRN [id number] PCP: Patient, [name]  [facility] Providers Cardiologist:  Chrystie Nose, MD    History of Present Illness: .   Cole Singleton is a 47 y.o. male with a PMH of HFrEF/presumed NICM, hypertension, morbid obesity, OSA, who presents today for overdue follow-up.  Last seen by Randall An, PA-C on January 28, 2022.  Was over doing well at the time, did note some intermittent dyspnea, denied any PND/orthopnea, as well as chest pain/palpitations.  It was noted he had a previous sleep study performed in July 2023 that revealed severe OSA, had not proceeded with arranging a CPAP and patient noted he was unsure if he be able to tolerate CPAP.  Was not interested in ICD placement at the time.   Today he presents for overdue follow-up.  He states he recently changed jobs and had to [place], said he ran out of medications and had a spell of heart failure symptoms that have since resolved since returning to taking his medications.  Says he could not tolerate spironolactone as this made him feel lightheaded/"drained."  Overall he is doing well from a cardiac perspective, does admit to recent allergies/sinus issues.  He is due for lab work to be drawn, says he is looking for primary care provider.  ROS: Negative. See HPI.   Studies Reviewed: .    Echo 10/2021:  1. Left ventricular ejection fraction, by estimation, is 20 to 25%. The  left ventricle has severely decreased function. The left ventricle  demonstrates global hypokinesis. The left ventricular internal cavity size was severely dilated. Left ventricular diastolic parameters were normal.   2. Right ventricular systolic function is normal. The right ventricular  size is normal.   3. Left atrial size was moderately dilated.   4. The mitral valve is normal in structure. Trivial mitral valve  regurgitation. No evidence of  mitral stenosis.   5. The aortic valve is tricuspid. Aortic valve regurgitation is not  visualized. No aortic stenosis is present.   6. The inferior vena cava is dilated in size with >50% respiratory  variability, suggesting right atrial pressure of 8 mmHg.  Lexiscan 05/2017:  There was no ST segment deviation noted during stress. This is a high risk study. Risk is based on decreased LVEF. There is no current myocardium at jeopardy. The left ventricular ejection fraction is severely decreased (<30%). Moderate inferior defect consistent with possible prior infarct vs subdiaphragmatic attenuation. There is no current ischemia.  Physical Exam:   VS:  BP 130/80   Pulse 85   Ht 6\' 2"  (1.88 m)   Wt (!) 372 lb (168.7 kg)   SpO2 92%   BMI 47.76 kg/m    Wt Readings from Last 3 Encounters:  03/17/23 (!) 372 lb (168.7 kg)  01/28/22 (!) 361 lb (163.7 kg)  10/08/21 (!) 365 lb 3.2 oz (165.7 kg)    GEN: Morbidly obese, 47 y.o. male in no acute distress NECK: No JVD; No carotid bruits CARDIAC: S1/S2, RRR, no murmurs, rubs, gallops RESPIRATORY:  Clear to auscultation without rales, wheezing or rhonchi  ABDOMEN: Soft, non-tender, non-distended EXTREMITIES:  No edema; No deformity   ASSESSMENT AND PLAN: .    HFrEF, medication management Stage C, NYHA class I-II symptoms. EF 20-25% 10/2021. Has just restarted his HF medications after switching jobs. Euvolemic and well compensated on exam. Will provide refills of his cardiac medications  per his request. Continue Coreg, Jardiance, Lasix, potassium, and Entresto. Low sodium diet, fluid restriction <2L, and daily weights encouraged. Educated to contact our office for weight gain of 2 lbs overnight or 5 lbs in one week. Will obtain CBC and CMET. Will update Echo.   HTN SBP is mildly elevated today, home BPs are at goal. Discussed to monitor BP at home at least 2 hours after medications and sitting for 5-10 minutes.  Continue current medication regimen.   Providing refills for him as mentioned above.  Will obtain CMET and thyroid panel. Heart healthy diet and regular cardiovascular exercise encouraged.   Morbid obesity, screening for T2DM and HLD Weight loss via diet and exercise encouraged. Discussed the impact being overweight would have on cardiovascular risk.  Will obtain the following labs: CMET, FLP, A1c, and thyroid panel.  Does not have PCP He needs a PCP. Will provide referral to [facility]. He is agreeable to this.    Dispo: Follow-up with me/APP in 3 months or sooner if anything changes.   Signed, Sharlene Dory, NP

## 2023-03-17 NOTE — Patient Instructions (Addendum)
Medication Instructions:  Your physician recommends that you continue on your current medications as directed. Please refer to the Current Medication list given to you today.  Labwork: ASAP  Testing/Procedures: Your physician has requested that you have an echocardiogram. Echocardiography is a painless test that uses sound waves to create images of your heart. It provides your doctor with information about the size and shape of your heart and how well your heart's chambers and valves are working. This procedure takes approximately one hour. There are no restrictions for this procedure. Please do NOT wear cologne, perfume, aftershave, or lotions (deodorant is allowed). Please arrive 15 minutes prior to your appointment time.  Please note: We ask at that you not bring children with you during ultrasound (echo/ vascular) testing. Due to room size and safety concerns, children are not allowed in the ultrasound rooms during exams. Our front office staff cannot provide observation of children in our lobby area while testing is being conducted. An adult accompanying a patient to their appointment will only be allowed in the ultrasound room at the discretion of the ultrasound technician under special circumstances. We apologize for any inconvenience.  Follow-Up: Your physician recommends that you schedule a follow-up appointment in: 3 months   Any Other Special Instructions Will Be Listed Below (If Applicable).  If you need a refill on your cardiac medications before your next appointment, please call your pharmacy.

## 2023-03-25 LAB — COMPREHENSIVE METABOLIC PANEL
ALT: 24 [IU]/L (ref 0–44)
AST: 18 [IU]/L (ref 0–40)
Albumin: 4.1 g/dL (ref 4.1–5.1)
Alkaline Phosphatase: 93 [IU]/L (ref 44–121)
BUN/Creatinine Ratio: 11 (ref 9–20)
BUN: 12 mg/dL (ref 6–24)
Bilirubin Total: 0.6 mg/dL (ref 0.0–1.2)
CO2: 19 mmol/L — ABNORMAL LOW (ref 20–29)
Calcium: 9 mg/dL (ref 8.7–10.2)
Chloride: 104 mmol/L (ref 96–106)
Creatinine, Ser: 1.14 mg/dL (ref 0.76–1.27)
Globulin, Total: 2.6 g/dL (ref 1.5–4.5)
Glucose: 142 mg/dL — ABNORMAL HIGH (ref 70–99)
Potassium: 4.5 mmol/L (ref 3.5–5.2)
Sodium: 139 mmol/L (ref 134–144)
Total Protein: 6.7 g/dL (ref 6.0–8.5)
eGFR: 80 mL/min/{1.73_m2} (ref >59)

## 2023-03-25 LAB — CBC
Hematocrit: 47.7 % (ref 37.5–51.0)
Hemoglobin: 14.5 g/dL (ref 13.0–17.7)
MCH: 24 pg — ABNORMAL LOW (ref 26.6–33.0)
MCHC: 30.4 g/dL — ABNORMAL LOW (ref 31.5–35.7)
MCV: 79 fL (ref 79–97)
Platelets: 242 10*3/uL (ref 150–450)
RBC: 6.05 x10E6/uL — ABNORMAL HIGH (ref 4.14–5.80)
RDW: 16.2 % — ABNORMAL HIGH (ref 11.6–15.4)
WBC: 7.3 10*3/uL (ref 3.4–10.8)

## 2023-03-25 LAB — THYROID PANEL WITH TSH
Free Thyroxine Index: 1.6 (ref 1.2–4.9)
T3 Uptake Ratio: 23 % — ABNORMAL LOW (ref 24–39)
T4, Total: 7.1 ug/dL (ref 4.5–12.0)
TSH: 2.42 u[IU]/mL (ref 0.450–4.500)

## 2023-03-25 LAB — LIPID PANEL
Chol/HDL Ratio: 5.7 {ratio} — ABNORMAL HIGH (ref 0.0–5.0)
Cholesterol, Total: 223 mg/dL — ABNORMAL HIGH (ref 100–199)
HDL: 39 mg/dL — ABNORMAL LOW (ref >39)
LDL Chol Calc (NIH): 149 mg/dL — ABNORMAL HIGH (ref 0–99)
Triglycerides: 192 mg/dL — ABNORMAL HIGH (ref 0–149)
VLDL Cholesterol Cal: 35 mg/dL (ref 5–40)

## 2023-03-25 LAB — HEMOGLOBIN A1C
Est. average glucose Bld gHb Est-mCnc: 120 mg/dL
Hgb A1c MFr Bld: 5.8 % — ABNORMAL HIGH (ref 4.8–5.6)

## 2023-03-27 ENCOUNTER — Other Ambulatory Visit: Payer: Self-pay | Admitting: Student

## 2023-04-08 ENCOUNTER — Other Ambulatory Visit: Payer: Self-pay | Admitting: Nurse Practitioner

## 2023-04-08 DIAGNOSIS — E7800 Pure hypercholesterolemia, unspecified: Secondary | ICD-10-CM

## 2023-04-08 DIAGNOSIS — I5020 Unspecified systolic (congestive) heart failure: Secondary | ICD-10-CM

## 2023-04-08 DIAGNOSIS — I5021 Acute systolic (congestive) heart failure: Secondary | ICD-10-CM

## 2023-04-08 DIAGNOSIS — Z79899 Other long term (current) drug therapy: Secondary | ICD-10-CM

## 2023-04-08 DIAGNOSIS — I5042 Chronic combined systolic (congestive) and diastolic (congestive) heart failure: Secondary | ICD-10-CM

## 2023-04-08 MED ORDER — ROSUVASTATIN CALCIUM 10 MG PO TABS: 10.0000 mg | ORAL_TABLET | Freq: Every day | ORAL | 4 refills | Status: DC

## 2023-04-22 ENCOUNTER — Telehealth: Payer: Self-pay | Admitting: Pharmacy Technician

## 2023-04-22 ENCOUNTER — Other Ambulatory Visit (HOSPITAL_COMMUNITY): Payer: Self-pay

## 2023-04-22 NOTE — Telephone Encounter (Signed)
 Pharmacy Patient Advocate Encounter  Received notification from [employer] that Prior Authorization for Jardiance  10MG  tablets has been APPROVED from 04/22/23 to 04/21/24. Ran test claim, Copay is $4.00. This test claim was processed through [facility]- copay amounts may vary at other pharmacies due to pharmacy/plan contracts, or as the patient moves through the different stages of their insurance plan.   PA #/Case ID/Reference #:  [id number]

## 2023-04-22 NOTE — Telephone Encounter (Signed)
 Pharmacy Patient Advocate Encounter   Received notification from CoverMyMeds that prior authorization for Jardiance  10MG  tablets is required/requested.   Insurance verification completed.   The patient is insured through [facility] .   Per test claim: PA required; PA submitted to above mentioned insurance via CoverMyMeds Key/confirmation #/EOC [id number] Status is pending

## 2023-06-15 ENCOUNTER — Emergency Department (HOSPITAL_COMMUNITY): Payer: Medicaid Other

## 2023-06-15 ENCOUNTER — Encounter (HOSPITAL_COMMUNITY): Payer: Self-pay

## 2023-06-15 ENCOUNTER — Inpatient Hospital Stay (HOSPITAL_COMMUNITY)
Admission: EM | Admit: 2023-06-15 | Discharge: 2023-06-22 | DRG: 273 | Disposition: A | Discharge status: Home / Self Care | Payer: Medicaid Other | Attending: Internal Medicine | Admitting: Internal Medicine

## 2023-06-15 ENCOUNTER — Other Ambulatory Visit: Payer: Self-pay

## 2023-06-15 DIAGNOSIS — I480 Paroxysmal atrial fibrillation: Secondary | ICD-10-CM | POA: Insufficient documentation

## 2023-06-15 DIAGNOSIS — R7989 Other specified abnormal findings of blood chemistry: Secondary | ICD-10-CM | POA: Insufficient documentation

## 2023-06-15 DIAGNOSIS — D509 Iron deficiency anemia, unspecified: Secondary | ICD-10-CM | POA: Diagnosis present

## 2023-06-15 DIAGNOSIS — E871 Hypo-osmolality and hyponatremia: Secondary | ICD-10-CM | POA: Diagnosis present

## 2023-06-15 DIAGNOSIS — I4819 Other persistent atrial fibrillation: Secondary | ICD-10-CM | POA: Diagnosis present

## 2023-06-15 DIAGNOSIS — E876 Hypokalemia: Secondary | ICD-10-CM | POA: Diagnosis present

## 2023-06-15 DIAGNOSIS — I509 Heart failure, unspecified: Principal | ICD-10-CM

## 2023-06-15 DIAGNOSIS — N179 Acute kidney failure, unspecified: Secondary | ICD-10-CM | POA: Insufficient documentation

## 2023-06-15 DIAGNOSIS — Z7901 Long term (current) use of anticoagulants: Secondary | ICD-10-CM

## 2023-06-15 DIAGNOSIS — I429 Cardiomyopathy, unspecified: Secondary | ICD-10-CM

## 2023-06-15 DIAGNOSIS — G4733 Obstructive sleep apnea (adult) (pediatric): Secondary | ICD-10-CM | POA: Diagnosis present

## 2023-06-15 DIAGNOSIS — R7303 Prediabetes: Secondary | ICD-10-CM

## 2023-06-15 DIAGNOSIS — Z8701 Personal history of pneumonia (recurrent): Secondary | ICD-10-CM

## 2023-06-15 DIAGNOSIS — I110 Hypertensive heart disease with heart failure: Principal | ICD-10-CM | POA: Diagnosis present

## 2023-06-15 DIAGNOSIS — R918 Other nonspecific abnormal finding of lung field: Secondary | ICD-10-CM | POA: Diagnosis present

## 2023-06-15 DIAGNOSIS — E785 Hyperlipidemia, unspecified: Secondary | ICD-10-CM | POA: Diagnosis present

## 2023-06-15 DIAGNOSIS — K761 Chronic passive congestion of liver: Secondary | ICD-10-CM | POA: Diagnosis present

## 2023-06-15 DIAGNOSIS — R630 Anorexia: Secondary | ICD-10-CM | POA: Diagnosis present

## 2023-06-15 DIAGNOSIS — Z6841 Body Mass Index (BMI) 40.0 and over, adult: Secondary | ICD-10-CM

## 2023-06-15 DIAGNOSIS — K760 Fatty (change of) liver, not elsewhere classified: Secondary | ICD-10-CM | POA: Diagnosis present

## 2023-06-15 DIAGNOSIS — I5023 Acute on chronic systolic (congestive) heart failure: Secondary | ICD-10-CM | POA: Diagnosis present

## 2023-06-15 DIAGNOSIS — E1165 Type 2 diabetes mellitus with hyperglycemia: Secondary | ICD-10-CM | POA: Diagnosis present

## 2023-06-15 DIAGNOSIS — Z885 Allergy status to narcotic agent status: Secondary | ICD-10-CM

## 2023-06-15 DIAGNOSIS — Z7984 Long term (current) use of oral hypoglycemic drugs: Secondary | ICD-10-CM

## 2023-06-15 DIAGNOSIS — F17290 Nicotine dependence, other tobacco product, uncomplicated: Secondary | ICD-10-CM | POA: Diagnosis present

## 2023-06-15 DIAGNOSIS — Z7982 Long term (current) use of aspirin: Secondary | ICD-10-CM

## 2023-06-15 DIAGNOSIS — Z79899 Other long term (current) drug therapy: Secondary | ICD-10-CM

## 2023-06-15 DIAGNOSIS — Z8249 Family history of ischemic heart disease and other diseases of the circulatory system: Secondary | ICD-10-CM

## 2023-06-15 DIAGNOSIS — E66813 Obesity, class 3: Secondary | ICD-10-CM | POA: Insufficient documentation

## 2023-06-15 DIAGNOSIS — Z8619 Personal history of other infectious and parasitic diseases: Secondary | ICD-10-CM

## 2023-06-15 DIAGNOSIS — I4891 Unspecified atrial fibrillation: Secondary | ICD-10-CM

## 2023-06-15 DIAGNOSIS — I428 Other cardiomyopathies: Secondary | ICD-10-CM | POA: Diagnosis present

## 2023-06-15 DIAGNOSIS — I513 Intracardiac thrombosis, not elsewhere classified: Secondary | ICD-10-CM | POA: Diagnosis present

## 2023-06-15 DIAGNOSIS — I5082 Biventricular heart failure: Secondary | ICD-10-CM | POA: Diagnosis present

## 2023-06-15 DIAGNOSIS — R911 Solitary pulmonary nodule: Secondary | ICD-10-CM | POA: Diagnosis present

## 2023-06-15 DIAGNOSIS — I493 Ventricular premature depolarization: Secondary | ICD-10-CM | POA: Diagnosis present

## 2023-06-15 DIAGNOSIS — Z91199 Patient's noncompliance with other medical treatment and regimen due to unspecified reason: Secondary | ICD-10-CM

## 2023-06-15 DIAGNOSIS — R000 Tachycardia, unspecified: Secondary | ICD-10-CM | POA: Diagnosis present

## 2023-06-15 HISTORY — DX: Diverticulosis of intestine, part unspecified, without perforation or abscess without bleeding: K57.90

## 2023-06-15 HISTORY — DX: Chronic systolic (congestive) heart failure: I50.22

## 2023-06-15 HISTORY — DX: Solitary pulmonary nodule: R91.1

## 2023-06-15 HISTORY — DX: Obstructive sleep apnea (adult) (pediatric): G47.33

## 2023-06-15 HISTORY — DX: Calculus of gallbladder without cholecystitis without obstruction: K80.20

## 2023-06-15 HISTORY — DX: Morbid (severe) obesity due to excess calories: E66.01

## 2023-06-15 HISTORY — DX: Fatty (change of) liver, not elsewhere classified: K76.0

## 2023-06-15 LAB — CBC
HCT: 39.7 % (ref 39.0–52.0)
Hemoglobin: 12.5 g/dL — ABNORMAL LOW (ref 13.0–17.0)
MCH: 23.9 pg — ABNORMAL LOW (ref 26.0–34.0)
MCHC: 31.5 g/dL (ref 30.0–36.0)
MCV: 76.1 fL — ABNORMAL LOW (ref 80.0–100.0)
Platelets: 187 10*3/uL (ref 150–400)
RBC: 5.22 MIL/uL (ref 4.22–5.81)
RDW: 17.6 % — ABNORMAL HIGH (ref 11.5–15.5)
WBC: 8.1 10*3/uL (ref 4.0–10.5)
nRBC: 0 % (ref 0.0–0.2)

## 2023-06-15 LAB — TROPONIN I (HIGH SENSITIVITY)
Troponin I (High Sensitivity): 46 ng/L — ABNORMAL HIGH (ref <18)
Troponin I (High Sensitivity): 49 ng/L — ABNORMAL HIGH (ref <18)

## 2023-06-15 LAB — BASIC METABOLIC PANEL
Anion gap: 13 (ref 5–15)
BUN: 23 mg/dL — ABNORMAL HIGH (ref 6–20)
CO2: 23 mmol/L (ref 22–32)
Calcium: 9.2 mg/dL (ref 8.9–10.3)
Chloride: 102 mmol/L (ref 98–111)
Creatinine, Ser: 1.41 mg/dL — ABNORMAL HIGH (ref 0.61–1.24)
GFR, Estimated: >60 mL/min (ref >60)
Glucose, Bld: 104 mg/dL — ABNORMAL HIGH (ref 70–99)
Potassium: 3.3 mmol/L — ABNORMAL LOW (ref 3.5–5.1)
Sodium: 138 mmol/L (ref 135–145)

## 2023-06-15 NOTE — ED Triage Notes (Addendum)
 Pt arrives ambulatory to ED states that he was seen last week at [facility] with reports of congestion was placed on a antibiotic. Pt reports history of CHF and states that he feels bloated and short of breath. States that he has been taking his 'fluid pills but has not been peeing a lot'   Pt also reports not passing gas and feeling bloated states he did have a very small BM today.

## 2023-06-16 ENCOUNTER — Inpatient Hospital Stay (HOSPITAL_COMMUNITY): Payer: Medicaid Other

## 2023-06-16 ENCOUNTER — Other Ambulatory Visit (HOSPITAL_COMMUNITY): Payer: Self-pay | Admitting: *Deleted

## 2023-06-16 ENCOUNTER — Other Ambulatory Visit: Payer: Self-pay

## 2023-06-16 ENCOUNTER — Encounter (HOSPITAL_COMMUNITY): Payer: Self-pay | Admitting: Internal Medicine

## 2023-06-16 DIAGNOSIS — E1165 Type 2 diabetes mellitus with hyperglycemia: Secondary | ICD-10-CM | POA: Diagnosis present

## 2023-06-16 DIAGNOSIS — I110 Hypertensive heart disease with heart failure: Secondary | ICD-10-CM | POA: Diagnosis present

## 2023-06-16 DIAGNOSIS — K760 Fatty (change of) liver, not elsewhere classified: Secondary | ICD-10-CM | POA: Diagnosis present

## 2023-06-16 DIAGNOSIS — E876 Hypokalemia: Secondary | ICD-10-CM | POA: Diagnosis present

## 2023-06-16 DIAGNOSIS — I5021 Acute systolic (congestive) heart failure: Secondary | ICD-10-CM

## 2023-06-16 DIAGNOSIS — Z6841 Body Mass Index (BMI) 40.0 and over, adult: Secondary | ICD-10-CM | POA: Diagnosis not present

## 2023-06-16 DIAGNOSIS — E785 Hyperlipidemia, unspecified: Secondary | ICD-10-CM | POA: Diagnosis present

## 2023-06-16 DIAGNOSIS — I4819 Other persistent atrial fibrillation: Secondary | ICD-10-CM

## 2023-06-16 DIAGNOSIS — I509 Heart failure, unspecified: Secondary | ICD-10-CM

## 2023-06-16 DIAGNOSIS — K761 Chronic passive congestion of liver: Secondary | ICD-10-CM | POA: Diagnosis present

## 2023-06-16 DIAGNOSIS — I5020 Unspecified systolic (congestive) heart failure: Secondary | ICD-10-CM

## 2023-06-16 DIAGNOSIS — Z7901 Long term (current) use of anticoagulants: Secondary | ICD-10-CM | POA: Diagnosis not present

## 2023-06-16 DIAGNOSIS — I4891 Unspecified atrial fibrillation: Secondary | ICD-10-CM

## 2023-06-16 DIAGNOSIS — G4733 Obstructive sleep apnea (adult) (pediatric): Secondary | ICD-10-CM | POA: Diagnosis present

## 2023-06-16 DIAGNOSIS — I5023 Acute on chronic systolic (congestive) heart failure: Secondary | ICD-10-CM | POA: Diagnosis present

## 2023-06-16 DIAGNOSIS — Z8249 Family history of ischemic heart disease and other diseases of the circulatory system: Secondary | ICD-10-CM | POA: Diagnosis not present

## 2023-06-16 DIAGNOSIS — R7989 Other specified abnormal findings of blood chemistry: Secondary | ICD-10-CM

## 2023-06-16 DIAGNOSIS — N179 Acute kidney failure, unspecified: Secondary | ICD-10-CM | POA: Diagnosis present

## 2023-06-16 DIAGNOSIS — I513 Intracardiac thrombosis, not elsewhere classified: Secondary | ICD-10-CM | POA: Diagnosis present

## 2023-06-16 DIAGNOSIS — I361 Nonrheumatic tricuspid (valve) insufficiency: Secondary | ICD-10-CM | POA: Diagnosis not present

## 2023-06-16 DIAGNOSIS — R0602 Shortness of breath: Secondary | ICD-10-CM | POA: Diagnosis present

## 2023-06-16 DIAGNOSIS — Z7982 Long term (current) use of aspirin: Secondary | ICD-10-CM | POA: Diagnosis not present

## 2023-06-16 DIAGNOSIS — I5042 Chronic combined systolic (congestive) and diastolic (congestive) heart failure: Secondary | ICD-10-CM | POA: Diagnosis not present

## 2023-06-16 DIAGNOSIS — E66813 Obesity, class 3: Secondary | ICD-10-CM | POA: Diagnosis present

## 2023-06-16 DIAGNOSIS — D509 Iron deficiency anemia, unspecified: Secondary | ICD-10-CM | POA: Diagnosis present

## 2023-06-16 DIAGNOSIS — I50813 Acute on chronic right heart failure: Secondary | ICD-10-CM

## 2023-06-16 DIAGNOSIS — E871 Hypo-osmolality and hyponatremia: Secondary | ICD-10-CM | POA: Diagnosis present

## 2023-06-16 DIAGNOSIS — I081 Rheumatic disorders of both mitral and tricuspid valves: Secondary | ICD-10-CM | POA: Diagnosis not present

## 2023-06-16 DIAGNOSIS — I429 Cardiomyopathy, unspecified: Secondary | ICD-10-CM

## 2023-06-16 DIAGNOSIS — I340 Nonrheumatic mitral (valve) insufficiency: Secondary | ICD-10-CM | POA: Diagnosis not present

## 2023-06-16 DIAGNOSIS — I428 Other cardiomyopathies: Secondary | ICD-10-CM | POA: Diagnosis present

## 2023-06-16 DIAGNOSIS — Z7984 Long term (current) use of oral hypoglycemic drugs: Secondary | ICD-10-CM | POA: Diagnosis not present

## 2023-06-16 DIAGNOSIS — I480 Paroxysmal atrial fibrillation: Secondary | ICD-10-CM | POA: Diagnosis not present

## 2023-06-16 DIAGNOSIS — I5082 Biventricular heart failure: Secondary | ICD-10-CM | POA: Diagnosis present

## 2023-06-16 DIAGNOSIS — Z79899 Other long term (current) drug therapy: Secondary | ICD-10-CM | POA: Diagnosis not present

## 2023-06-16 LAB — HEPATIC FUNCTION PANEL
ALT: 41 U/L (ref 0–44)
AST: 27 U/L (ref 15–41)
Albumin: 3.8 g/dL (ref 3.5–5.0)
Alkaline Phosphatase: 60 U/L (ref 38–126)
Bilirubin, Direct: 0.3 mg/dL — ABNORMAL HIGH (ref 0.0–0.2)
Indirect Bilirubin: 1.4 mg/dL — ABNORMAL HIGH (ref 0.3–0.9)
Total Bilirubin: 1.7 mg/dL — ABNORMAL HIGH (ref 0.0–1.2)
Total Protein: 7.6 g/dL (ref 6.5–8.1)

## 2023-06-16 LAB — ECHOCARDIOGRAM COMPLETE
AR max vel: 2.6 cm2
AV Area VTI: 2.21 cm2
AV Area mean vel: 2.23 cm2
AV Mean grad: 1 mmHg
AV Peak grad: 2.1 mmHg
Ao pk vel: 0.73 m/s
Area-P 1/2: 6.6 cm2
Calc EF: 23.8 %
Est EF: 20
Height: 74 in
MV VTI: 1.3 cm2
S' Lateral: 6.2 cm
Single Plane A2C EF: 36.9 %
Single Plane A4C EF: 23.6 %
Weight: 6000 [oz_av]

## 2023-06-16 LAB — URINALYSIS, ROUTINE W REFLEX MICROSCOPIC
Bacteria, UA: NONE SEEN
Bilirubin Urine: NEGATIVE
Glucose, UA: 150 mg/dL — AB
Hgb urine dipstick: NEGATIVE
Ketones, ur: NEGATIVE mg/dL
Leukocytes,Ua: NEGATIVE
Nitrite: NEGATIVE
Protein, ur: 100 mg/dL — AB
Specific Gravity, Urine: 1.02 (ref 1.005–1.030)
pH: 5 (ref 5.0–8.0)

## 2023-06-16 LAB — BASIC METABOLIC PANEL
Anion gap: 13 (ref 5–15)
BUN: 28 mg/dL — ABNORMAL HIGH (ref 6–20)
CO2: 20 mmol/L — ABNORMAL LOW (ref 22–32)
Calcium: 9.1 mg/dL (ref 8.9–10.3)
Chloride: 99 mmol/L (ref 98–111)
Creatinine, Ser: 1.81 mg/dL — ABNORMAL HIGH (ref 0.61–1.24)
GFR, Estimated: 46 mL/min — ABNORMAL LOW (ref >60)
Glucose, Bld: 148 mg/dL — ABNORMAL HIGH (ref 70–99)
Potassium: 3.9 mmol/L (ref 3.5–5.1)
Sodium: 132 mmol/L — ABNORMAL LOW (ref 135–145)

## 2023-06-16 LAB — RAPID URINE DRUG SCREEN, HOSP PERFORMED
Amphetamines: NOT DETECTED
Barbiturates: NOT DETECTED
Benzodiazepines: NOT DETECTED
Cocaine: NOT DETECTED
Opiates: NOT DETECTED
Tetrahydrocannabinol: NOT DETECTED

## 2023-06-16 LAB — MAGNESIUM: Magnesium: 2.1 mg/dL (ref 1.7–2.4)

## 2023-06-16 LAB — FERRITIN: Ferritin: 66 ng/mL (ref 24–336)

## 2023-06-16 LAB — IRON AND TIBC
Iron: 49 ug/dL (ref 45–182)
Saturation Ratios: 11 % — ABNORMAL LOW (ref 17.9–39.5)
TIBC: 449 ug/dL (ref 250–450)
UIBC: 400 ug/dL

## 2023-06-16 LAB — BRAIN NATRIURETIC PEPTIDE: B Natriuretic Peptide: 244 pg/mL — ABNORMAL HIGH (ref 0.0–100.0)

## 2023-06-16 LAB — TSH: TSH: 3.584 u[IU]/mL (ref 0.350–4.500)

## 2023-06-16 LAB — TROPONIN I (HIGH SENSITIVITY): Troponin I (High Sensitivity): 48 ng/L — ABNORMAL HIGH (ref <18)

## 2023-06-16 LAB — LACTIC ACID, PLASMA: Lactic Acid, Venous: 1.3 mmol/L (ref 0.5–1.9)

## 2023-06-16 MED ORDER — ENOXAPARIN SODIUM 300 MG/3ML IJ SOLN
170.0000 mg | Freq: Two times a day (BID) | INTRAMUSCULAR | Status: DC
  Administered 2023-06-16: 170 mg via SUBCUTANEOUS
  Filled 2023-06-16 (×3): qty 1.7

## 2023-06-16 MED ORDER — EMPAGLIFLOZIN 10 MG PO TABS
10.0000 mg | ORAL_TABLET | Freq: Every day | ORAL | Status: DC
  Filled 2023-06-16 (×3): qty 1

## 2023-06-16 MED ORDER — METOPROLOL TARTRATE 5 MG/5ML IV SOLN
5.0000 mg | Freq: Once | INTRAVENOUS | Status: AC
  Administered 2023-06-16: 5 mg via INTRAVENOUS
  Filled 2023-06-16: qty 5

## 2023-06-16 MED ORDER — FUROSEMIDE 10 MG/ML IJ SOLN
40.0000 mg | Freq: Once | INTRAMUSCULAR | Status: AC
  Administered 2023-06-16: 40 mg via INTRAVENOUS
  Filled 2023-06-16: qty 4

## 2023-06-16 MED ORDER — ROSUVASTATIN CALCIUM 5 MG PO TABS
10.0000 mg | ORAL_TABLET | Freq: Every day | ORAL | Status: DC
  Filled 2023-06-16: qty 1

## 2023-06-16 MED ORDER — AMIODARONE HCL IN DEXTROSE 360-4.14 MG/200ML-% IV SOLN
60.0000 mg/h | INTRAVENOUS | Status: AC
  Administered 2023-06-16 (×2): 60 mg/h via INTRAVENOUS
  Filled 2023-06-16: qty 200

## 2023-06-16 MED ORDER — SACUBITRIL-VALSARTAN 49-51 MG PO TABS: 1.0000 | ORAL_TABLET | Freq: Two times a day (BID) | ORAL | Status: DC

## 2023-06-16 MED ORDER — ROSUVASTATIN CALCIUM 20 MG PO TABS
40.0000 mg | ORAL_TABLET | Freq: Every day | ORAL | Status: DC
Start: 2023-06-17 — End: 2023-06-22
  Filled 2023-06-16 (×5): qty 2

## 2023-06-16 MED ORDER — HEPARIN (PORCINE) 25000 UT/250ML-% IV SOLN
2400.0000 [IU]/h | INTRAVENOUS | Status: DC
  Administered 2023-06-16: 1750 [IU]/h via INTRAVENOUS
  Administered 2023-06-18: 1850 [IU]/h via INTRAVENOUS
  Administered 2023-06-18: 1750 [IU]/h via INTRAVENOUS
  Administered 2023-06-19: 2400 [IU]/h via INTRAVENOUS
  Filled 2023-06-16 (×5): qty 250

## 2023-06-16 MED ORDER — METHYLPREDNISOLONE SODIUM SUCC 40 MG IJ SOLR
40.0000 mg | Freq: Two times a day (BID) | INTRAMUSCULAR | Status: DC
  Administered 2023-06-16: 40 mg via INTRAVENOUS
  Filled 2023-06-16 (×2): qty 1

## 2023-06-16 MED ORDER — ONDANSETRON HCL 4 MG/2ML IJ SOLN: 4.0000 mg | Freq: Four times a day (QID) | INTRAMUSCULAR | Status: DC | PRN

## 2023-06-16 MED ORDER — ASPIRIN 81 MG PO CHEW
81.0000 mg | CHEWABLE_TABLET | Freq: Once | ORAL | Status: AC
  Administered 2023-06-16: 81 mg via ORAL
  Filled 2023-06-16: qty 1

## 2023-06-16 MED ORDER — DOCUSATE SODIUM 100 MG PO CAPS: 100.0000 mg | ORAL_CAPSULE | Freq: Every day | ORAL | Status: DC | PRN

## 2023-06-16 MED ORDER — PERFLUTREN LIPID MICROSPHERE
1.0000 mL | INTRAVENOUS | Status: AC | PRN
  Administered 2023-06-16: 3 mL via INTRAVENOUS

## 2023-06-16 MED ORDER — CARVEDILOL 3.125 MG PO TABS: 6.2500 mg | ORAL_TABLET | Freq: Two times a day (BID) | ORAL | Status: DC

## 2023-06-16 MED ORDER — ONDANSETRON HCL 4 MG PO TABS: 4.0000 mg | ORAL_TABLET | Freq: Four times a day (QID) | ORAL | Status: DC | PRN

## 2023-06-16 MED ORDER — FUROSEMIDE 10 MG/ML IJ SOLN
20.0000 mg/h | INTRAVENOUS | Status: DC
  Administered 2023-06-16 – 2023-06-17 (×2): 20 mg/h via INTRAVENOUS
  Filled 2023-06-16 (×2): qty 20

## 2023-06-16 MED ORDER — POTASSIUM CHLORIDE CRYS ER 20 MEQ PO TBCR
40.0000 meq | EXTENDED_RELEASE_TABLET | Freq: Once | ORAL | Status: AC
  Administered 2023-06-16: 40 meq via ORAL
  Filled 2023-06-16: qty 2

## 2023-06-16 MED ORDER — FUROSEMIDE 40 MG PO TABS: 40.0000 mg | ORAL_TABLET | ORAL | Status: DC | PRN

## 2023-06-16 MED ORDER — CARVEDILOL 12.5 MG PO TABS: 12.5000 mg | ORAL_TABLET | Freq: Two times a day (BID) | ORAL | Status: DC

## 2023-06-16 MED ORDER — DILTIAZEM HCL-DEXTROSE 125-5 MG/125ML-% IV SOLN (PREMIX)
5.0000 mg/h | INTRAVENOUS | Status: DC
  Administered 2023-06-16: 15 mg/h via INTRAVENOUS
  Administered 2023-06-16: 5 mg/h via INTRAVENOUS
  Filled 2023-06-16 (×2): qty 125

## 2023-06-16 MED ORDER — POLYETHYLENE GLYCOL 3350 17 G PO PACK: 17.0000 g | PACK | Freq: Every day | ORAL | Status: DC | PRN

## 2023-06-16 MED ORDER — AMIODARONE HCL IN DEXTROSE 360-4.14 MG/200ML-% IV SOLN
30.0000 mg/h | INTRAVENOUS | Status: DC
  Administered 2023-06-16: 30 mg/h via INTRAVENOUS
  Administered 2023-06-17 (×2): 60 mg/h via INTRAVENOUS
  Administered 2023-06-18: 30 mg/h via INTRAVENOUS
  Administered 2023-06-18 (×2): 60 mg/h via INTRAVENOUS
  Administered 2023-06-19 – 2023-06-20 (×3): 30 mg/h via INTRAVENOUS
  Filled 2023-06-16 (×13): qty 200

## 2023-06-16 MED ORDER — MILRINONE LACTATE IN DEXTROSE 20-5 MG/100ML-% IV SOLN
0.2500 ug/kg/min | INTRAVENOUS | Status: DC
  Administered 2023-06-16 – 2023-06-18 (×6): 0.25 ug/kg/min via INTRAVENOUS
  Filled 2023-06-16 (×6): qty 100

## 2023-06-16 MED ORDER — FUROSEMIDE 10 MG/ML IJ SOLN
120.0000 mg | Freq: Once | INTRAVENOUS | Status: AC
  Administered 2023-06-16: 120 mg via INTRAVENOUS
  Filled 2023-06-16: qty 10

## 2023-06-16 MED ORDER — ACETAMINOPHEN 325 MG PO TABS: 650.0000 mg | ORAL_TABLET | Freq: Four times a day (QID) | ORAL | Status: DC | PRN

## 2023-06-16 MED ORDER — ENOXAPARIN SODIUM 300 MG/3ML IJ SOLN
170.0000 mg | Freq: Two times a day (BID) | INTRAMUSCULAR | Status: DC
  Filled 2023-06-16 (×3): qty 1.7

## 2023-06-16 MED ORDER — FUROSEMIDE 40 MG PO TABS
40.0000 mg | ORAL_TABLET | Freq: Every day | ORAL | Status: DC
  Filled 2023-06-16: qty 1

## 2023-06-16 MED ORDER — ENOXAPARIN SODIUM 40 MG/0.4ML IJ SOSY: 40.0000 mg | PREFILLED_SYRINGE | INTRAMUSCULAR | Status: DC

## 2023-06-16 MED ORDER — ACETAMINOPHEN 650 MG RE SUPP: 650.0000 mg | Freq: Four times a day (QID) | RECTAL | Status: DC | PRN

## 2023-06-16 MED ORDER — AMIODARONE LOAD VIA INFUSION
150.0000 mg | Freq: Once | INTRAVENOUS | Status: AC
  Administered 2023-06-16: 150 mg via INTRAVENOUS
  Filled 2023-06-16: qty 83.34

## 2023-06-16 NOTE — ED Notes (Signed)
 Pt just had Echo done. Awaiting carelink

## 2023-06-16 NOTE — Progress Notes (Addendum)
 Labs reviewed. Lactic acid wnl. Cr rising further to 1.81. Hold on further IV lasix per d/w Dr. Diona Browner. K 3.9, hold on KCl replacement given rising creatinine, supplementation earlier this AM, and value close to 4.0. Mg OK.

## 2023-06-16 NOTE — Progress Notes (Signed)
   06/16/23 1453  TOC Brief Assessment  Insurance and Status Reviewed  Patient has primary care physician Yes  Home environment has been reviewed Home with spouse  Prior level of function: independent  Prior/Current Home Services No current home services  Social Drivers of Health Review SDOH reviewed no interventions necessary  Readmission risk has been reviewed Yes  Transition of care needs no transition of care needs at this time   Cardiology following  Transition of Care Department [facility]) has reviewed patient and no TOC needs have been identified at this time. We will continue to monitor patient advancement through interdisciplinary progression rounds. If new patient transition needs arise, please place a TOC consult.

## 2023-06-16 NOTE — Progress Notes (Signed)
 Consent obtained for bedside PICC placement. Attempted x 2 with PICC unable to pass R clavicle area, despite manipulation. 3rd PIV established for multiple drips. Primary RN notified that 2nd PICC RN could attempt again on 2-27 or IR could place on 2-27 as well.

## 2023-06-16 NOTE — Consult Note (Signed)
 Advanced Heart Failure Team Consult Note   Primary Physician: Patient, [name] Cardiologist:  Chrystie Nose, MD  Reason for Consultation: Acute on Chronic Biventricular Heart Failure  HPI:    Jaxsun Ciampi is seen today for evaluation of Acute on [place] Biventricular heart Failure at the request of Dr. Flossie Dibble.   Ranjit Ashurst is a 48 y.o. male with a hx of chronic HFrEF with presumed NICM, morbid obesity, OSA intolerant of CPAP, HTN, mild aneurysmal dilation of aorta previously seen by CT 2019, cholelithiasis, 3mm pulm nodule, severe hepatic steatosis, colonic diverticulosis, and ventral hernia.  Established care with [facility] in 2016. EF 20-25%. Lost to follow up until 2019. Nuclear stress in 2019 showed mod inferioe defect consistednt with prior infact. Has continued to follow with intermittent lapses in medication adherence.   Echo 7/23 showed EF 20-25% and gHK.   Earlier this month developed viral URI and was treated with Augmentin, however symptoms worsened and he developed palpitations and abdominal bloating. He then took PRN Lasix without increase in UOP and developed progressive shortness of breath.   Presented to [facility] 06/15/23, on arrival he was tachpneic and noted to be in atrial fibrillation with RVR. He was given Lovenox and started on IV diltiazem gtt, and given Lopressor 5 mg IV, Lasix IV 40 mg, solumedrol 40 mg. CXR with cardiomegaly with with pulm edema. Cardiology consulted. Dilt was stopped and he was started on amio gtt.   Echo today with EF 20%, gHK, no LV thrombus, severely reduced RV (newly reduced).  On exam, patient sitting up on the side of the bed. Reports poor response to IV Lasix, intermittently poor appetite, significant swelling and shortness of breath. Patient dyspneic on exam with conversation. Cool extremities. Narrow pulse pressures with AF rates in 120-130s.   Home Medications Prior to Admission medications   Medication Sig  Start Date End Date Taking? Authorizing Provider  amoxicillin-clavulanate (AUGMENTIN) 875-125 MG tablet Take 1 tablet by mouth 2 (two) times daily. 06/07/23 06/17/23 Yes [provider]  benzonatate (TESSALON) 100 MG capsule Take 100 mg by mouth 3 (three) times daily as needed. 06/07/23  Yes [provider]  carvedilol (COREG) 6.25 MG tablet Take 1 tablet (6.25 mg total) by mouth 2 (two) times daily. 03/17/23  Yes Sharlene Dory, NP  empagliflozin (JARDIANCE) 10 MG TABS tablet Take 1 tablet (10 mg total) by mouth daily before breakfast. 03/17/23  Yes Sharlene Dory, NP  furosemide (LASIX) 40 MG tablet Take 1 tablet (40 mg total) by mouth as needed for fluid or edema. 03/17/23  Yes Sharlene Dory, NP  rosuvastatin (CRESTOR) 10 MG tablet Take 1 tablet (10 mg total) by mouth daily. 04/08/23 07/07/23 Yes Sharlene Dory, NP  sacubitril-valsartan [facility]) [phone] MG Take 1 tablet by mouth twice daily 03/31/23  Yes Sharlene Dory, NP  simethicone (MYLICON) 80 MG chewable tablet Chew 80 mg by mouth every 6 (six) hours as needed for flatulence.   Yes [provider]  albuterol (VENTOLIN HFA) 108 (90 Base) MCG/ACT inhaler SMARTSIG:2 Puff(s) By Mouth Every 6 Hours PRN Patient not taking: Reported on 06/16/2023 01/16/22   [provider]    Past Medical History: Past Medical History:  Diagnosis Date   Cardiomyopathy (HCC)    Cholelithiasis    Chronic HFrEF (heart failure with reduced ejection fraction) (HCC)    Diverticulosis    Hepatic steatosis    Hypertension    Morbid obesity (HCC)    OSA (obstructive sleep apnea)  Pneumonia    Pulmonary nodule     Past Surgical History: Past Surgical History:  Procedure Laterality Date   NO PAST SURGERIES      Family History: Family History  Problem Relation Age of Onset   Hypertension Mother    Hypertension Father     Social History: Social History   Socioeconomic History   Marital status: Married    Spouse  name: Not on file   Number of children: Not on file   Years of education: Not on file   Highest education level: Not on file  Occupational History   Not on file  Tobacco Use   Smoking status: Never   Smokeless tobacco: Current    Types: Snuff  Vaping Use   Vaping status: Never Used  Substance and Sexual Activity   Alcohol use: Yes    Comment: occas - 12 beers/yr   Drug use: No   Sexual activity: Not on file  Other Topics Concern   Not on file  Social History Narrative   Not on file   Social Drivers of Health   Financial Resource Strain: Not on file  Food Insecurity: No Food Insecurity (06/16/2023)   Hunger Vital Sign    Worried About Running Out of Food in the Last Year: Never true    Ran Out of Food in the Last Year: Never true  Transportation Needs: No Transportation Needs (06/16/2023)   PRAPARE - Administrator, Civil Service (Medical): No    Lack of Transportation (Non-Medical): No  Physical Activity: Not on file  Stress: Not on file  Social Connections: Not on file    Allergies:  Allergies  Allergen Reactions   Codeine Other (See Comments)    Severe headache Pt reports being able to take percocet & vicodin    Objective:    Vital Signs:   Temp:  [97.5 F (36.4 C)-98.3 F (36.8 C)] 97.8 F (36.6 C) (02/26 1426) Pulse Rate:  [69-142] 92 (02/26 1500) Resp:  [16-35] 25 (02/26 1500) BP: (91-147)/(67-110) 147/107 (02/26 1554) SpO2:  [79 %-96 %] 92 % (02/26 1500) Weight:  [170.1 kg] 170.1 kg (02/25 1838) Last BM Date : 06/16/23  Weight change: Filed Weights   06/15/23 1838  Weight: (!) 170.1 kg    Intake/Output:   Intake/Output Summary (Last 24 hours) at 06/16/2023 1646 Last data filed at 06/16/2023 1551 Gross per 24 hour  Intake 29.9 ml  Output 175 ml  Net -145.1 ml    Physical Exam    General: Obese appearing. Dyspneic on [place] Cardiac: JVP elevated. S1 and S2 present. No murmurs or rub. Extremities: Warm and dry. No rash, cyanosis.   3+ edema up to trunk. Neuro: Alert and oriented x3. Affect pleasant. Moves all extremities without difficulty.  Telemetry   AF in 110-120s (personally reviewed)  EKG    AF 138 bpm (personally reviewed)  Labs   Basic Metabolic Panel: Recent Labs  Lab 06/15/23 1935 06/16/23 1106  NA 138 132*  K 3.3* 3.9  CL 102 99  CO2 23 20*  GLUCOSE 104* 148*  BUN 23* 28*  CREATININE 1.41* 1.81*  CALCIUM 9.2 9.1  MG  --  2.1    Liver Function Tests: Recent Labs  Lab 06/16/23 1106  AST 27  ALT 41  ALKPHOS 60  BILITOT 1.7*  PROT 7.6  ALBUMIN 3.8   No results for input(s): "LIPASE", "AMYLASE" in the last 168 hours. No results for input(s): "AMMONIA" in the last  168 hours.  CBC: Recent Labs  Lab 06/15/23 1935  WBC 8.1  HGB 12.5*  HCT 39.7  MCV 76.1*  PLT 187    Cardiac Enzymes: No results for input(s): "CKTOTAL", "CKMB", "CKMBINDEX", "TROPONINI" in the last 168 hours.  BNP: BNP (last 3 results) Recent Labs    06/15/23 1935  BNP 244.0*    ProBNP (last 3 results) No results for input(s): "PROBNP" in the last 8760 hours.   CBG: No results for input(s): "GLUCAP" in the last 168 hours.  Coagulation Studies: No results for input(s): "LABPROT", "INR" in the last 72 hours.   Imaging   ECHOCARDIOGRAM COMPLETE Result Date: 06/16/2023    ECHOCARDIOGRAM REPORT   Patient Name:   NATHON STEFANSKI Date of Exam: 06/16/2023 Medical Rec #:  [id number]        Height:       74.0 in Accession #:    [id number]       Weight:       375.0 lb Date of Birth:  [birth year]        BSA:          2.839 m Patient Age:    48 years         BP:           98/83 mmHg Patient Gender: M                HR:           84 bpm. [facility] Procedure: 2D Echo, Cardiac Doppler, Color Doppler and Intracardiac            Opacification Agent (Both Spectral and Color Flow Doppler were            utilized during procedure). Indications:    CHF-acute systolic  History:        Patient has prior  history of Echocardiogram examinations, most                 recent 10/20/2021. CHF and Cardiomyopathy, Arrythmias:Atrial                 Fibrillation; Risk Factors:Hypertension and Former Smoker. NICM.  Sonographer:    Vern Claude Referring Phys: [id number] OLADAPO ADEFESO  Sonographer Comments: Image acquisition challenging due to patient body habitus. IMPRESSIONS  1. Images are limited.  2. Left ventricular ejection fraction, by estimation, is approximately 20%. The left ventricle has severely decreased function. Left ventricular endocardial border not optimally defined to evaluate regional wall motion. The left ventricular internal cavity size was moderately to severely dilated. There is mild concentric left ventricular hypertrophy. Left ventricular diastolic parameters are indeterminate.  3. Definity contrast shows no obvious formed LV mural thrombus.  4. Right ventricular systolic function is severely reduced. The right ventricular size is normal. Tricuspid regurgitation signal is inadequate for assessing PA pressure.  5. The mitral valve is grossly normal. Trivial mitral valve regurgitation.  6. The aortic valve was not well visualized. Aortic valve regurgitation is not visualized. Aortic valve mean gradient measures 1.0 mmHg.  7. The inferior vena cava is dilated in size with <50% respiratory variability, suggesting right atrial pressure of 15 mmHg. Comparison(s): Prior images reviewed side by side. LVEF severely reduced at approximately 20%, RV dysfunction is new. FINDINGS  Left Ventricle: Left ventricular ejection fraction, by estimation, is 20%. The left ventricle has severely decreased function. Left ventricular endocardial border not optimally defined to evaluate regional wall motion. Strain imaging was not performed. The left  ventricular internal cavity size was moderately to severely dilated. There is mild concentric left ventricular hypertrophy. Left ventricular diastolic function could not be evaluated  due to atrial fibrillation. Left ventricular diastolic parameters are indeterminate. Right Ventricle: The right ventricular size is normal. Right vetricular wall thickness was not well visualized. Right ventricular systolic function is severely reduced. Tricuspid regurgitation signal is inadequate for assessing PA pressure. Left Atrium: Left atrial size was normal in size. Right Atrium: Right atrial size was normal in size. Pericardium: Trivial pericardial effusion is present. The pericardial effusion is posterior to the left ventricle and lateral to the left ventricle. Mitral Valve: The mitral valve is grossly normal. Trivial mitral valve regurgitation. MV peak gradient, 4.8 mmHg. The mean mitral valve gradient is 2.0 mmHg. Tricuspid Valve: The tricuspid valve is not well visualized. Tricuspid valve regurgitation is trivial. Aortic Valve: The aortic valve was not well visualized. There is mild aortic valve annular calcification. Aortic valve regurgitation is not visualized. Aortic valve mean gradient measures 1.0 mmHg. Aortic valve peak gradient measures 2.1 mmHg. Aortic valve area, by VTI measures 2.21 cm. Pulmonic Valve: The pulmonic valve was not well visualized. Pulmonic valve regurgitation is trivial. Aorta: The aortic root and ascending aorta are structurally normal, with no evidence of dilitation. Venous: The inferior vena cava is dilated in size with less than 50% respiratory variability, suggesting right atrial pressure of 15 mmHg. IAS/Shunts: The interatrial septum was not well visualized. Additional Comments: 3D imaging was not performed.  LEFT VENTRICLE PLAX 2D LVIDd:         7.20 cm      Diastology LVIDs:         6.20 cm      LV e' lateral:   8.05 cm/s LV PW:         1.10 cm      LV E/e' lateral: 12.0 LV IVS:        0.90 cm LVOT diam:     2.00 cm LV SV:         20 LV SV Index:   7 LVOT Area:     3.14 cm  LV Volumes (MOD) LV vol d, MOD A2C: 328.0 ml LV vol d, MOD A4C: 453.0 ml LV vol s, MOD A2C: 207.0  ml LV vol s, MOD A4C: 346.0 ml LV SV MOD A2C:     121.0 ml LV SV MOD A4C:     453.0 ml LV SV MOD BP:      98.6 ml RIGHT VENTRICLE            IVC RV S prime:     6.96 cm/s  IVC diam: 2.50 cm LEFT ATRIUM             Index        RIGHT ATRIUM           Index LA diam:        3.20 cm 1.13 cm/m   RA Area:     12.00 cm LA Vol (A2C):   56.9 ml 20.04 ml/m  RA Volume:   20.20 ml  7.11 ml/m LA Vol (A4C):   77.0 ml 27.12 ml/m LA Biplane Vol: 72.7 ml 25.61 ml/m  AORTIC VALVE                    PULMONIC VALVE AV Area (Vmax):    2.60 cm     PV Vmax:       0.56 m/s AV Area (Vmean):  2.23 cm     PV Peak grad:  1.2 mmHg AV Area (VTI):     2.21 cm AV Vmax:           73.30 cm/s AV Vmean:          44.900 cm/s AV VTI:            0.089 m AV Peak Grad:      2.1 mmHg AV Mean Grad:      1.0 mmHg LVOT Vmax:         60.60 cm/s LVOT Vmean:        31.900 cm/s LVOT VTI:          0.063 m LVOT/AV VTI ratio: 0.70  AORTA Ao Root diam: 3.20 cm Ao Asc diam:  2.80 cm MITRAL VALVE MV Area (PHT): 6.60 cm    SHUNTS MV Area VTI:   1.30 cm    Systemic VTI:  0.06 m MV Peak grad:  4.8 mmHg    Systemic Diam: 2.00 cm MV Mean grad:  2.0 mmHg MV Vmax:       1.10 m/s MV Vmean:      66.4 cm/s MV Decel Time: 115 msec MV E velocity: 96.90 cm/s Nona Dell MD Electronically signed by Nona Dell MD Signature Date/Time: 06/16/2023/3:38:25 PM    Final    DG Chest 2 View Result Date: 06/15/2023 CLINICAL DATA:  Shortness of breath. Congestion. History of congestive heart failure. Bloated feeling and short of breath. EXAM: CHEST - 2 VIEW COMPARISON:  04/28/2017 FINDINGS: Cardiac enlargement with central pulmonary vascular congestion and bilateral perihilar infiltrates consistent with edema. No pleural effusion or pneumothorax. Mediastinal contours appear intact. Degenerative changes in the spine. IMPRESSION: Cardiac enlargement with pulmonary vascular congestion and perihilar infiltrates, likely edema. Electronically Signed   By: Burman Nieves M.D.    On: 06/15/2023 19:13   Medications:    Current Medications:  rosuvastatin  10 mg Oral Daily    Infusions:  amiodarone     heparin     Patient Profile   Ronold Hardgrove is a 48 y.o. 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  Admitted with CHF exacerbation and AF RVR  Assessment/Plan   Acute on Chronic Biventricular Heart Failure - EF previously 35%, documented as NICM. Can not rule out ICM with metabolic syndrome. Never had LHC. Reports that his HF exacerbations are preceded by URIs, could be viral myocarditis, however symptoms of URI could be driven by HF.  - Nuclear Stress in 2019 showed moderate inferior defect consistent with prior infarct.  - Echo: EF 20%, gHK, no LV thrombus, severely reduced RV (newly reduced) - Suspect low output. Cool extremities, AKI with poor urine response.  - Place PICC line. Coox/CVP monitoring - GDMT limited by AKI - Start Milrinone 0.25 mcg/kg/min - Given Lasix 120 mg IV + Lasix 20 mg/hr - No bb with concern for low output - Place unna boots - May eventually need R/LHC for ischemic eval and new RV failure.   Afib with RVR - continue amio gtt - continue heparin gtt  AKI - cardiorenal - Cr 1.4 on admit - up to 1.81 with diuresis - start milrinone + diuresis as above  Severe OSA Obesity - severe on sleep study in 2023 - noncompliant with CPAP - Body  mass index is 48.15 kg/m. - discussed need for weight loss  HTN - start milrinone - add GDMT tomorrow as able  HLD - lipid panel in the am - increase crestor to 40 mg daily - start asa 81 mg daily  Length of Stay: 0  [place] Januel Doolan, NP  06/16/2023, 4:46 PM  Advanced Heart Failure Team Pager [phone] (M-F; 7a - 5p)  Please contact [facility] for  night-coverage after hours (4p -7a ) and weekends on [email or web address]

## 2023-06-16 NOTE — ED Notes (Signed)
 Pt sats 89-93, placed on 2L [place] but pt wants in mouth and states "Im a mouth breather"

## 2023-06-16 NOTE — ED Notes (Signed)
 Received report from [facility]. Pt resting. Sitting up in bed at times. A/o. Color wnl. Denies pain or shob. Mild shob. BLL swelling noted with one plus pitting edema. Pt states they were not like that yesterday. Cardizem at 53ml/hr with hr 90s-110. Aware awaiting admission

## 2023-06-16 NOTE — ED Notes (Signed)
[facility] here and report given 

## 2023-06-16 NOTE — Hospital Course (Addendum)
 Cole Singleton is a 48 y.o. male with medical history significant of chronic HFrEF, hyperlipidemia who presents to the emergency department due to 8 to 10-day onset of increasing or worsening shortness of breath.  Patient complained of family having viral illness last week and is started to have sinus congestion, so he went to an urgent care where he was prescribed with antibiotics.  He complained of worsening shortness of breath when laying flat and has not been able to sleep well in the last 4 to 5 days.  Patient endorsed increased heart rates and weight gain.     ED Course:   Tachycardic, tachypneic, BP was 102/69 and other vital signs were within normal range.  Workup in the ED showed microcytic anemia, BMP was normal except for potassium of 3.3, blood glucose 104, BUN/creatinine 23/1.41 (baseline creatinine at 1.1). Chest x-ray showed cardiac enlargement with pulmonary vascular congestion and perihilar infiltrates, likely edema Patient was treated with IV Lasix 40 mg x 1, IV Lopressor 5 mg x 1 was given due to atrial fibrillation without much effect, he was then started on IV Cardizem drip.  Hospitalist was asked to admit patient for further evaluation and management.      Assessment & Plan:   Principal Problem:   Acute exacerbation of CHF (congestive heart failure) (HCC) Active Problems:   Microcytic anemia   Hypokalemia   Elevated brain natriuretic peptide (BNP) level   Paroxysmal atrial fibrillation with RVR (HCC)   Elevated troponin   AKI (acute kidney injury) (HCC)   Obesity, Class III, BMI 40-49.9 (morbid obesity) (HCC)   Cardiomyopathy (HCC)  Assessment/Plan :  Acute on chronic HFrEF -Monitoring daily weight, I's and O's, fluid restriction -Holding IV Lasix due to soft BP  Continue heart healthy diet      Echocardiogram done on 11/16/2021 showed LVEF of 20 to 25%.  LV demonstrates global hypokinesis.   EP echocardiogram:     New onset persistent A-fib with RVR -Was  on Cardizem drip, switched to amiodarone by cardiology -Lovenox switched to heparin drip -Cardiology consulted following closely CHA2DS2-VASc Score = 2   This indicates a 2.2% annual risk of stroke. The patient's score is based upon: CHF History: 1 HTN History: 1 Cardioversion and possible chronic anticoagulation per cardiology  Elevated BNP BNP 244 (chronically elevated), this was 209 about 6 years ago Continue to monitor BNP    Hypokalemia K+ 3.3, this will be replenished   Elevated troponin possibly due to type II demand ischemia Troponin 46 > 49; patient denies chest pain Continue to trend troponin   Microcytic anemia MCV 76.1, hemoglobin 12.5, iron studies:    Acute kidney injury BUN/creatinine 23/1.41 (baseline creatinine at 1.1) Renally adjust medications, avoid nephrotoxic agents/dehydration/hypotension   Morbid obesity (BMI 48.15) Body mass index is 48.15 kg/m.  Patient was counseled about the cardiovascular and metabolic risk of morbid obesity. Patient was counseled for diet control, exercise regimen and weight loss.

## 2023-06-16 NOTE — Progress Notes (Signed)
   Per cardiology request  Transfer to [facility]- for heart failure team to follow   Patient with new onset A-fib with RVR, CHF failure with ejection fraction of 20% Urology has switched IV Cardizem to IV amiodarone Lonox to heparin drip   Positive patient to be transferred to [facility] for heart failure team for evaluation   Discussion via secure chat with cardiologist team Dr. Diona Browner, Dr. Gala Romney  Cardiology team would like to transfer the patient to [facility] as a primary and they will follow-up as consult     SIGNED: Kendell Bane, MD, FHM. FAAFP [facility],  Pager (please use [email or web address] to page/text)  Please use Epic Secure Chat for non-urgent communication (7AM-7PM) If 7PM-7AM, please contact night-coverage [email or web address],  06/16/2023, 11:22 AM

## 2023-06-16 NOTE — Consult Note (Addendum)
 Cardiology Consultation   Patient ID: [name] MRN: [id number]; DOB: 05/24/1975  Admit date: 06/15/2023 Date of Consult: 06/16/2023  PCP:  Patient, [name]   [facility] Providers Cardiologist:  Chrystie Nose, MD        Patient Profile:   Cole Singleton is a 48 y.o. male with a hx of chronic HFrEF with presumed NICM, morbid obesity, OSA intolerant of CPAP, HTN, mild aneurysmal dilation of aorta previously seen by CT 2019, cholelithiasis, 3mm pulm nodule, severe hepatic steatosis, colonic diverticulosis, ventral hernia by CT 2021 who is being seen 06/16/2023 for the evaluation of AF RVR and CHF at the request of Dr. Flossie Dibble.  History of Present Illness:   [name] was reportedly diagnosed with cardiomyopathy more than a decade or so ago in [place] before he came into our care. He established with our team in 2016 for this diagnosis with LVEF 20-25%. He was lost to follow-up until seen again in 2019 when EF remained low. Nuclear stress 05/2017 showed moderate inferior defect consistent with possible prior infarct vs subdiaphragmatic attenuation, no ischemia. He has followed intermittently since that time with episodic lapses in follow-up and medication adherence. Last echo 10/2021 EF 20-25%, global HK, moderate LAE, dilated IVC, normal aortic root. He did not tolerate spironolactone due to citing concerns of lightheadedness, feeling drained. He was last seen 02/2023 after running out of his medications which were refilled. Repeat echo was ordered but not attended. Does not smoke, uses snuff, minimal ETOH (12 beers/year max), denies illicit drug use. Grandfather had MIs.  Earlier this month his family developed viral URI symptoms. He went to [facility] 2/17 after 5 days of symptoms. Covid/flu/RSV/strep were negative. He was treated with Augmentin and tessalon perles. HR listed as 68 at that visit. He otherwise reports adherence with home meds recently, no missed doses, takes  Lasix only PRN. About a week ago (~2/19) he noticed heart begin fluttering. Within a few days of taking the antibiotic he developed abdominal bloating and new LE edema. I suspect he was developing HF exacerbation at that time rather than adverse effect from the antibiotic. He took his PRN Lasix but noticed lack of UOP. He progressively got more SOB so came to the ED. Upon arrival he was tachypneic and tachycardic in AF RVR. He was ordered for Lovenox per pharmacy, started on IV diltiazem drip without bolus, received 5mg  IV Lopressor, 40mg  IV Lasix, KCl x1, IV solu-medrol 40mg . Labs show troponins 46-49-48, Hgb 12.5 (microcytic), K 3.3, Cr 1.41, BNP 244, ferritin 66, iron 49, %sat 11. CXR shows cardiac enlargement with pulmonary vascular congestion and perihilar infiltrates, likely edema. HR 95-105 AF RVR with frequent PVCs on diltiazem drip, SBP was as low as 91/72 this AM, then last checked 109/85. VS show intermittent desats, currently maintaining 93-95% on 2L. He reports about half a jug UOP with the IV Lasix, less than he would have expected.   Past Medical History:  Diagnosis Date   Cardiomyopathy (HCC)    Chronic HFrEF (heart failure with reduced ejection fraction) (HCC)    Hypertension    Morbid obesity (HCC)    OSA (obstructive sleep apnea)    Pneumonia     Past Surgical History:  Procedure Laterality Date   NO PAST SURGERIES       Home Medications:  Prior to Admission medications   Medication Sig Start Date End Date Taking? Authorizing Provider  albuterol (VENTOLIN HFA) 108 (90 Base) MCG/ACT inhaler SMARTSIG:2 Puff(s)  By Mouth Every 6 Hours PRN 01/16/22   [provider]  carvedilol (COREG) 6.25 MG tablet Take 1 tablet (6.25 mg total) by mouth 2 (two) times daily. 03/17/23   Sharlene Dory, NP  empagliflozin (JARDIANCE) 10 MG TABS tablet Take 1 tablet (10 mg total) by mouth daily before breakfast. 03/17/23   Sharlene Dory, NP  furosemide (LASIX) 40 MG tablet Take 1  tablet (40 mg total) by mouth as needed for fluid or edema. 03/17/23   Sharlene Dory, NP  potassium chloride (KLOR-CON) 10 MEQ tablet Take 1 tablet (10 mEq total) by mouth daily. 03/17/23 03/11/24  Sharlene Dory, NP  rosuvastatin (CRESTOR) 10 MG tablet Take 1 tablet (10 mg total) by mouth daily. 04/08/23 07/07/23  Sharlene Dory, NP  sacubitril-valsartan [facility]) [phone] MG Take 1 tablet by mouth twice daily 03/31/23   Sharlene Dory, NP    Inpatient Medications: Scheduled Meds:  enoxaparin (LOVENOX) injection  170 mg Subcutaneous Q12H   methylPREDNISolone (SOLU-MEDROL) injection  40 mg Intravenous Q12H   rosuvastatin  10 mg Oral Daily   Continuous Infusions:  diltiazem (CARDIZEM) infusion 15 mg/hr (06/16/23 0415)   PRN Meds: acetaminophen **OR** acetaminophen, ondansetron **OR** ondansetron (ZOFRAN) IV  Allergies:    Allergies  Allergen Reactions   Codeine Other (See Comments)    Severe headache Pt reports being able to take percocet & vicodin    Social History:   Social History   Tobacco Use   Smoking status: Never   Smokeless tobacco: Current    Types: Snuff  Substance Use Topics   Alcohol use: Yes    Comment: occas - 12 beers/yr     Family History:   Family History  Problem Relation Age of Onset   Hypertension Mother    Hypertension Father      ROS:  Please see the history of present illness.  All other ROS reviewed and negative.     Physical Exam/Data:   Vitals:   06/16/23 0803 06/16/23 0804 06/16/23 0937 06/16/23 0937  BP: 91/72   109/85  Pulse: (!) 103   96  Resp: (!) 24   (!) 25  Temp:   (!) 97.5 F (36.4 C)   TempSrc:   Oral   SpO2:  94%  93%  Weight:      Height:        Intake/Output Summary (Last 24 hours) at 06/16/2023 0945 Last data filed at 06/16/2023 0415 Gross per 24 hour  Intake 29.9 ml  Output --  Net 29.9 ml      06/15/2023    6:38 PM 03/17/2023    3:16 PM 01/28/2022    3:41 PM  Last 3 Weights  Weight (lbs) 375 lb 372  lb 361 lb  Weight (kg) 170.099 kg 168.738 kg 163.749 kg     Body mass index is 48.15 kg/m.  General: severely obese WM with mild increase WOB Head: Normocephalic, atraumatic, sclera non-icteric, no xanthomas, nares are without discharge. Neck: Negative for carotid bruits. JVP not elevated. Lungs: Clear bilaterally to auscultation without wheezes, rales, or rhonchi. Breathing is unlabored. Heart: irregularly irregular, borderline tachy, S1 S2 without murmurs, rubs, or gallops.  Abdomen: Rounded, distended with normoactive bowel sounds. No rebound/guarding. Extremities: No clubbing or cyanosis. Marked BLE edema c/w anasarca. Neuro: Alert and oriented X 3. Moves all extremities spontaneously. Psych:  Responds to questions appropriately with a normal affect.   EKG:  The EKG was personally reviewed and demonstrates:  Atrial fib RVR 117bpm, possible prior anterior  infarct, nonspecific STTW changes. F/u EKG appears similar except with one PVC.  Telemetry:  Telemetry was personally reviewed and demonstrates:  AF RVR frequent PVCs  Relevant CV Studies: 2d echo 10/2021   1. Left ventricular ejection fraction, by estimation, is 20 to 25%. The  left ventricle has severely decreased function. The left ventricle  demonstrates global hypokinesis. The left ventricular internal cavity size  was severely dilated. Left ventricular  diastolic parameters were normal.   2. Right ventricular systolic function is normal. The right ventricular  size is normal.   3. Left atrial size was moderately dilated.   4. The mitral valve is normal in structure. Trivial mitral valve  regurgitation. No evidence of mitral stenosis.   5. The aortic valve is tricuspid. Aortic valve regurgitation is not  visualized. No aortic stenosis is present.   6. The inferior vena cava is dilated in size with >50% respiratory  variability, suggesting right atrial pressure of 8 mmHg.   Laboratory Data:  High Sensitivity Troponin:    Recent Labs  Lab 06/15/23 1935 06/15/23 2109 06/16/23 0744  TROPONINIHS 46* 49* 48*     Chemistry Recent Labs  Lab 06/15/23 1935  NA 138  K 3.3*  CL 102  CO2 23  GLUCOSE 104*  BUN 23*  CREATININE 1.41*  CALCIUM 9.2  GFRNONAA >60  ANIONGAP 13    Hematology Recent Labs  Lab 06/15/23 1935  WBC 8.1  RBC 5.22  HGB 12.5*  HCT 39.7  MCV 76.1*  MCH 23.9*  MCHC 31.5  RDW 17.6*  PLT 187   BNP Recent Labs  Lab 06/15/23 1935  BNP 244.0*     Radiology/Studies:  DG Chest 2 View Result Date: 06/15/2023 CLINICAL DATA:  Shortness of breath. Congestion. History of congestive heart failure. Bloated feeling and short of breath. EXAM: CHEST - 2 VIEW COMPARISON:  04/28/2017 FINDINGS: Cardiac enlargement with central pulmonary vascular congestion and bilateral perihilar infiltrates consistent with edema. No pleural effusion or pneumothorax. Mediastinal contours appear intact. Degenerative changes in the spine. IMPRESSION: Cardiac enlargement with pulmonary vascular congestion and perihilar infiltrates, likely edema. Electronically Signed   By: Burman Nieves M.D.   On: 06/15/2023 19:13    Assessment and Plan:   1. Acute on chronic HFrEF, hemodynamics worrisome for borderline low output, low/flat elevated troponin - hold home dose carvedilol, Entresto, Jardiance, and oral Lasix, pending MD review - hold IV solu-medrol as I suspect less of a respiratory infection issue and more HF, steroids can drive more fluid retention/tachy - obtain echocardiogram - would not be surprised if LV dysfunction has worsened further - f/u BMET, Mg, lactic acid, LFTs ASAP - UDS for completeness - will review with Dr. Diona Browner and communicate with HF team at [facility] regarding strategy - may eventually benefit from formal R/LHC to definitively exclude presence/absence of coronary disease  2. New onset AF RVR - HR improved with diltiazem drip but not ideal as a long term strategy given LV dysfunction,  hold BB for now - anticipate early rhythm control strategy this admission given severe heart failure (I.e. TEE DCCV if he does not convert) - written for Lovenox per pharmacy for anticoagulation for now, I will change to heparin per pharmacy going forward to allow for procedures if needed - per d/w Dr. Diona Browner, start IV amiodarone bolus and drip for rate control -> understanding risk of conversion to NSR in absence of anticoagulation but benefit felt outweighs risk - TSH pending  3. AKI - Cr 1.41,  previously 1.14, question cardiorenal  - follow  4. Morbid obesity with untreated OSA - will need to consider GLP1/weight management as OP and revisit other options for OSA therapy  5. DM2 - per primary  6. H/o pulm nodule, mild aneurysmal dilation of aorta previously seen on CT imaging  - can f/u non-emergent CT when more stable  7. Frequent PVCs - TSH pending - Recheck BMET, Mg now and replete if needed - starting amio above  Risk Assessment/Risk Scores:     New York Heart Association (NYHA) Functional Class NYHA Class IV on arrival   CHA2DS2-VASc Score = 2   This indicates a 2.2% annual risk of stroke. The patient's score is based upon: CHF History: 1 HTN History: 1 Diabetes History: 0 Stroke History: 0 Vascular Disease History: 0 Age Score: 0 Gender Score: 0     For questions or updates, please contact  [facility] Please consult [email or web address] for contact info under    Signed, Laurann Montana, PA-C  06/16/2023 9:45 AM   Attending note:  Patient seen and examined, I reviewed his records and discussed the case with Ms. Jetta Lout, I agree with her above findings.  Mr. Bushey presents with history of HFrEF and presumed nonischemic cardiomyopathy, LVEF 20 to 25% with global hypokinesis by echocardiogram in 2023, RV contraction normal at that time.  He has not had consistent cardiology follow-up nor use of medications, although states that since his last office  encounter in November 2024 he has been taking Coreg, [place], [place], and Lasix.  He was scheduled to have a follow-up outpatient echocardiogram in March with continued management from there.  Presents now to the [facility] ER complaining of worsening shortness of breath, abdominal distention, vague sense of palpitations that began about a week ago.  His family had experienced viral URI prior to the onset of the symptoms, his kids got sick first, then he and his wife.  Tested negative for viral respiratory panel at urgent care and was treated with an empiric course of antibiotics with no improvement in symptoms.  No recent fevers or cough.  He is noted to be in rapid atrial fibrillation which is a new diagnosis on assessment in the ER.  Started initially on intravenous diltiazem by ER staff and also given dose of Lasix, systolic has been soft and he is not urinating a substantial amount.  Creatinine 1.41, BNP 244, high-sensitivity troponin I levels not diagnostic for ACS.  Chest x-ray shows cardiomegaly with vascular congestion and possible perihilar infiltrate.  On examination he is sitting up in bed, mildly short of breath with longer sentences, denies any chest pain.  Complains that his legs have been swelling within the last 12 hours, abdomen somewhat less tight.  He is afebrile, heart rate 100-115 in atrial fibrillation by telemetry which I personally reviewed.  Recent systolics 90s to low 100s.  Lungs exhibit decreased breath sounds with scattered rhonchi and no wheezing, cardiac exam with indistinct PMI and irregularly irregular rhythm, no obvious gallop.  Abdomen is distended and he has peripheral edema evident.  Difficult to assess JVP.  Pertinent lab work includes TSH 3.58, UDS negative, potassium 3.3, creatinine 1.41 with GFR greater than 60, WBC 8.1, hemoglobin 12.5, platelets 187, high-sensitivity troponin I levels flat in the 40s.  ECG shows atrial fibrillation with RVR, IVCD, decreased R  wave progression, diffuse ST-T wave abnormalities.  Mr. Geers presents with acute on chronic HFrEF in the setting of recent viral URI and newly  diagnosed atrial fibrillation with RVR which has possibly been present at least over the last week.  He has evidence of fluid overload and concern for low output.  Would hold outpatient doses of Coreg, Entresto, Lasix, and Jardiance.  Start IV amiodarone and wean off IV diltiazem.  Stop Solu-Medrol.  Check lactate and LFTs.  Plan to communicate with the heart failure team at [facility] as I suspect admission to that facility would make the most sense for further management.  Start IV heparin instead of Lovenox to better facilitate procedures.  May need to have PICC line for co-ox in the short-term to help guide need for inotropic support as rhythm and volume status stabilize. Right and left heart catheterization can also be considered depending on his clinical course.  Obtain follow-up echocardiogram.  Most likely early restoration of sinus rhythm would have the most clinical benefit, TEE cardioversion would be necessary, not entirely clear that he would tolerate that at present time however.  Jonelle Sidle, M.D., F.A.C.C.

## 2023-06-16 NOTE — Progress Notes (Signed)
 PHARMACY - ANTICOAGULATION CONSULT NOTE  Pharmacy Consult for heparin Indication: atrial fibrillation  Allergies  Allergen Reactions   Codeine Other (See Comments)    Severe headache Pt reports being able to take percocet & vicodin    Patient Measurements: Height: 6\' 2"  (188 cm) Weight: (!) 170.1 kg (375 lb) IBW/kg (Calculated) : 82.2 Heparin Dosing Weight: 123 kg  Vital Signs: Temp: 97.5 F (36.4 C) (02/26 0937) Temp Source: Oral (02/26 0937) BP: 129/84 (02/26 1047) Pulse Rate: 86 (02/26 1047)  Labs: Recent Labs    06/15/23 1935 06/15/23 2109 06/16/23 0744  HGB 12.5*  --   --   HCT 39.7  --   --   PLT 187  --   --   CREATININE 1.41*  --   --   TROPONINIHS 46* 49* 48*    Estimated Creatinine Clearance: 106.4 mL/min (A) (by C-G formula based on SCr of 1.41 mg/dL (H)).   Medical History: Past Medical History:  Diagnosis Date   Cardiomyopathy (HCC)    Cholelithiasis    Chronic HFrEF (heart failure with reduced ejection fraction) (HCC)    Diverticulosis    Hepatic steatosis    Hypertension    Morbid obesity (HCC)    OSA (obstructive sleep apnea)    Pneumonia    Pulmonary nodule     Medications:  (Not in a hospital admission)   Assessment: New onset of atrial fibrillation w/RVR, transitioning from lovenox to heparin to allow for procedures if necessary from cardiology. Not on anticoagulation prior to admission.   Goal of Therapy:  Heparin level 0.3-0.7 units/ml Monitor platelets by anticoagulation protocol: Yes   Plan:  D/C Lovenox  Start heparin at 9:41 pm Start heparin infusion at 1750 units/hr Check anti-Xa level in 6-8 hours and daily while on heparin Continue to monitor H&H and platelets  [employer] 06/16/2023,11:13 AM

## 2023-06-16 NOTE — Progress Notes (Signed)
 PROGRESS NOTE    Patient: Cole Singleton                            PCP: Patient, [name]                    DOB: 12/14/1975            DOA: 06/15/2023 ZOX:[id number]             DOS: 06/16/2023, 10:40 AM   LOS: 0 days   Date of Service: The patient was seen and examined on 06/16/2023  Subjective:   The patient was seen and examined this morning. Hemodynamically stable. No issues overnight .  Brief Narrative:   Cole Singleton is a 48 y.o. male with medical history significant of chronic HFrEF, hyperlipidemia who presents to the emergency department due to 8 to 10-day onset of increasing or worsening shortness of breath.  Patient complained of family having viral illness last week and is started to have sinus congestion, so he went to an urgent care where he was prescribed with antibiotics.  He complained of worsening shortness of breath when laying flat and has not been able to sleep well in the last 4 to 5 days.  Patient endorsed increased heart rates and weight gain.     ED Course:   Tachycardic, tachypneic, BP was 102/69 and other vital signs were within normal range.  Workup in the ED showed microcytic anemia, BMP was normal except for potassium of 3.3, blood glucose 104, BUN/creatinine 23/1.41 (baseline creatinine at 1.1). Chest x-ray showed cardiac enlargement with pulmonary vascular congestion and perihilar infiltrates, likely edema Patient was treated with IV Lasix 40 mg x 1, IV Lopressor 5 mg x 1 was given due to atrial fibrillation without much effect, he was then started on IV Cardizem drip.  Hospitalist was asked to admit patient for further evaluation and management.      Assessment & Plan:   Principal Problem:   Acute exacerbation of CHF (congestive heart failure) (HCC) Active Problems:   Microcytic anemia   Hypokalemia   Elevated brain natriuretic peptide (BNP) level   Paroxysmal atrial fibrillation with RVR (HCC)   Elevated troponin   AKI (acute kidney  injury) (HCC)   Obesity, Class III, BMI 40-49.9 (morbid obesity) (HCC)   Cardiomyopathy (HCC)  Assessment/Plan :  Acute on chronic HFrEF -Monitoring daily weight, I's and O's, fluid restriction -Holding IV Lasix due to soft BP  Continue heart healthy diet      Echocardiogram done on 11/16/2021 showed LVEF of 20 to 25%.  LV demonstrates global hypokinesis.   EP echocardiogram:     New onset persistent A-fib with RVR -Was on Cardizem drip, switched to amiodarone by cardiology -Lovenox switched to heparin drip -Cardiology consulted following closely CHA2DS2-VASc Score = 2   This indicates a 2.2% annual risk of stroke. The patient's score is based upon: CHF History: 1 HTN History: 1 Cardioversion and possible chronic anticoagulation per cardiology  Elevated BNP BNP 244 (chronically elevated), this was 209 about 6 years ago Continue to monitor BNP    Hypokalemia K+ 3.3, this will be replenished   Elevated troponin possibly due to type II demand ischemia Troponin 46 > 49; patient denies chest pain Continue to trend troponin   Microcytic anemia MCV 76.1, hemoglobin 12.5, iron studies:    Acute kidney injury BUN/creatinine 23/1.41 (baseline creatinine at 1.1) Renally adjust medications, avoid nephrotoxic  agents/dehydration/hypotension   Morbid obesity (BMI 48.15) Body mass index is 48.15 kg/m.  Patient was counseled about the cardiovascular and metabolic risk of morbid obesity. Patient was counseled for diet control, exercise regimen and weight loss.     ----------------------------------------------------------------------------------------------------------------------------------------------- Nutritional status:  The patient's BMI is: Body mass index is 48.15 kg/m. I agree with the assessment and plan as outlined--------------------------------------------------------------------------------------------------------  DVT prophylaxis:  SCDs Start: 06/16/23  0636   Code Status:   Code Status: Full Code  Family Communication: No family member present at bedside- -Advance care planning has been discussed.   Admission status:   Status is: Inpatient Remains inpatient appropriate because: Needing IV medication for rate control, anticoagulation   Disposition: From  - home             Planning for discharge in 1-2 days: to   Procedures:   No admission procedures for hospital encounter.   Antimicrobials:  Anti-infectives (From admission, onward)    None        Medication:   amiodarone  150 mg Intravenous Once   rosuvastatin  10 mg Oral Daily    acetaminophen **OR** acetaminophen, ondansetron **OR** ondansetron (ZOFRAN) IV   Objective:   Vitals:   06/16/23 0803 06/16/23 0804 06/16/23 0937 06/16/23 0937  BP: 91/72   109/85  Pulse: (!) 103   96  Resp: (!) 24   (!) 25  Temp:   (!) 97.5 F (36.4 C)   TempSrc:   Oral   SpO2:  94%  93%  Weight:      Height:        Intake/Output Summary (Last 24 hours) at 06/16/2023 1040 Last data filed at 06/16/2023 0415 Gross per 24 hour  Intake 29.9 ml  Output --  Net 29.9 ml   Filed Weights   06/15/23 1838  Weight: (!) 170.1 kg     Physical examination:   Constitution:  Alert, cooperative, no distress,  Appears calm and comfortable  Psychiatric:   Normal and stable mood and affect, cognition intact,   HEENT:        Normocephalic, PERRL, otherwise with in Normal limits  Chest:         Chest symmetric Cardio vascular: S1, S2, irregularly irregular,, No Rubs or Gallops  pulmonary: Clear to auscultation bilaterally, respirations unlabored, negative wheezes / crackles Abdomen: Soft, non-tender, non-distended, bowel sounds,no masses, no organomegaly Muscular skeletal: Limited exam - in bed, able to move all 4 extremities,   Neuro: CNII-XII intact. , normal motor and sensation, reflexes intact  Extremities: No pitting edema lower extremities, +2 pulses  Skin: Dry, warm to touch,  negative for any Rashes, No open wounds Wounds: per nursing documentation   ------------------------------------------------------------------------------------------------------------------------------------------    LABs:     Latest Ref Rng & Units 06/15/2023    7:35 PM 03/24/2023   12:37 PM 07/09/2020    9:06 PM  CBC  WBC 4.0 - 10.5 K/uL 8.1  7.3  4.1   Hemoglobin 13.0 - 17.0 g/dL 40.9  81.1  91.4   Hematocrit 39.0 - 52.0 % 39.7  47.7  44.3   Platelets 150 - 400 K/uL 187  242  281       Latest Ref Rng & Units 06/15/2023    7:35 PM 03/24/2023   12:36 PM 01/02/2022    2:00 PM  CMP  Glucose 70 - 99 mg/dL 782  956  213   BUN 6 - 20 mg/dL 23  12  12    Creatinine 0.61 -  1.24 mg/dL 7.84  6.96  2.95   Sodium 135 - 145 mmol/L 138  139  138   Potassium 3.5 - 5.1 mmol/L 3.3  4.5  3.9   Chloride 98 - 111 mmol/L 102  104  107   CO2 22 - 32 mmol/L 23  19  24    Calcium 8.9 - 10.3 mg/dL 9.2  9.0  9.2   Total Protein 6.0 - 8.5 g/dL  6.7    Total Bilirubin 0.0 - 1.2 mg/dL  0.6    Alkaline Phos 44 - 121 IU/L  93    AST 0 - 40 IU/L  18    ALT 0 - 44 IU/L  24         Micro Results No results found for this or any previous visit (from the past 240 hours).  Radiology Reports DG Chest 2 View Result Date: 06/15/2023 CLINICAL DATA:  Shortness of breath. Congestion. History of congestive heart failure. Bloated feeling and short of breath. EXAM: CHEST - 2 VIEW COMPARISON:  04/28/2017 FINDINGS: Cardiac enlargement with central pulmonary vascular congestion and bilateral perihilar infiltrates consistent with edema. No pleural effusion or pneumothorax. Mediastinal contours appear intact. Degenerative changes in the spine. IMPRESSION: Cardiac enlargement with pulmonary vascular congestion and perihilar infiltrates, likely edema. Electronically Signed   By: Burman Nieves M.D.   On: 06/15/2023 19:13    SIGNED: Kendell Bane, MD, FHM. FAAFP. [facility] - [facility] Time spent - 55 min.   In seeing, evaluating and examining the patient. Reviewing medical records, labs, drawn plan of care. [facility],  Pager (please use [email or web address] to page/ text) Please use Epic Secure Chat for non-urgent communication (7AM-7PM)  If 7PM-7AM, please contact night-coverage [email or web address], 06/16/2023, 10:40 AM

## 2023-06-16 NOTE — ED Notes (Signed)
[facility] called to transport patient. Nurse notified 

## 2023-06-16 NOTE — ED Provider Notes (Signed)
 [facility]-EMERGENCY DEPT [facility] Emergency Department Provider Note MRN:  [id number]  Arrival date & time: 06/16/23     Chief Complaint   Shortness of Breath   History of Present Illness   Cole Singleton is a 48 y.o. year-old male with a history of cardiomyopathy, CHF presenting to the ED with chief complaint of shortness of breath.  Patient explains that his whole family was having some type of viral illness last week.  Everyone else is recovering that he seems to be having continued issues with his shortness of breath, heart racing, malaise, fatigue, weight gain, shortness of breath when laying flat.  Denies chest pain.  Review of Systems  A thorough review of systems was obtained and all systems are negative except as noted in the HPI and PMH.   Patient's Health History    Past Medical History:  Diagnosis Date   Cardiomyopathy (HCC)    Cardiomyopathy (HCC)    CHF (congestive heart failure) (HCC)    Hypertension    Pneumonia     Past Surgical History:  Procedure Laterality Date   NO PAST SURGERIES      Family History  Problem Relation Age of Onset   Hypertension Mother    Hypertension Father     Social History   Socioeconomic History   Marital status: Married    Spouse name: Not on file   Number of children: Not on file   Years of education: Not on file   Highest education level: Not on file  Occupational History   Not on file  Tobacco Use   Smoking status: Never   Smokeless tobacco: Current    Types: Snuff  Vaping Use   Vaping status: Never Used  Substance and Sexual Activity   Alcohol use: Yes    Comment: occas   Drug use: No   Sexual activity: Not on file  Other Topics Concern   Not on file  Social History Narrative   Not on file   Social Drivers of Health   Financial Resource Strain: Not on file  Food Insecurity: Not on file  Transportation Needs: Not on file  Physical Activity: Not on file  Stress: Not on file  Social Connections: Not  on file  Intimate Partner Violence: Not on file     Physical Exam   Vitals:   06/16/23 0000 06/16/23 0050  BP: 102/69   Pulse: (!) 117 (!) 123  Resp: (!) 28 16  Temp:    SpO2: 92% 94%    CONSTITUTIONAL: Chronically-appearing, NAD NEURO/PSYCH:  Alert and oriented x 3, no focal deficits EYES:  eyes equal and reactive ENT/NECK:  no LAD, no JVD CARDIO: Tachycardic and irregular rate, well-perfused, normal S1 and S2 PULM:  CTAB no wheezing or rhonchi GI/GU:  non-distended, non-tender MSK/SPINE:  No gross deformities, bilateral lower extremity edema SKIN:  no rash, atraumatic   *Additional and/or pertinent findings included in MDM below  Diagnostic and Interventional Summary    EKG Interpretation Date/Time:  Tuesday June 15 2023 23:46:19 EST Ventricular Rate:  138 PR Interval:    QRS Duration:  115 QT Interval:  314 QTC Calculation: 476 R Axis:   101  Text Interpretation: Atrial fibrillation Nonspecific intraventricular conduction delay Abnormal T, consider ischemia, diffuse leads Confirmed by Kennis Carina [phone]) on 06/16/2023 12:10:03 AM       Labs Reviewed  BASIC METABOLIC PANEL - Abnormal; Notable for the following components:      Result Value   Potassium 3.3 (*)  Glucose, Bld 104 (*)    BUN 23 (*)    Creatinine, Ser 1.41 (*)    All other components within normal limits  CBC - Abnormal; Notable for the following components:   Hemoglobin 12.5 (*)    MCV 76.1 (*)    MCH 23.9 (*)    RDW 17.6 (*)    All other components within normal limits  TROPONIN I (HIGH SENSITIVITY) - Abnormal; Notable for the following components:   Troponin I (High Sensitivity) 46 (*)    All other components within normal limits  TROPONIN I (HIGH SENSITIVITY) - Abnormal; Notable for the following components:   Troponin I (High Sensitivity) 49 (*)    All other components within normal limits  BRAIN NATRIURETIC PEPTIDE  URINALYSIS, ROUTINE W REFLEX MICROSCOPIC    DG Chest 2 View   Final Result      Medications  furosemide (LASIX) injection 40 mg (40 mg Intravenous Given 06/16/23 0027)  metoprolol tartrate (LOPRESSOR) injection 5 mg (5 mg Intravenous Given 06/16/23 0027)     Procedures  /  Critical Care .Critical Care  Performed by: Sabas Sous, MD Authorized by: Sabas Sous, MD   Critical care provider statement:    Critical care time (minutes):  32   Critical care was necessary to treat or prevent imminent or life-threatening deterioration of the following conditions: A-fib with RVR.   Critical care was time spent personally by me on the following activities:  Development of treatment plan with patient or surrogate, discussions with consultants, evaluation of patient's response to treatment, examination of patient, ordering and review of laboratory studies, ordering and review of radiographic studies, ordering and performing treatments and interventions, pulse oximetry, re-evaluation of patient's condition and review of old charts   ED Course and Medical Decision Making  Initial Impression and Ddx Patient seems fluid overloaded on exam with lower extremity edema, patient is tachypneic, also in A-fib with RVR.  With orthopnea there is suspicion for pulmonary edema.  Not having any chest pain.  Past medical/surgical history that increases complexity of ED encounter: CHF  Interpretation of Diagnostics I personally reviewed the EKG and my interpretation is as follows: A-fib with RVR  Labs reveal mildly elevated troponins  Patient Reassessment and Ultimate Disposition/Management     Plan is for hospitalist admission.  Providing rate control, Lasix.  Patient management required discussion with the following services or consulting groups:  Hospitalist Service  Complexity of Problems Addressed Acute illness or injury that poses threat of life of bodily function  Additional Data Reviewed and Analyzed Further history obtained from: Prior labs/imaging  results  Additional Factors Impacting ED Encounter Risk Consideration of hospitalization  Elmer Sow. Pilar Plate, MD [facility] Emergency Medicine [facility] [email or web address]  Final Clinical Impressions(s) / ED Diagnoses     ICD-10-CM   1. Congestive heart failure, unspecified HF chronicity, unspecified heart failure type (HCC)  I50.9     2. Atrial fibrillation with rapid ventricular response (HCC)  I48.91       ED Discharge Orders     None        Discharge Instructions Discussed with and Provided to Patient:   Discharge Instructions   None      Sabas Sous, MD 06/16/23 [phone]

## 2023-06-16 NOTE — H&P (Signed)
 History and Physical    Patient: Cole Singleton GNF:[id number] DOB: 03-16-1976 DOA: 06/15/2023 DOS: the patient was seen and examined on 06/16/2023 PCP: Patient, [name]  Patient coming from: Home  Chief Complaint:  Chief Complaint  Patient presents with   Shortness of Breath   HPI: Cole Singleton is a 48 y.o. male with medical history significant of chronic HFrEF, hyperlipidemia who presents to the emergency department due to 8 to 10-day onset of increasing or worsening shortness of breath.  Patient complained of usual family having viral illness last week and is started to have sinus congestion, so he went to an urgent care where he was prescribed with antibiotics.  Everyone in the family started to recover from the illness, but he continued to have shortness of breath and increased abdominal girth which he thought was due to abdominal bloating and not fluid.  He complained of worsening shortness of breath when laying flat and has not been able to sleep well in the last 4 to 5 days.  Patient endorsed increased heart rates and weight gain.   ED Course:  In the emergency department, patient was tachycardic, tachypneic, BP was 102/69 and other vital signs were within normal range.  Workup in the ED showed microcytic anemia, BMP was normal except for potassium of 3.3, blood glucose 104, BUN/creatinine 23/1.41 (baseline creatinine at 1.1). Chest x-ray showed cardiac enlargement with pulmonary vascular congestion and perihilar infiltrates, likely edema Patient was treated with IV Lasix 40 mg x 1, IV Lopressor 5 mg x 1 was given due to atrial fibrillation without much effect, he was then started on IV Cardizem drip.  Hospitalist was asked to admit patient for further evaluation and management.  Review of Systems: Review of systems as noted in the HPI. All other systems reviewed and are negative.   Past Medical History:  Diagnosis Date   Cardiomyopathy (HCC)    Cardiomyopathy (HCC)     CHF (congestive heart failure) (HCC)    Hypertension    Pneumonia    Past Surgical History:  Procedure Laterality Date   NO PAST SURGERIES      Social History:  reports that he has never smoked. His smokeless tobacco use includes snuff. He reports current alcohol use. He reports that he does not use drugs.   Allergies  Allergen Reactions   Codeine Other (See Comments)    Severe headache Pt reports being able to take percocet & vicodin    Family History  Problem Relation Age of Onset   Hypertension Mother    Hypertension Father      Prior to Admission medications   Medication Sig Start Date End Date Taking? Authorizing Provider  albuterol (VENTOLIN HFA) 108 (90 Base) MCG/ACT inhaler SMARTSIG:2 Puff(s) By Mouth Every 6 Hours PRN 01/16/22   [provider]  carvedilol (COREG) 6.25 MG tablet Take 1 tablet (6.25 mg total) by mouth 2 (two) times daily. 03/17/23   Sharlene Dory, NP  empagliflozin (JARDIANCE) 10 MG TABS tablet Take 1 tablet (10 mg total) by mouth daily before breakfast. 03/17/23   Sharlene Dory, NP  furosemide (LASIX) 40 MG tablet Take 1 tablet (40 mg total) by mouth as needed for fluid or edema. 03/17/23   Sharlene Dory, NP  potassium chloride (KLOR-CON) 10 MEQ tablet Take 1 tablet (10 mEq total) by mouth daily. 03/17/23 03/11/24  Sharlene Dory, NP  rosuvastatin (CRESTOR) 10 MG tablet Take 1 tablet (10 mg total) by mouth daily. 04/08/23 07/07/23  Sharlene Dory, NP  sacubitril-valsartan (ENTRESTO) 49-51 MG Take 1 tablet by mouth twice daily 03/31/23   Sharlene Dory, NP    Physical Exam: BP 92/67   Pulse 91   Temp 98.3 F (36.8 C) (Oral)   Resp 19   Ht 6\' 2"  (1.88 m)   Wt (!) 170.1 kg   SpO2 93%   BMI 48.15 kg/m   General: 48 y.o. year-old male well developed well nourished in no acute distress.  Alert and oriented x3. HEENT: NCAT, EOMI Neck: Supple, trachea medial Cardiovascular: Tachycardia, irregular rate and rhythm with no rubs or  gallops.  No thyromegaly or JVD noted.  No lower extremity edema. 2/4 pulses in all 4 extremities. Respiratory: Tachypnea, bilateral Rales in lower lobes on auscultation.  Abdomen: Soft, nontender nondistended with normal bowel sounds x4 quadrants. Muskuloskeletal: No cyanosis, clubbing or edema noted bilaterally Neuro: CN II-XII intact, strength 5/5 x 4, sensation, reflexes intact Skin: No ulcerative lesions noted or rashes Psychiatry: Judgement and insight appear normal. Mood is appropriate for condition and setting          Labs on Admission:  Basic Metabolic Panel: Recent Labs  Lab 06/15/23 1935  NA 138  K 3.3*  CL 102  CO2 23  GLUCOSE 104*  BUN 23*  CREATININE 1.41*  CALCIUM 9.2   Liver Function Tests: No results for input(s): "AST", "ALT", "ALKPHOS", "BILITOT", "PROT", "ALBUMIN" in the last 168 hours. No results for input(s): "LIPASE", "AMYLASE" in the last 168 hours. No results for input(s): "AMMONIA" in the last 168 hours. CBC: Recent Labs  Lab 06/15/23 1935  WBC 8.1  HGB 12.5*  HCT 39.7  MCV 76.1*  PLT 187   Cardiac Enzymes: No results for input(s): "CKTOTAL", "CKMB", "CKMBINDEX", "TROPONINI" in the last 168 hours.  BNP (last 3 results) Recent Labs    06/15/23 1935  BNP 244.0*    ProBNP (last 3 results) No results for input(s): "PROBNP" in the last 8760 hours.  CBG: No results for input(s): "GLUCAP" in the last 168 hours.  Radiological Exams on Admission: DG Chest 2 View Result Date: 06/15/2023 CLINICAL DATA:  Shortness of breath. Congestion. History of congestive heart failure. Bloated feeling and short of breath. EXAM: CHEST - 2 VIEW COMPARISON:  04/28/2017 FINDINGS: Cardiac enlargement with central pulmonary vascular congestion and bilateral perihilar infiltrates consistent with edema. No pleural effusion or pneumothorax. Mediastinal contours appear intact. Degenerative changes in the spine. IMPRESSION: Cardiac enlargement with pulmonary vascular  congestion and perihilar infiltrates, likely edema. Electronically Signed   By: Burman Nieves M.D.   On: 06/15/2023 19:13    EKG: I independently viewed the EKG done and my findings are as followed: Atrial fibrillation with RVR  Assessment/Plan Present on Admission:  Hypokalemia  Microcytic anemia  Principal Problem:   Acute exacerbation of CHF (congestive heart failure) (HCC) Active Problems:   Microcytic anemia   Hypokalemia   Elevated brain natriuretic peptide (BNP) level   Paroxysmal atrial fibrillation with RVR (HCC)   Elevated troponin   AKI (acute kidney injury) (HCC)   Obesity, Class III, BMI 40-49.9 (morbid obesity) (HCC)  Acute on chronic HFrEF Continue total input/output, daily weights and fluid restriction IV Lasix will be temporarily held at this time due to soft BP Continue heart healthy diet  Echocardiogram done on 11/16/2021 showed LVEF of 20 to 25%.  LV demonstrates global hypokinesis.  LV diastolic parameters were normal.  Echocardiogram in the morning   Elevated BNP BNP 244 (chronically elevated), this was 209 about 6  years ago Continue to monitor BNP  Paroxysmal atrial fibrillation with RVR Continue IV Cardizem drip with plan to transition to DOAC once HR is better controlled Therapeutic Lovenox given with plan to transition to DOAC prior to discharge  Hypokalemia K+ 3.3, this will be replenished  Elevated troponin possibly due to type II demand ischemia Troponin 46 > 49; patient denies chest pain Continue to trend troponin  Microcytic anemia MCV 76.1, hemoglobin 12.5, iron studies will be done  Acute kidney injury BUN/creatinine 23/1.41 (baseline creatinine at 1.1) Renally adjust medications, avoid nephrotoxic agents/dehydration/hypotension  Morbid obesity (BMI 48.15) Patient was counseled about the cardiovascular and metabolic risk of morbid obesity. Patient was counseled for diet control, exercise regimen and weight loss.   Advance Care  Planning: Full code  Consults: None  Severity of Illness: The appropriate patient status for this patient is INPATIENT. Inpatient status is judged to be reasonable and necessary in order to provide the required intensity of service to ensure the patient's safety. The patient's presenting symptoms, physical exam findings, and initial radiographic and laboratory data in the context of their chronic comorbidities is felt to place them at high risk for further clinical deterioration. Furthermore, it is not anticipated that the patient will be medically stable for discharge from the hospital within 2 midnights of admission.   * I certify that at the point of admission it is my clinical judgment that the patient will require inpatient hospital care spanning beyond 2 midnights from the point of admission due to high intensity of service, high risk for further deterioration and high frequency of surveillance required.*  Author: Frankey Shown, DO 06/16/2023 7:28 AM  For on call review [email or web address].

## 2023-06-16 NOTE — Progress Notes (Addendum)
 PHARMACY - ANTICOAGULATION CONSULT NOTE  Pharmacy Consult for Lovenox Indication: atrial fibrillation  Allergies  Allergen Reactions   Codeine Other (See Comments)    Severe headache Pt reports being able to take percocet & vicodin    Patient Measurements: Height: 6\' 2"  (188 cm) Weight: (!) 170.1 kg (375 lb) IBW/kg (Calculated) : 82.2 Heparin Dosing Weight: 123 kg  Vital Signs: BP: 92/67 (02/26 0611) Pulse Rate: 91 (02/26 0640)  Labs: Recent Labs    06/15/23 1935 06/15/23 2109  HGB 12.5*  --   HCT 39.7  --   PLT 187  --   CREATININE 1.41*  --   TROPONINIHS 46* 49*    Estimated Creatinine Clearance: 106.4 mL/min (A) (by C-G formula based on SCr of 1.41 mg/dL (H)).   Medical History: Past Medical History:  Diagnosis Date   Cardiomyopathy (HCC)    Cardiomyopathy (HCC)    CHF (congestive heart failure) (HCC)    Hypertension    Pneumonia     Assessment: 48 YO male presenting with CHF exacerbation and atrial fibrillation w/ RVR. No AC noted prior to admission.   Today, 06/16/23: Hgb 12.5, plts 187--stable Scr 1.41 (calculated CrCl based on AdjBW is ~50ml/min) No s/sx of bleeding reported No upcoming procedures, per MD  Goal of Therapy:  Monitor platelets by anticoagulation protocol: Yes   Plan:  Start Lovenox 1mg /kg [place] q12hrs  Check CBC q72hrs  Monitor for s/sx of bleeding daily F/u long-term AC plan Monitor for changes in renal function   Cherylin Mylar, PharmD Clinical Pharmacist  2/26/20257:03 AM

## 2023-06-16 NOTE — ED Notes (Addendum)
 Pt went to bathroom via w/c due to tachypnea. Pt sats upon arrival back to room 94%ra and hr 106. Will drop cardizem to 5mg /hr per vo Dr Diona Browner, read back and verified.

## 2023-06-16 NOTE — Discharge Instructions (Signed)

## 2023-06-16 NOTE — Progress Notes (Signed)
 [place] sent to primary RN regarding PICC placement. Informed there is not a PICC nurse tonight, and it will be placed tomorrow, 06/17/23. Currently has 2 functioning PIV's.

## 2023-06-17 ENCOUNTER — Other Ambulatory Visit (HOSPITAL_COMMUNITY): Payer: Self-pay

## 2023-06-17 ENCOUNTER — Inpatient Hospital Stay (HOSPITAL_COMMUNITY): Payer: Medicaid Other

## 2023-06-17 DIAGNOSIS — I5023 Acute on chronic systolic (congestive) heart failure: Secondary | ICD-10-CM | POA: Diagnosis not present

## 2023-06-17 LAB — COMPREHENSIVE METABOLIC PANEL
ALT: 39 U/L (ref 0–44)
AST: 28 U/L (ref 15–41)
Albumin: 3.6 g/dL (ref 3.5–5.0)
Alkaline Phosphatase: 57 U/L (ref 38–126)
Anion gap: 16 — ABNORMAL HIGH (ref 5–15)
BUN: 31 mg/dL — ABNORMAL HIGH (ref 6–20)
CO2: 19 mmol/L — ABNORMAL LOW (ref 22–32)
Calcium: 9 mg/dL (ref 8.9–10.3)
Chloride: 99 mmol/L (ref 98–111)
Creatinine, Ser: 1.87 mg/dL — ABNORMAL HIGH (ref 0.61–1.24)
GFR, Estimated: 44 mL/min — ABNORMAL LOW (ref >60)
Glucose, Bld: 166 mg/dL — ABNORMAL HIGH (ref 70–99)
Potassium: 3.6 mmol/L (ref 3.5–5.1)
Sodium: 134 mmol/L — ABNORMAL LOW (ref 135–145)
Total Bilirubin: 1.1 mg/dL (ref 0.0–1.2)
Total Protein: 7.1 g/dL (ref 6.5–8.1)

## 2023-06-17 LAB — BASIC METABOLIC PANEL
Anion gap: 14 (ref 5–15)
BUN: 33 mg/dL — ABNORMAL HIGH (ref 6–20)
CO2: 23 mmol/L (ref 22–32)
Calcium: 8.5 mg/dL — ABNORMAL LOW (ref 8.9–10.3)
Chloride: 95 mmol/L — ABNORMAL LOW (ref 98–111)
Creatinine, Ser: 2.02 mg/dL — ABNORMAL HIGH (ref 0.61–1.24)
GFR, Estimated: 40 mL/min — ABNORMAL LOW (ref >60)
Glucose, Bld: 293 mg/dL — ABNORMAL HIGH (ref 70–99)
Potassium: 3.3 mmol/L — ABNORMAL LOW (ref 3.5–5.1)
Sodium: 132 mmol/L — ABNORMAL LOW (ref 135–145)

## 2023-06-17 LAB — COOXEMETRY PANEL
Carboxyhemoglobin: 2.1 % — ABNORMAL HIGH (ref 0.5–1.5)
Carboxyhemoglobin: 2.3 % — ABNORMAL HIGH (ref 0.5–1.5)
Methemoglobin: 0.7 % (ref 0.0–1.5)
Methemoglobin: <0.7 % (ref 0.0–1.5)
O2 Saturation: 93.3 %
O2 Saturation: 76.4 %
Total hemoglobin: 12.6 g/dL (ref 12.0–16.0)
Total hemoglobin: 11.9 g/dL — ABNORMAL LOW (ref 12.0–16.0)

## 2023-06-17 LAB — CBC
HCT: 38.3 % — ABNORMAL LOW (ref 39.0–52.0)
Hemoglobin: 12.1 g/dL — ABNORMAL LOW (ref 13.0–17.0)
MCH: 23.9 pg — ABNORMAL LOW (ref 26.0–34.0)
MCHC: 31.6 g/dL (ref 30.0–36.0)
MCV: 75.5 fL — ABNORMAL LOW (ref 80.0–100.0)
Platelets: 283 10*3/uL (ref 150–400)
RBC: 5.07 MIL/uL (ref 4.22–5.81)
RDW: 17.7 % — ABNORMAL HIGH (ref 11.5–15.5)
WBC: 13.6 10*3/uL — ABNORMAL HIGH (ref 4.0–10.5)
nRBC: 0 % (ref 0.0–0.2)

## 2023-06-17 LAB — LIPID PANEL
Cholesterol: 161 mg/dL (ref 0–200)
HDL: 30 mg/dL — ABNORMAL LOW (ref >40)
LDL Cholesterol: 111 mg/dL — ABNORMAL HIGH (ref 0–99)
Total CHOL/HDL Ratio: 5.4 ratio
Triglycerides: 99 mg/dL (ref <150)
VLDL: 20 mg/dL (ref 0–40)

## 2023-06-17 LAB — HEPARIN LEVEL (UNFRACTIONATED)
Heparin Unfractionated: 0.51 [IU]/mL (ref 0.30–0.70)
Heparin Unfractionated: 0.43 [IU]/mL (ref 0.30–0.70)

## 2023-06-17 LAB — BRAIN NATRIURETIC PEPTIDE: B Natriuretic Peptide: 229.7 pg/mL — ABNORMAL HIGH (ref 0.0–100.0)

## 2023-06-17 LAB — PHOSPHORUS: Phosphorus: 3.7 mg/dL (ref 2.5–4.6)

## 2023-06-17 LAB — MAGNESIUM: Magnesium: 1.9 mg/dL (ref 1.7–2.4)

## 2023-06-17 LAB — PROCALCITONIN: Procalcitonin: <0.1 ng/mL

## 2023-06-17 MED ORDER — MAGNESIUM SULFATE 2 GM/50ML IV SOLN
2.0000 g | Freq: Once | INTRAVENOUS | Status: AC
  Administered 2023-06-17: 2 g via INTRAVENOUS
  Filled 2023-06-17: qty 50

## 2023-06-17 MED ORDER — ALUM & MAG HYDROXIDE-SIMETH 200-200-20 MG/5ML PO SUSP
30.0000 mL | Freq: Four times a day (QID) | ORAL | Status: DC | PRN
  Administered 2023-06-18 (×2): 30 mL via ORAL
  Filled 2023-06-17 (×2): qty 30

## 2023-06-17 MED ORDER — CHLORHEXIDINE GLUCONATE CLOTH 2 % EX PADS
6.0000 | MEDICATED_PAD | Freq: Every day | CUTANEOUS | Status: DC
  Administered 2023-06-17 – 2023-06-22 (×6): 6 via TOPICAL

## 2023-06-17 MED ORDER — POTASSIUM CHLORIDE CRYS ER 20 MEQ PO TBCR
40.0000 meq | EXTENDED_RELEASE_TABLET | Freq: Once | ORAL | Status: AC
  Administered 2023-06-17: 40 meq via ORAL
  Filled 2023-06-17: qty 2

## 2023-06-17 MED ORDER — FUROSEMIDE 10 MG/ML IJ SOLN
80.0000 mg | Freq: Once | INTRAMUSCULAR | Status: AC
  Administered 2023-06-17: 80 mg via INTRAVENOUS
  Filled 2023-06-17: qty 8

## 2023-06-17 MED ORDER — FUROSEMIDE 10 MG/ML IJ SOLN
30.0000 mg/h | INTRAVENOUS | Status: AC
  Administered 2023-06-17 – 2023-06-19 (×8): 30 mg/h via INTRAVENOUS
  Filled 2023-06-17 (×8): qty 20

## 2023-06-17 MED ORDER — SACUBITRIL-VALSARTAN 24-26 MG PO TABS
1.0000 | ORAL_TABLET | Freq: Two times a day (BID) | ORAL | Status: DC
  Administered 2023-06-17 – 2023-06-22 (×11): 1 via ORAL
  Filled 2023-06-17 (×11): qty 1

## 2023-06-17 MED ORDER — METOLAZONE 2.5 MG PO TABS
2.5000 mg | ORAL_TABLET | Freq: Once | ORAL | Status: AC
  Administered 2023-06-17: 2.5 mg via ORAL
  Filled 2023-06-17: qty 1

## 2023-06-17 MED ORDER — POTASSIUM CHLORIDE CRYS ER 20 MEQ PO TBCR
40.0000 meq | EXTENDED_RELEASE_TABLET | ORAL | Status: AC
  Administered 2023-06-17 (×3): 40 meq via ORAL
  Filled 2023-06-17 (×3): qty 2

## 2023-06-17 MED ORDER — SODIUM CHLORIDE 0.9% FLUSH
10.0000 mL | Freq: Two times a day (BID) | INTRAVENOUS | Status: DC
  Administered 2023-06-17 – 2023-06-22 (×11): 10 mL

## 2023-06-17 MED ORDER — SODIUM CHLORIDE 0.9% FLUSH: 10.0000 mL | INTRAVENOUS | Status: DC | PRN

## 2023-06-17 MED ORDER — IRON SUCROSE 200 MG IVPB - SIMPLE MED
200.0000 mg | Status: DC
  Administered 2023-06-17: 200 mg via INTRAVENOUS
  Filled 2023-06-17: qty 200
  Filled 2023-06-17: qty 110

## 2023-06-17 NOTE — Plan of Care (Signed)

## 2023-06-17 NOTE — Progress Notes (Signed)
 Patient started on Amio gtt, milrinone gtt, lasix and heparin gtt   06/16/23 1958  Assess: MEWS Score  Temp 97.9 F (36.6 C)  BP (!) 141/99  MAP (mmHg) 111  Pulse Rate 100  ECG Heart Rate (!) 122  Resp 19  Level of Consciousness Alert  SpO2 96 %  O2 Device Nasal Cannula  Patient Activity (if Appropriate) In bed  O2 Flow Rate (L/min) 2 L/min  Assess: MEWS Score  MEWS Temp 0  MEWS Systolic 0  MEWS Pulse 2  MEWS RR 0  MEWS LOC 0  MEWS Score 2  MEWS Score Color Yellow  Assess: if the MEWS score is Yellow or Red  Were vital signs accurate and taken at a resting state? Yes  Does the patient meet 2 or more of the SIRS criteria? No  MEWS guidelines implemented  Yes, yellow  Treat  MEWS Interventions Considered administering scheduled or prn medications/treatments as ordered  Take Vital Signs  Increase Vital Sign Frequency  Yellow: Q2hr x1, continue Q4hrs until patient remains green for 12hrs  Escalate  MEWS: Escalate Yellow: Discuss with charge nurse and consider notifying provider and/or RRT  Notify: Charge Nurse/RN  Name of Charge Nurse/RN Notified Tanya D, RN  Assess: SIRS CRITERIA  SIRS Temperature  0  SIRS Respirations  0  SIRS Pulse 1  SIRS WBC 0  SIRS Score Sum  1

## 2023-06-17 NOTE — Progress Notes (Signed)
 PROGRESS NOTE    Cole Singleton  ZOX:[id number] DOB: [birth year] DOA: 06/15/2023 PCP: Patient, [name]  48/M with morbid obesity, chronic systolic CHF, OSA, hypertension, fatty liver disease, admitted to [facility] with volume overload and AFib RVR -2D echo noted EF of 20%, severely reduced RV, transferred to [facility] -2/26 started empiric milrinone and Lasix gtt. and amiodarone gtt   Subjective: Feels better, swelling is starting to improve  Assessment and Plan:  Acute on chronic systolic CHF, BiV failure -Previously with chronic systolic CHF, EF 81%, presumed NICM -Echo now with EF 20%, severely reduced RV -Suspected to be low output, advanced heart failure team following -On Lasix gtt., 20 Mg per hour and milrinone -Monitor with diuresis -Out of bed to chair  A-fib RVR -Remains tachycardic, currently on Amio gtt. and heparin gtt. -Plan for cardioversion once volume status has improved  AKI -Suspected to be cardiorenal, creatinine up to 1.87, continue milrinone and Lasix as above  Severe OSA, obesity BMI is 50.4 -Was unable to tolerate CPAP in the past -Recommended repeat sleep study and compliance with CPAP -Weight loss and lifestyle modification recommended  Mild leukocytosis -Possibly reactive, monitor  Severe fatty liver disease -Albumin is 3.6 now  DVT prophylaxis: Code Status:  Family Communication: Disposition Plan:   Consultants:    Procedures:   Antimicrobials:    Objective: Vitals:   06/16/23 1958 06/16/23 2321 06/17/23 0646 06/17/23 0859  BP: (!) 141/99 (!) 116/92 121/85 (!) 137/107  Pulse: 100 (!) 112 (!) 103   Resp: 19 (!) 25 20 (!) 22  Temp: 97.9 F (36.6 C) 98 F (36.7 C) 97.8 F (36.6 C) 97.8 F (36.6 C)  TempSrc: Oral Oral Oral Oral  SpO2: 96% 92% 100% 93%  Weight:   (!) 178.2 kg   Height:        Intake/Output Summary (Last 24 hours) at 06/17/2023 1010 Last data filed at 06/17/2023 1005 Gross per 24 hour  Intake  1647.32 ml  Output 2875 ml  Net -1227.68 ml   Filed Weights   06/15/23 1838 06/17/23 0646  Weight: (!) 170.1 kg (!) 178.2 kg    Examination:  General exam: Appears calm and comfortable obese chronically ill male HEENT: Positive JVD Respiratory system: Decreased breath sounds at the bases Cardiovascular system: S1 & S2 heard, irregular Abd: nondistended, soft and nontender.Normal bowel sounds heard. Central nervous system: Alert and oriented. No focal neurological deficits. Extremities: 2+ edema Skin: Multiple maculopapular lesions since scars throughout lower legs Psychiatry:  Mood & affect appropriate.     Data Reviewed:   CBC: Recent Labs  Lab 06/15/23 1935 06/17/23 0226  WBC 8.1 13.6*  HGB 12.5* 12.1*  HCT 39.7 38.3*  MCV 76.1* 75.5*  PLT 187 283   Basic Metabolic Panel: Recent Labs  Lab 06/15/23 1935 06/16/23 1106 06/17/23 0226  NA 138 132* 134*  K 3.3* 3.9 3.6  CL 102 99 99  CO2 23 20* 19*  GLUCOSE 104* 148* 166*  BUN 23* 28* 31*  CREATININE 1.41* 1.81* 1.87*  CALCIUM 9.2 9.1 9.0  MG  --  2.1 1.9  PHOS  --   --  3.7   GFR: Estimated Creatinine Clearance: 82.4 mL/min (A) (by C-G formula based on SCr of 1.87 mg/dL (H)). Liver Function Tests: Recent Labs  Lab 06/16/23 1106 06/17/23 0226  AST 27 28  ALT 41 39  ALKPHOS 60 57  BILITOT 1.7* 1.1  PROT 7.6 7.1  ALBUMIN 3.8 3.6   No  results for input(s): "LIPASE", "AMYLASE" in the last 168 hours. No results for input(s): "AMMONIA" in the last 168 hours. Coagulation Profile: No results for input(s): "INR", "PROTIME" in the last 168 hours. Cardiac Enzymes: No results for input(s): "CKTOTAL", "CKMB", "CKMBINDEX", "TROPONINI" in the last 168 hours. BNP (last 3 results) No results for input(s): "PROBNP" in the last 8760 hours. HbA1C: No results for input(s): "HGBA1C" in the last 72 hours. CBG: No results for input(s): "GLUCAP" in the last 168 hours. Lipid Profile: Recent Labs    06/17/23 0226   CHOL 161  HDL 30*  LDLCALC 111*  TRIG 99  CHOLHDL 5.4   Thyroid Function Tests: Recent Labs    06/16/23 0947  TSH 3.584   Anemia Panel: Recent Labs    06/16/23 0744  FERRITIN 66  TIBC 449  IRON 49   Urine analysis:    Component Value Date/Time   COLORURINE AMBER (A) 06/16/2023 0914   APPEARANCEUR HAZY (A) 06/16/2023 0914   LABSPEC 1.020 06/16/2023 0914   PHURINE 5.0 06/16/2023 0914   GLUCOSEU 150 (A) 06/16/2023 0914   HGBUR NEGATIVE 06/16/2023 0914   BILIRUBINUR NEGATIVE 06/16/2023 0914   KETONESUR NEGATIVE 06/16/2023 0914   PROTEINUR 100 (A) 06/16/2023 0914   NITRITE NEGATIVE 06/16/2023 0914   LEUKOCYTESUR NEGATIVE 06/16/2023 0914   Sepsis Labs: @LABRCNTIP (procalcitonin:4,lacticidven:4)  )No results found for this or any previous visit (from the past 240 hours).   Radiology Studies: [place] EKG SITE RITE Result Date: 06/16/2023 If [facility] image not attached, placement could not be confirmed due to current cardiac rhythm.  ECHOCARDIOGRAM COMPLETE Result Date: 06/16/2023    ECHOCARDIOGRAM REPORT   Patient Name:   Cole Singleton Date of Exam: 06/16/2023 Medical Rec #:  [id number]        Height:       74.0 in Accession #:    [id number]       Weight:       375.0 lb Date of Birth:  May 03, 1975        BSA:          2.839 m Patient Age:    48 years         BP:           98/83 mmHg Patient Gender: M                HR:           84 bpm. [facility] Procedure: 2D Echo, Cardiac Doppler, Color Doppler and Intracardiac            Opacification Agent (Both Spectral and Color Flow Doppler were            utilized during procedure). Indications:    CHF-acute systolic  History:        Patient has prior history of Echocardiogram examinations, most                 recent 10/20/2021. CHF and Cardiomyopathy, Arrythmias:Atrial                 Fibrillation; Risk Factors:Hypertension and Former Smoker. NICM.  Sonographer:    Vern Claude Referring Phys: [id number] OLADAPO ADEFESO   Sonographer Comments: Image acquisition challenging due to patient body habitus. IMPRESSIONS  1. Images are limited.  2. Left ventricular ejection fraction, by estimation, is approximately 20%. The left ventricle has severely decreased function. Left ventricular endocardial border not optimally defined to evaluate regional wall motion. The left ventricular internal cavity  size was moderately to severely dilated. There is mild concentric left ventricular hypertrophy. Left ventricular diastolic parameters are indeterminate.  3. Definity contrast shows no obvious formed LV mural thrombus.  4. Right ventricular systolic function is severely reduced. The right ventricular size is normal. Tricuspid regurgitation signal is inadequate for assessing PA pressure.  5. The mitral valve is grossly normal. Trivial mitral valve regurgitation.  6. The aortic valve was not well visualized. Aortic valve regurgitation is not visualized. Aortic valve mean gradient measures 1.0 mmHg.  7. The inferior vena cava is dilated in size with <50% respiratory variability, suggesting right atrial pressure of 15 mmHg. Comparison(s): Prior images reviewed side by side. LVEF severely reduced at approximately 20%, RV dysfunction is new. FINDINGS  Left Ventricle: Left ventricular ejection fraction, by estimation, is 20%. The left ventricle has severely decreased function. Left ventricular endocardial border not optimally defined to evaluate regional wall motion. Strain imaging was not performed. The left ventricular internal cavity size was moderately to severely dilated. There is mild concentric left ventricular hypertrophy. Left ventricular diastolic function could not be evaluated due to atrial fibrillation. Left ventricular diastolic parameters are indeterminate. Right Ventricle: The right ventricular size is normal. Right vetricular wall thickness was not well visualized. Right ventricular systolic function is severely reduced. Tricuspid  regurgitation signal is inadequate for assessing PA pressure. Left Atrium: Left atrial size was normal in size. Right Atrium: Right atrial size was normal in size. Pericardium: Trivial pericardial effusion is present. The pericardial effusion is posterior to the left ventricle and lateral to the left ventricle. Mitral Valve: The mitral valve is grossly normal. Trivial mitral valve regurgitation. MV peak gradient, 4.8 mmHg. The mean mitral valve gradient is 2.0 mmHg. Tricuspid Valve: The tricuspid valve is not well visualized. Tricuspid valve regurgitation is trivial. Aortic Valve: The aortic valve was not well visualized. There is mild aortic valve annular calcification. Aortic valve regurgitation is not visualized. Aortic valve mean gradient measures 1.0 mmHg. Aortic valve peak gradient measures 2.1 mmHg. Aortic valve area, by VTI measures 2.21 cm. Pulmonic Valve: The pulmonic valve was not well visualized. Pulmonic valve regurgitation is trivial. Aorta: The aortic root and ascending aorta are structurally normal, with no evidence of dilitation. Venous: The inferior vena cava is dilated in size with less than 50% respiratory variability, suggesting right atrial pressure of 15 mmHg. IAS/Shunts: The interatrial septum was not well visualized. Additional Comments: 3D imaging was not performed.  LEFT VENTRICLE PLAX 2D LVIDd:         7.20 cm      Diastology LVIDs:         6.20 cm      LV e' lateral:   8.05 cm/s LV PW:         1.10 cm      LV E/e' lateral: 12.0 LV IVS:        0.90 cm LVOT diam:     2.00 cm LV SV:         20 LV SV Index:   7 LVOT Area:     3.14 cm  LV Volumes (MOD) LV vol d, MOD A2C: 328.0 ml LV vol d, MOD A4C: 453.0 ml LV vol s, MOD A2C: 207.0 ml LV vol s, MOD A4C: 346.0 ml LV SV MOD A2C:     121.0 ml LV SV MOD A4C:     453.0 ml LV SV MOD BP:      98.6 ml RIGHT VENTRICLE  IVC RV S prime:     6.96 cm/s  IVC diam: 2.50 cm LEFT ATRIUM             Index        RIGHT ATRIUM           Index LA  diam:        3.20 cm 1.13 cm/m   RA Area:     12.00 cm LA Vol (A2C):   56.9 ml 20.04 ml/m  RA Volume:   20.20 ml  7.11 ml/m LA Vol (A4C):   77.0 ml 27.12 ml/m LA Biplane Vol: 72.7 ml 25.61 ml/m  AORTIC VALVE                    PULMONIC VALVE AV Area (Vmax):    2.60 cm     PV Vmax:       0.56 m/s AV Area (Vmean):   2.23 cm     PV Peak grad:  1.2 mmHg AV Area (VTI):     2.21 cm AV Vmax:           73.30 cm/s AV Vmean:          44.900 cm/s AV VTI:            0.089 m AV Peak Grad:      2.1 mmHg AV Mean Grad:      1.0 mmHg LVOT Vmax:         60.60 cm/s LVOT Vmean:        31.900 cm/s LVOT VTI:          0.063 m LVOT/AV VTI ratio: 0.70  AORTA Ao Root diam: 3.20 cm Ao Asc diam:  2.80 cm MITRAL VALVE MV Area (PHT): 6.60 cm    SHUNTS MV Area VTI:   1.30 cm    Systemic VTI:  0.06 m MV Peak grad:  4.8 mmHg    Systemic Diam: 2.00 cm MV Mean grad:  2.0 mmHg MV Vmax:       1.10 m/s MV Vmean:      66.4 cm/s MV Decel Time: 115 msec MV E velocity: 96.90 cm/s Nona Dell MD Electronically signed by Nona Dell MD Signature Date/Time: 06/16/2023/3:38:25 PM    Final    DG Chest 2 View Result Date: 06/15/2023 CLINICAL DATA:  Shortness of breath. Congestion. History of congestive heart failure. Bloated feeling and short of breath. EXAM: CHEST - 2 VIEW COMPARISON:  04/28/2017 FINDINGS: Cardiac enlargement with central pulmonary vascular congestion and bilateral perihilar infiltrates consistent with edema. No pleural effusion or pneumothorax. Mediastinal contours appear intact. Degenerative changes in the spine. IMPRESSION: Cardiac enlargement with pulmonary vascular congestion and perihilar infiltrates, likely edema. Electronically Signed   By: Burman Nieves M.D.   On: 06/15/2023 19:13     Scheduled Meds:  rosuvastatin  40 mg Oral Daily   Continuous Infusions:  amiodarone 30 mg/hr (06/17/23 1005)   furosemide (LASIX) 200 mg in dextrose 5 % 100 mL (2 mg/mL) infusion Stopped (06/17/23 0735)   heparin 1,750  Units/hr (06/17/23 1005)   milrinone 0.249 mcg/kg/min (06/17/23 1005)     LOS: 1 day    Time spent:    Zannie Cove, MD [facility]   06/17/2023, 10:10 AM

## 2023-06-17 NOTE — Progress Notes (Signed)
 Heart Failure Navigator Progress Note  Assessed for Heart & Vascular TOC clinic readiness.  Patient does not meet criteria due to Advanced Heart Failure Team patient of Dr. Elwyn Lade. .   Navigator will sign off at this time.    Rhae Hammock, BSN, Scientist, clinical (histocompatibility and immunogenetics) Only

## 2023-06-17 NOTE — Progress Notes (Signed)
 PHARMACY - ANTICOAGULATION CONSULT NOTE  Pharmacy Consult for heparin Indication: atrial fibrillation  Patient Measurements: Height: 6\' 2"  (188 cm) Weight: (!) 170.1 kg (375 lb) IBW/kg (Calculated) : 82.2 Heparin Dosing Weight: 123 kg  Vital Signs: Temp: 98 F (36.7 C) (02/26 2321) Temp Source: Oral (02/26 2321) BP: 116/92 (02/26 2321) Pulse Rate: 112 (02/26 2321)  Labs: Recent Labs    06/15/23 1935 06/15/23 2109 06/16/23 0744 06/16/23 1106 06/17/23 0226  HGB 12.5*  --   --   --  12.1*  HCT 39.7  --   --   --  38.3*  PLT 187  --   --   --  283  HEPARINUNFRC  --   --   --   --  0.51  CREATININE 1.41*  --   --  1.81* 1.87*  TROPONINIHS 46* 49* 48*  --   --     Estimated Creatinine Clearance: 80.2 mL/min (A) (by C-G formula based on SCr of 1.87 mg/dL (H)).   Medical History: Past Medical History:  Diagnosis Date   Cardiomyopathy (HCC)    Cholelithiasis    Chronic HFrEF (heart failure with reduced ejection fraction) (HCC)    Diverticulosis    Hepatic steatosis    Hypertension    Morbid obesity (HCC)    OSA (obstructive sleep apnea)    Pneumonia    Pulmonary nodule     Assessment: New onset of atrial fibrillation w/RVR, transitioning from lovenox to heparin to allow for procedures if necessary from cardiology. Not on anticoagulation prior to admission.   AM: heparin level 0.51 on 1750 units/hr (therapeutic, drawn slightly early). Per RN, no signs/symptoms of bleeding or issues with the heparin infusion running continuously.  Goal of Therapy:  Heparin level 0.3-0.7 units/ml Monitor platelets by anticoagulation protocol: Yes   Plan:  Continue heparin infusion at 1750 units/hr Check confirmatory anti-Xa level in 6 hours and daily while on heparin Continue to monitor H&H and platelets  Arabella Merles, PharmD. Clinical Pharmacist 06/17/2023 3:42 AM

## 2023-06-17 NOTE — Progress Notes (Signed)
 Advanced Heart Failure Rounding Note  Cardiologist: Chrystie Nose, MD  Chief Complaint: Biventricular Heart Failure Subjective:    Coox pending PICC line. Attempted multiple times overnight, but placement unsuccessful On Milrinone 0.25 mcg/kg/min + Lasix 20 mg/hr. 2.8L UOP overnight. sCr 1.81>1.87. WBC 8>13. Afebrile In AF on Amio 30/hr  Feeling okay this moring. Did not sleep much overnight. Sitting up on the side of the bed.  Objective:   Weight Range: (!) 178.2 kg Body mass index is 50.44 kg/m.   Vital Signs:   Temp:  [97.5 F (36.4 C)-98 F (36.7 C)] 97.8 F (36.6 C) (02/27 0646) Pulse Rate:  [86-112] 103 (02/27 0646) Resp:  [19-29] 20 (02/27 0646) BP: (91-147)/(72-107) 121/85 (02/27 0646) SpO2:  [91 %-100 %] 100 % (02/27 0646) Weight:  [178.2 kg] 178.2 kg (02/27 0646) Last BM Date : 06/16/23  Weight change: Filed Weights   06/15/23 1838 06/17/23 0646  Weight: (!) 170.1 kg (!) 178.2 kg    Intake/Output:   Intake/Output Summary (Last 24 hours) at 06/17/2023 0719 Last data filed at 06/17/2023 0648 Gross per 24 hour  Intake 1071.06 ml  Output 2875 ml  Net -1803.94 ml    Physical Exam    General: Well appearing. Dyspneic with sagging O2 sats on exam with conversation.  Cardiac: JVP to jaw. S1 and S2 present. No murmurs or rub. Extremities: Warm and dry. No rash, cyanosis. 3++ edema to trunk.  Neuro: Alert and oriented x3. Affect pleasant. Moves all extremities without difficulty.  Telemetry   AF in 110-130s (personally reviewed)  EKG    N/A  Labs    CBC Recent Labs    06/15/23 1935 06/17/23 0226  WBC 8.1 13.6*  HGB 12.5* 12.1*  HCT 39.7 38.3*  MCV 76.1* 75.5*  PLT 187 283   Basic Metabolic Panel Recent Labs    11/91/47 1106 06/17/23 0226  NA 132* 134*  K 3.9 3.6  CL 99 99  CO2 20* 19*  GLUCOSE 148* 166*  BUN 28* 31*  CREATININE 1.81* 1.87*  CALCIUM 9.1 9.0  MG 2.1 1.9  PHOS  --  3.7   Liver Function Tests Recent Labs     06/16/23 1106 06/17/23 0226  AST 27 28  ALT 41 39  ALKPHOS 60 57  BILITOT 1.7* 1.1  PROT 7.6 7.1  ALBUMIN 3.8 3.6   No results for input(s): "LIPASE", "AMYLASE" in the last 72 hours. Cardiac Enzymes No results for input(s): "CKTOTAL", "CKMB", "CKMBINDEX", "TROPONINI" in the last 72 hours.  BNP: BNP (last 3 results) Recent Labs    06/15/23 1935 06/17/23 0226  BNP 244.0* 229.7*    ProBNP (last 3 results) No results for input(s): "PROBNP" in the last 8760 hours.   D-Dimer No results for input(s): "DDIMER" in the last 72 hours. Hemoglobin A1C No results for input(s): "HGBA1C" in the last 72 hours. Fasting Lipid Panel Recent Labs    06/17/23 0226  CHOL 161  HDL 30*  LDLCALC 111*  TRIG 99  CHOLHDL 5.4   Thyroid Function Tests Recent Labs    06/16/23 0947  TSH 3.584    Other results:   Imaging    [place] EKG SITE RITE Result Date: 06/16/2023 If Site Rite image not attached, placement could not be confirmed due to current cardiac rhythm.  ECHOCARDIOGRAM COMPLETE Result Date: 06/16/2023    ECHOCARDIOGRAM REPORT   Patient Name:   Cole Singleton Date of Exam: 06/16/2023 Medical Rec #:  [id number]  Height:       74.0 in Accession #:    [id number]       Weight:       375.0 lb Date of Birth:  10-14-1975        BSA:          2.839 m Patient Age:    48 years         BP:           98/83 mmHg Patient Gender: M                HR:           84 bpm. [facility] Procedure: 2D Echo, Cardiac Doppler, Color Doppler and Intracardiac            Opacification Agent (Both Spectral and Color Flow Doppler were            utilized during procedure). Indications:    CHF-acute systolic  History:        Patient has prior history of Echocardiogram examinations, most                 recent 10/20/2021. CHF and Cardiomyopathy, Arrythmias:Atrial                 Fibrillation; Risk Factors:Hypertension and Former Smoker. NICM.  Sonographer:    Vern Claude Referring Phys: [id number]  OLADAPO ADEFESO  Sonographer Comments: Image acquisition challenging due to patient body habitus. IMPRESSIONS  1. Images are limited.  2. Left ventricular ejection fraction, by estimation, is approximately 20%. The left ventricle has severely decreased function. Left ventricular endocardial border not optimally defined to evaluate regional wall motion. The left ventricular internal cavity size was moderately to severely dilated. There is mild concentric left ventricular hypertrophy. Left ventricular diastolic parameters are indeterminate.  3. Definity contrast shows no obvious formed LV mural thrombus.  4. Right ventricular systolic function is severely reduced. The right ventricular size is normal. Tricuspid regurgitation signal is inadequate for assessing PA pressure.  5. The mitral valve is grossly normal. Trivial mitral valve regurgitation.  6. The aortic valve was not well visualized. Aortic valve regurgitation is not visualized. Aortic valve mean gradient measures 1.0 mmHg.  7. The inferior vena cava is dilated in size with <50% respiratory variability, suggesting right atrial pressure of 15 mmHg. Comparison(s): Prior images reviewed side by side. LVEF severely reduced at approximately 20%, RV dysfunction is new. FINDINGS  Left Ventricle: Left ventricular ejection fraction, by estimation, is 20%. The left ventricle has severely decreased function. Left ventricular endocardial border not optimally defined to evaluate regional wall motion. Strain imaging was not performed. The left ventricular internal cavity size was moderately to severely dilated. There is mild concentric left ventricular hypertrophy. Left ventricular diastolic function could not be evaluated due to atrial fibrillation. Left ventricular diastolic parameters are indeterminate. Right Ventricle: The right ventricular size is normal. Right vetricular wall thickness was not well visualized. Right ventricular systolic function is severely reduced.  Tricuspid regurgitation signal is inadequate for assessing PA pressure. Left Atrium: Left atrial size was normal in size. Right Atrium: Right atrial size was normal in size. Pericardium: Trivial pericardial effusion is present. The pericardial effusion is posterior to the left ventricle and lateral to the left ventricle. Mitral Valve: The mitral valve is grossly normal. Trivial mitral valve regurgitation. MV peak gradient, 4.8 mmHg. The mean mitral valve gradient is 2.0 mmHg. Tricuspid Valve: The tricuspid valve is not well visualized. Tricuspid valve  regurgitation is trivial. Aortic Valve: The aortic valve was not well visualized. There is mild aortic valve annular calcification. Aortic valve regurgitation is not visualized. Aortic valve mean gradient measures 1.0 mmHg. Aortic valve peak gradient measures 2.1 mmHg. Aortic valve area, by VTI measures 2.21 cm. Pulmonic Valve: The pulmonic valve was not well visualized. Pulmonic valve regurgitation is trivial. Aorta: The aortic root and ascending aorta are structurally normal, with no evidence of dilitation. Venous: The inferior vena cava is dilated in size with less than 50% respiratory variability, suggesting right atrial pressure of 15 mmHg. IAS/Shunts: The interatrial septum was not well visualized. Additional Comments: 3D imaging was not performed.  LEFT VENTRICLE PLAX 2D LVIDd:         7.20 cm      Diastology LVIDs:         6.20 cm      LV e' lateral:   8.05 cm/s LV PW:         1.10 cm      LV E/e' lateral: 12.0 LV IVS:        0.90 cm LVOT diam:     2.00 cm LV SV:         20 LV SV Index:   7 LVOT Area:     3.14 cm  LV Volumes (MOD) LV vol d, MOD A2C: 328.0 ml LV vol d, MOD A4C: 453.0 ml LV vol s, MOD A2C: 207.0 ml LV vol s, MOD A4C: 346.0 ml LV SV MOD A2C:     121.0 ml LV SV MOD A4C:     453.0 ml LV SV MOD BP:      98.6 ml RIGHT VENTRICLE            IVC RV S prime:     6.96 cm/s  IVC diam: 2.50 cm LEFT ATRIUM             Index        RIGHT ATRIUM            Index LA diam:        3.20 cm 1.13 cm/m   RA Area:     12.00 cm LA Vol (A2C):   56.9 ml 20.04 ml/m  RA Volume:   20.20 ml  7.11 ml/m LA Vol (A4C):   77.0 ml 27.12 ml/m LA Biplane Vol: 72.7 ml 25.61 ml/m  AORTIC VALVE                    PULMONIC VALVE AV Area (Vmax):    2.60 cm     PV Vmax:       0.56 m/s AV Area (Vmean):   2.23 cm     PV Peak grad:  1.2 mmHg AV Area (VTI):     2.21 cm AV Vmax:           73.30 cm/s AV Vmean:          44.900 cm/s AV VTI:            0.089 m AV Peak Grad:      2.1 mmHg AV Mean Grad:      1.0 mmHg LVOT Vmax:         60.60 cm/s LVOT Vmean:        31.900 cm/s LVOT VTI:          0.063 m LVOT/AV VTI ratio: 0.70  AORTA Ao Root diam: 3.20 cm Ao Asc diam:  2.80 cm MITRAL VALVE MV Area (PHT):  6.60 cm    SHUNTS MV Area VTI:   1.30 cm    Systemic VTI:  0.06 m MV Peak grad:  4.8 mmHg    Systemic Diam: 2.00 cm MV Mean grad:  2.0 mmHg MV Vmax:       1.10 m/s MV Vmean:      66.4 cm/s MV Decel Time: 115 msec MV E velocity: 96.90 cm/s Nona Dell MD Electronically signed by Nona Dell MD Signature Date/Time: 06/16/2023/3:38:25 PM    Final    Medications:    Scheduled Medications:  potassium chloride  40 mEq Oral Once   rosuvastatin  40 mg Oral Daily   Infusions:  amiodarone 30 mg/hr (06/16/23 2320)   furosemide (LASIX) 200 mg in dextrose 5 % 100 mL (2 mg/mL) infusion 20 mg/hr (06/16/23 2136)   heparin 1,750 Units/hr (06/16/23 2140)   milrinone 0.25 mcg/kg/min (06/17/23 0205)   PRN Medications: acetaminophen **OR** acetaminophen, docusate sodium, ondansetron **OR** ondansetron (ZOFRAN) IV, polyethylene glycol  Patient Profile   Cole Singleton is a 48 y.o. 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   Admitted with CHF exacerbation and AF  RVR  Assessment/Plan   Acute on Chronic Biventricular Heart Failure - EF previously 35%, documented as NICM. Can not rule out ICM with metabolic syndrome. Never had LHC. Reports that his HF exacerbations are preceded by URIs, could be viral myocarditis, however symptoms of URI could be driven by HF.  - Nuclear Stress in 2019 showed moderate inferior defect consistent with prior infarct.  - Echo: EF 20%, gHK, no LV thrombus, severely reduced RV (newly reduced) - Suspect low output. Cool extremities, AKI with poor urine response.  - Place PICC line. Coox/CVP monitoring - GDMT limited by AKI - Continue Milrinone 0.25 mcg/kg/min - Continue Lasix 20 mg/hr - No bb with concern for low output - Place unna boots - May eventually need R/LHC for ischemic eval and new RV failure.    Afib with RVR - HR 110-130s on tele - continue amio gtt 30/hr - continue heparin gtt - will need DCCV once euvolemic   AKI - cardiorenal - Cr 1.4 on admit - up to 1.87 with diuresis - continue milrinone + diuresis as above   Severe OSA Obesity - severe on sleep study in 2023 - noncompliant with CPAP - Body mass index is 50.44 kg/m. - discussed need for weight loss   HTN - continue milrinone - GDMT limited by renal function   HLD - lipid panel in the am - continue crestor to 40 mg daily - continue asa 81 mg daily  Leukocytosis - Afebrile, initial procal <0.1 - WBC 8>13.6 - CTM  Length of Stay: 1  Cole Saree Krogh, NP  06/17/2023, 7:19 AM  Advanced Heart Failure Team Pager [phone] (M-F; 7a - 5p)  Please contact [facility] for night-coverage after hours (5p -7a ) and weekends on [email or web address]

## 2023-06-17 NOTE — Progress Notes (Signed)
 Orthopedic Tech Progress Note Patient Details:  Husayn Reim 1975/07/24 [id number]  Unna boots applied to BLE, depending on length of stay they will need to be reapplied on Monday 3/3.  Ortho Devices Type of Ortho Device: Radio broadcast assistant Ortho Device/Splint Location: BLE Ortho Device/Splint Interventions: Ordered, Application, Adjustment   Post Interventions Patient Tolerated: Well Instructions Provided: Care of device  Chameka Mcmullen Carmine Savoy 06/17/2023, 4:37 PM

## 2023-06-17 NOTE — TOC Initial Note (Signed)
 Transition of Care [facility]) - Initial/Assessment Note    Patient Details  Name: Cole Singleton MRN: [id number] Date of Birth: 12-Jun-1975  [facility] CM/SW Contact:    Cole Cousin, RN Phone Number: [phone] 06/17/2023, 3:08 PM  Clinical Narrative:                  HF TOC CM spoke to pt, wife and mother at bedside. Pt states he does have education on HF. Pt's wife ordered a bariatric scale for him to do daily weights. Discussed heart healthy diet and decrease in sodium. Pt states he is currently unemployed. Wife assist pt as needed at home.  Will need PCP appt, does not have PCP.    Expected Discharge Plan: Home/Self Care Barriers to Discharge: Continued Medical Work up   Patient Goals and CMS Choice Patient states their goals for this hospitalization and ongoing recovery are:: wants to remain independent          Expected Discharge Plan and Services   Discharge Planning Services: CM Consult   Living arrangements for the past 2 months: Single Family Home                                      Prior Living Arrangements/Services Living arrangements for the past 2 months: Single Family Home Lives with:: Spouse Patient language and need for interpreter reviewed:: Yes Do you feel safe going back to the place where you live?: Yes      Need for Family Participation in Patient Care: No (Comment) Care giver support system in place?: Yes (comment)   Criminal Activity/Legal Involvement Pertinent to Current Situation/Hospitalization: No - Comment as needed  Activities of Daily Living   ADL Screening (condition at time of admission) Independently performs ADLs?: Yes (appropriate for developmental age) Is the patient deaf or have difficulty hearing?: No Does the patient have difficulty seeing, even when wearing glasses/contacts?: No Does the patient have difficulty concentrating, remembering, or making decisions?: No  Permission  Sought/Granted Permission sought to share information with : Case Manager, Family Supports, PCP Permission granted to share information with : Yes, Verbal Permission Granted  Share Information with NAME: Cole Singleton     Permission granted to share info w Relationship: wife  Permission granted to share info w Contact Information: [phone]  Emotional Assessment Appearance:: Appears stated age Attitude/Demeanor/Rapport: Engaged Affect (typically observed): Accepting Orientation: : Oriented to Self, Oriented to Place, Oriented to  Time, Oriented to Situation   Psych Involvement: No (comment)  Admission diagnosis:  Acute exacerbation of CHF (congestive heart failure) (HCC) [I50.9] Atrial fibrillation with rapid ventricular response (HCC) [I48.91] Congestive heart failure, unspecified HF chronicity, unspecified heart failure type [facility]) [I50.9] Patient Active Problem List   Diagnosis Date Noted   Acute exacerbation of CHF (congestive heart failure) (HCC) 06/16/2023   Elevated brain natriuretic peptide (BNP) level 06/16/2023   Paroxysmal atrial fibrillation with RVR (HCC) 06/16/2023   Elevated troponin 06/16/2023   AKI (acute kidney injury) (HCC) 06/16/2023   Obesity, Class III, BMI 40-49.9 (morbid obesity) (HCC) 06/16/2023   Cardiomyopathy (HCC)    Educated about COVID-19 virus infection 01/16/2020   Chronic combined systolic and diastolic HF (heart failure) (HCC) 01/16/2020   Acute respiratory failure with hypoxia (HCC) 10/11/2014   Microcytic anemia 10/10/2014   Hypokalemia    Acute systolic heart failure (HCC) 10/09/2014   Hypertension  CAP (community acquired pneumonia) 10/07/2014   Obesity (BMI 35.0-39.9 without comorbidity) 10/07/2014   PCP:  Patient, [name] Pharmacy:   [facility] [address] [place] - [address] [place] [address] Phone: [phone] Fax: [phone]     Social Drivers of Health (SDOH) Social  History: SDOH Screenings   Food Insecurity: No Food Insecurity (06/16/2023)  Housing: Low Risk  (06/16/2023)  Transportation Needs: No Transportation Needs (06/16/2023)  Utilities: Not At Risk (06/16/2023)  Tobacco Use: High Risk (06/15/2023)   SDOH Interventions:     Readmission Risk Interventions     No data to display

## 2023-06-17 NOTE — Plan of Care (Signed)
  Problem: Education: Goal: Knowledge of General Education information will improve Description: Including pain rating scale, medication(s)/side effects and non-pharmacologic comfort measures Outcome: Progressing   Problem: Clinical Measurements: Goal: Ability to maintain clinical measurements within normal limits will improve Outcome: Progressing   Problem: Elimination: Goal: Will not experience complications related to urinary retention Outcome: Progressing   

## 2023-06-17 NOTE — TOC Benefit Eligibility Note (Signed)
 Pharmacy Patient Advocate Encounter  Insurance verification completed.    The patient is insured through [facility] MEDICAID.     Ran test claim for Eliquis and the current 30 day co-pay is $4.   This test claim was processed through [employer]- copay amounts may vary at other pharmacies due to [employer], or as the patient moves through the different stages of their insurance plan.

## 2023-06-17 NOTE — Progress Notes (Signed)
 Peripherally Inserted Central Catheter Placement  The IV Nurse has discussed with the patient and/or persons authorized to consent for the patient, the purpose of this procedure and the potential benefits and risks involved with this procedure.  The benefits include less needle sticks, lab draws from the catheter, and the patient may be discharged home with the catheter. Risks include, but not limited to, infection, bleeding, blood clot (thrombus formation), and puncture of an artery; nerve damage and irregular heartbeat and possibility to perform a PICC exchange if needed/ordered by physician.  Alternatives to this procedure were also discussed.  Bard Power PICC patient education guide, fact sheet on infection prevention and patient information card has been provided to patient /or left at bedside.    PICC Placement Documentation  PICC Triple Lumen 06/17/23 Left Cephalic 51 cm 0 cm (Active)  Indication for Insertion or Continuance of Line Vasoactive infusions 06/17/23 0815  Exposed Catheter (cm) 0 cm 06/17/23 0815  Site Assessment Clean, Dry, Intact 06/17/23 0815  Lumen #1 Status Flushed;Saline locked;Blood return noted 06/17/23 0815  Lumen #2 Status Flushed;Saline locked;Blood return noted 06/17/23 0815  Lumen #3 Status Flushed;Saline locked;Blood return noted 06/17/23 0815  Dressing Type Transparent;Securing device 06/17/23 0815  Dressing Status Antimicrobial disc/dressing in place;Clean, Dry, Intact 06/17/23 0815  Dressing Intervention New dressing;Adhesive placed at insertion site (IV team only) 06/17/23 0815  Dressing Change Due 06/24/23 06/17/23 0815       Reginia Forts Albarece 06/17/2023, 8:17 AM

## 2023-06-17 NOTE — Progress Notes (Signed)
 PHARMACY - ANTICOAGULATION CONSULT NOTE  Pharmacy Consult for heparin Indication: atrial fibrillation  Allergies  Allergen Reactions   Codeine Other (See Comments)    Severe headache Pt reports being able to take percocet & vicodin    Patient Measurements: Height: 6\' 2"  (188 cm) Weight: (!) 178.2 kg (392 lb 13.8 oz) IBW/kg (Calculated) : 82.2 Heparin Dosing Weight: 123 kg  Vital Signs: Temp: 97.8 F (36.6 C) (02/27 1116) Temp Source: Oral (02/27 1116) BP: 129/96 (02/27 1116) Pulse Rate: 76 (02/27 1116)  Labs: Recent Labs    06/15/23 1935 06/15/23 2109 06/16/23 0744 06/16/23 1106 06/17/23 0226 06/17/23 0933  HGB 12.5*  --   --   --  12.1*  --   HCT 39.7  --   --   --  38.3*  --   PLT 187  --   --   --  283  --   HEPARINUNFRC  --   --   --   --  0.51 0.43  CREATININE 1.41*  --   --  1.81* 1.87*  --   TROPONINIHS 46* 49* 48*  --   --   --     Estimated Creatinine Clearance: 82.4 mL/min (A) (by C-G formula based on SCr of 1.87 mg/dL (H)).   Medical History: Past Medical History:  Diagnosis Date   Cardiomyopathy (HCC)    Cholelithiasis    Chronic HFrEF (heart failure with reduced ejection fraction) (HCC)    Diverticulosis    Hepatic steatosis    Hypertension    Morbid obesity (HCC)    OSA (obstructive sleep apnea)    Pneumonia    Pulmonary nodule     Medications:  Medications Prior to Admission  Medication Sig Dispense Refill Last Dose/Taking   amoxicillin-clavulanate (AUGMENTIN) 875-125 MG tablet Take 1 tablet by mouth 2 (two) times daily.   Past Week   benzonatate (TESSALON) 100 MG capsule Take 100 mg by mouth 3 (three) times daily as needed.   Past Week   carvedilol (COREG) 6.25 MG tablet Take 1 tablet (6.25 mg total) by mouth 2 (two) times daily. 60 tablet 5 06/15/2023 Noon   empagliflozin (JARDIANCE) 10 MG TABS tablet Take 1 tablet (10 mg total) by mouth daily before breakfast. 30 tablet 5 06/15/2023 Noon   furosemide (LASIX) 40 MG tablet Take 1 tablet  (40 mg total) by mouth as needed for fluid or edema. 30 tablet 5 Past Week   rosuvastatin (CRESTOR) 10 MG tablet Take 1 tablet (10 mg total) by mouth daily. 30 tablet 4 06/15/2023 Noon   sacubitril-valsartan (ENTRESTO) 49-51 MG Take 1 tablet by mouth twice daily 180 tablet 3 06/15/2023 Noon   simethicone (MYLICON) 80 MG chewable tablet Chew 80 mg by mouth every 6 (six) hours as needed for flatulence.   06/15/2023 Noon   albuterol (VENTOLIN HFA) 108 (90 Base) MCG/ACT inhaler SMARTSIG:2 Puff(s) By Mouth Every 6 Hours PRN (Patient not taking: Reported on 06/16/2023)   Not Taking    Assessment: New onset of atrial fibrillation w/RVR, transitioning from lovenox to heparin to allow for procedures if necessary from cardiology. Not on anticoagulation prior to admission.   Heparin level this morning within goal range at 0.43.  No overt bleeding or complications noted.  Goal of Therapy:  Heparin level 0.3-0.7 units/ml Monitor platelets by anticoagulation protocol: Yes   Plan:  Continue IV heparin 1750 units/hr. Daily heparin level and CBC.  Reece Leader, Colon Flattery, [facility] Clinical Pharmacist  06/17/2023 2:30 PM  [facility] pharmacy phone numbers are listed on [email or web address]

## 2023-06-18 ENCOUNTER — Encounter (HOSPITAL_COMMUNITY): Payer: Self-pay | Admitting: Cardiology

## 2023-06-18 ENCOUNTER — Inpatient Hospital Stay (HOSPITAL_COMMUNITY): Payer: Medicaid Other | Admitting: Anesthesiology

## 2023-06-18 ENCOUNTER — Encounter (HOSPITAL_COMMUNITY)
Admission: EM | Disposition: A | Discharge status: Home / Self Care | Payer: Self-pay | Source: Home / Self Care | Attending: Internal Medicine

## 2023-06-18 ENCOUNTER — Inpatient Hospital Stay (HOSPITAL_COMMUNITY): Payer: Medicaid Other

## 2023-06-18 DIAGNOSIS — I4891 Unspecified atrial fibrillation: Secondary | ICD-10-CM | POA: Diagnosis not present

## 2023-06-18 DIAGNOSIS — I110 Hypertensive heart disease with heart failure: Secondary | ICD-10-CM

## 2023-06-18 DIAGNOSIS — I340 Nonrheumatic mitral (valve) insufficiency: Secondary | ICD-10-CM | POA: Diagnosis not present

## 2023-06-18 DIAGNOSIS — I361 Nonrheumatic tricuspid (valve) insufficiency: Secondary | ICD-10-CM | POA: Diagnosis not present

## 2023-06-18 DIAGNOSIS — I081 Rheumatic disorders of both mitral and tricuspid valves: Secondary | ICD-10-CM | POA: Diagnosis not present

## 2023-06-18 DIAGNOSIS — I5042 Chronic combined systolic (congestive) and diastolic (congestive) heart failure: Secondary | ICD-10-CM

## 2023-06-18 DIAGNOSIS — I5023 Acute on chronic systolic (congestive) heart failure: Secondary | ICD-10-CM | POA: Diagnosis not present

## 2023-06-18 HISTORY — PX: TRANSESOPHAGEAL ECHOCARDIOGRAM (CATH LAB): EP1270

## 2023-06-18 LAB — COMPREHENSIVE METABOLIC PANEL
ALT: 37 U/L (ref 0–44)
AST: 28 U/L (ref 15–41)
Albumin: 3.5 g/dL (ref 3.5–5.0)
Alkaline Phosphatase: 53 U/L (ref 38–126)
Anion gap: 16 — ABNORMAL HIGH (ref 5–15)
BUN: 29 mg/dL — ABNORMAL HIGH (ref 6–20)
CO2: 25 mmol/L (ref 22–32)
Calcium: 8.1 mg/dL — ABNORMAL LOW (ref 8.9–10.3)
Chloride: 88 mmol/L — ABNORMAL LOW (ref 98–111)
Creatinine, Ser: 1.68 mg/dL — ABNORMAL HIGH (ref 0.61–1.24)
GFR, Estimated: 50 mL/min — ABNORMAL LOW (ref >60)
Glucose, Bld: 382 mg/dL — ABNORMAL HIGH (ref 70–99)
Potassium: 2.7 mmol/L — CL (ref 3.5–5.1)
Sodium: 129 mmol/L — ABNORMAL LOW (ref 135–145)
Total Bilirubin: 1.1 mg/dL (ref 0.0–1.2)
Total Protein: 6.8 g/dL (ref 6.5–8.1)

## 2023-06-18 LAB — COOXEMETRY PANEL
Carboxyhemoglobin: 2.8 % — ABNORMAL HIGH (ref 0.5–1.5)
Carboxyhemoglobin: 3.2 % — ABNORMAL HIGH (ref 0.5–1.5)
Methemoglobin: <0.7 % (ref 0.0–1.5)
Methemoglobin: <0.7 % (ref 0.0–1.5)
O2 Saturation: 59 %
O2 Saturation: 55.9 %
Total hemoglobin: 12.1 g/dL (ref 12.0–16.0)
Total hemoglobin: 12.8 g/dL (ref 12.0–16.0)

## 2023-06-18 LAB — BRAIN NATRIURETIC PEPTIDE: B Natriuretic Peptide: 97.7 pg/mL (ref 0.0–100.0)

## 2023-06-18 LAB — BASIC METABOLIC PANEL
Anion gap: 18 — ABNORMAL HIGH (ref 5–15)
BUN: 27 mg/dL — ABNORMAL HIGH (ref 6–20)
CO2: 26 mmol/L (ref 22–32)
Calcium: 8.7 mg/dL — ABNORMAL LOW (ref 8.9–10.3)
Chloride: 89 mmol/L — ABNORMAL LOW (ref 98–111)
Creatinine, Ser: 1.51 mg/dL — ABNORMAL HIGH (ref 0.61–1.24)
GFR, Estimated: 57 mL/min — ABNORMAL LOW (ref >60)
Glucose, Bld: 307 mg/dL — ABNORMAL HIGH (ref 70–99)
Potassium: 3.3 mmol/L — ABNORMAL LOW (ref 3.5–5.1)
Sodium: 133 mmol/L — ABNORMAL LOW (ref 135–145)

## 2023-06-18 LAB — CBC
HCT: 38.5 % — ABNORMAL LOW (ref 39.0–52.0)
Hemoglobin: 12 g/dL — ABNORMAL LOW (ref 13.0–17.0)
MCH: 23.4 pg — ABNORMAL LOW (ref 26.0–34.0)
MCHC: 31.2 g/dL (ref 30.0–36.0)
MCV: 75 fL — ABNORMAL LOW (ref 80.0–100.0)
Platelets: 264 10*3/uL (ref 150–400)
RBC: 5.13 MIL/uL (ref 4.22–5.81)
RDW: 17.6 % — ABNORMAL HIGH (ref 11.5–15.5)
WBC: 11.2 10*3/uL — ABNORMAL HIGH (ref 4.0–10.5)
nRBC: 0 % (ref 0.0–0.2)

## 2023-06-18 LAB — GLUCOSE, CAPILLARY
Glucose-Capillary: 148 mg/dL — ABNORMAL HIGH (ref 70–99)
Glucose-Capillary: 118 mg/dL — ABNORMAL HIGH (ref 70–99)

## 2023-06-18 LAB — MAGNESIUM: Magnesium: 1.8 mg/dL (ref 1.7–2.4)

## 2023-06-18 LAB — ECHO TEE: Est EF: <20

## 2023-06-18 LAB — HEMOGLOBIN A1C
Hgb A1c MFr Bld: 6.5 % — ABNORMAL HIGH (ref 4.8–5.6)
Mean Plasma Glucose: 139.85 mg/dL

## 2023-06-18 LAB — HEPARIN LEVEL (UNFRACTIONATED)
Heparin Unfractionated: 0.21 [IU]/mL — ABNORMAL LOW (ref 0.30–0.70)
Heparin Unfractionated: 0.11 [IU]/mL — ABNORMAL LOW (ref 0.30–0.70)

## 2023-06-18 SURGERY — TRANSESOPHAGEAL ECHOCARDIOGRAM (TEE) (CATHLAB)
Anesthesia: Monitor Anesthesia Care

## 2023-06-18 MED ORDER — EMPAGLIFLOZIN 10 MG PO TABS
10.0000 mg | ORAL_TABLET | Freq: Every day | ORAL | Status: DC
  Administered 2023-06-19 – 2023-06-22 (×4): 10 mg via ORAL
  Filled 2023-06-18 (×4): qty 1

## 2023-06-18 MED ORDER — INSULIN GLARGINE 100 UNIT/ML ~~LOC~~ SOLN
15.0000 [IU] | Freq: Every day | SUBCUTANEOUS | Status: DC
  Administered 2023-06-18: 15 [IU] via SUBCUTANEOUS
  Filled 2023-06-18: qty 0.15

## 2023-06-18 MED ORDER — INSULIN GLARGINE-YFGN 100 UNIT/ML ~~LOC~~ SOLN
10.0000 [IU] | Freq: Every day | SUBCUTANEOUS | Status: DC
  Filled 2023-06-18: qty 0.1

## 2023-06-18 MED ORDER — POTASSIUM CHLORIDE CRYS ER 20 MEQ PO TBCR
40.0000 meq | EXTENDED_RELEASE_TABLET | ORAL | Status: DC
  Administered 2023-06-18: 40 meq via ORAL
  Filled 2023-06-18: qty 2

## 2023-06-18 MED ORDER — POTASSIUM CHLORIDE CRYS ER 20 MEQ PO TBCR: 60.0000 meq | EXTENDED_RELEASE_TABLET | ORAL | Status: DC

## 2023-06-18 MED ORDER — SODIUM CHLORIDE 0.9 % IV SOLN
INTRAVENOUS | Status: DC | PRN
Start: 2023-06-18 — End: 2023-06-18

## 2023-06-18 MED ORDER — INSULIN ASPART 100 UNIT/ML IJ SOLN
0.0000 [IU] | Freq: Three times a day (TID) | INTRAMUSCULAR | Status: DC
  Administered 2023-06-18 – 2023-06-19 (×3): 2 [IU] via SUBCUTANEOUS
  Administered 2023-06-19: 1 [IU] via SUBCUTANEOUS
  Administered 2023-06-19 – 2023-06-21 (×2): 2 [IU] via SUBCUTANEOUS

## 2023-06-18 MED ORDER — SODIUM CHLORIDE 0.9% FLUSH
3.0000 mL | Freq: Two times a day (BID) | INTRAVENOUS | Status: DC
  Administered 2023-06-18: 10 mL via INTRAVENOUS

## 2023-06-18 MED ORDER — POTASSIUM CHLORIDE 10 MEQ/100ML IV SOLN
10.0000 meq | INTRAVENOUS | Status: AC
Start: 2023-06-18 — End: 2023-06-18
  Administered 2023-06-18 (×2): 10 meq via INTRAVENOUS
  Filled 2023-06-18 (×2): qty 100

## 2023-06-18 MED ORDER — POTASSIUM CHLORIDE 10 MEQ/100ML IV SOLN
10.0000 meq | Freq: Once | INTRAVENOUS | Status: AC
Start: 2023-06-18 — End: 2023-06-18
  Administered 2023-06-18: 10 meq via INTRAVENOUS
  Filled 2023-06-18: qty 100

## 2023-06-18 MED ORDER — LIVING WELL WITH DIABETES BOOK
Freq: Once | Status: AC
  Filled 2023-06-18: qty 1

## 2023-06-18 MED ORDER — PROPOFOL 500 MG/50ML IV EMUL
INTRAVENOUS | Status: DC | PRN
  Administered 2023-06-18: 80 ug/kg/min via INTRAVENOUS

## 2023-06-18 MED ORDER — SPIRONOLACTONE 25 MG PO TABS
25.0000 mg | ORAL_TABLET | Freq: Every day | ORAL | Status: DC
  Administered 2023-06-18 – 2023-06-22 (×5): 25 mg via ORAL
  Filled 2023-06-18 (×5): qty 1

## 2023-06-18 MED ORDER — POTASSIUM CHLORIDE CRYS ER 20 MEQ PO TBCR
60.0000 meq | EXTENDED_RELEASE_TABLET | ORAL | Status: AC
  Administered 2023-06-18: 60 meq via ORAL
  Filled 2023-06-18: qty 3

## 2023-06-18 MED ORDER — LIDOCAINE HCL (CARDIAC) PF 100 MG/5ML IV SOSY
PREFILLED_SYRINGE | INTRAVENOUS | Status: DC | PRN
  Administered 2023-06-18: 80 mg via INTRATRACHEAL

## 2023-06-18 MED ORDER — PHENYLEPHRINE 80 MCG/ML (10ML) SYRINGE FOR IV PUSH (FOR BLOOD PRESSURE SUPPORT)
PREFILLED_SYRINGE | INTRAVENOUS | Status: DC | PRN
  Administered 2023-06-18: 160 ug via INTRAVENOUS
  Administered 2023-06-18: 120 ug via INTRAVENOUS

## 2023-06-18 MED ORDER — IRON SUCROSE 200 MG IVPB - SIMPLE MED: 200.0000 mg | Status: DC

## 2023-06-18 MED ORDER — PROPOFOL 10 MG/ML IV BOLUS
INTRAVENOUS | Status: DC | PRN
  Administered 2023-06-18: 10 mg via INTRAVENOUS
  Administered 2023-06-18: 20 mg via INTRAVENOUS
  Administered 2023-06-18: 10 mg via INTRAVENOUS
  Administered 2023-06-18 (×2): 20 mg via INTRAVENOUS

## 2023-06-18 MED ORDER — INSULIN GLARGINE 100 UNIT/ML ~~LOC~~ SOLN
10.0000 [IU] | Freq: Every day | SUBCUTANEOUS | Status: DC
  Filled 2023-06-18: qty 0.1

## 2023-06-18 MED ORDER — MAGNESIUM SULFATE 4 GM/100ML IV SOLN
4.0000 g | Freq: Once | INTRAVENOUS | Status: AC
  Administered 2023-06-18: 4 g via INTRAVENOUS
  Filled 2023-06-18: qty 100

## 2023-06-18 MED ORDER — SODIUM CHLORIDE 0.9% FLUSH: 3.0000 mL | INTRAVENOUS | Status: DC | PRN

## 2023-06-18 MED ORDER — POTASSIUM CHLORIDE CRYS ER 20 MEQ PO TBCR
60.0000 meq | EXTENDED_RELEASE_TABLET | ORAL | Status: AC
  Administered 2023-06-18 (×2): 60 meq via ORAL
  Filled 2023-06-18 (×2): qty 3

## 2023-06-18 MED ORDER — HEPARIN BOLUS VIA INFUSION
3500.0000 [IU] | Freq: Once | INTRAVENOUS | Status: AC
  Administered 2023-06-18: 3500 [IU] via INTRAVENOUS
  Filled 2023-06-18: qty 3500

## 2023-06-18 MED ORDER — SODIUM CHLORIDE 0.9 % IV SOLN
200.0000 mg | INTRAVENOUS | Status: AC
  Administered 2023-06-18 – 2023-06-19 (×2): 200 mg via INTRAVENOUS
  Filled 2023-06-18 (×2): qty 10

## 2023-06-18 SURGICAL SUPPLY — 1 items: PAD DEFIB RADIO PHYSIO CONN (PAD) ×1 IMPLANT

## 2023-06-18 NOTE — Progress Notes (Signed)
 Advanced Heart Failure Rounding Note  Cardiologist: Chrystie Nose, MD  Chief Complaint: Biventricular Heart Failure  Subjective:    Coox 59%. CVP 12-13. 6.9L UOP overnight (net negative 3.2L). On Lasix 30/hr.  In AF with higher rates 130-140s on Amio 60/hr. Plan for TEE/DCCV today  Feels well this morning. No SOB or CP. Moving around the room.   Objective:    Weight Range: (!) 173 kg Body mass index is 48.97 kg/m.   Vital Signs:   Temp:  [97.8 F (36.6 C)-98.4 F (36.9 C)] 98.1 F (36.7 C) (02/28 0441) Pulse Rate:  [68-138] 68 (02/28 0722) Resp:  [19-25] 21 (02/28 0722) BP: (99-137)/(83-108) 99/83 (02/28 0441) SpO2:  [90 %-98 %] 96 % (02/28 0722) Weight:  [173 kg] 173 kg (02/28 0441) Last BM Date : 06/17/23  Weight change: Filed Weights   06/16/23 1700 06/17/23 0646 06/18/23 0441  Weight: (!) 180.7 kg (!) 178.2 kg (!) 173 kg   Intake/Output:   Intake/Output Summary (Last 24 hours) at 06/18/2023 0753 Last data filed at 06/18/2023 0456 Gross per 24 hour  Intake 3696.93 ml  Output 6950 ml  Net -3253.07 ml    Physical Exam    CVP 12-13 General: Obese appearing. No distress on RA Cardiac: JVP to jaw. S1 and S2 present. Tachy and irregular Abdomen: Obese, soft Extremities: Warm and dry. No rash, cyanosis.  2+ edema.  Neuro: Alert and oriented x3. Affect pleasant. Moves all extremities without difficulty. Lines/Devices:  LUE PICC  Telemetry   AF in 120-150s (personally reviewed)  EKG    N/A  Labs    CBC Recent Labs    06/17/23 0226 06/18/23 0545  WBC 13.6* 11.2*  HGB 12.1* 12.0*  HCT 38.3* 38.5*  MCV 75.5* 75.0*  PLT 283 264   Basic Metabolic Panel Recent Labs    16/10/96 0226 06/17/23 1400 06/18/23 0545  NA 134* 132* 129*  K 3.6 3.3* 2.7*  CL 99 95* 88*  CO2 19* 23 25  GLUCOSE 166* 293* 382*  BUN 31* 33* 29*  CREATININE 1.87* 2.02* 1.68*  CALCIUM 9.0 8.5* 8.1*  MG 1.9  --  1.8  PHOS 3.7  --   --    Liver Function  Tests Recent Labs    06/17/23 0226 06/18/23 0545  AST 28 28  ALT 39 37  ALKPHOS 57 53  BILITOT 1.1 1.1  PROT 7.1 6.8  ALBUMIN 3.6 3.5   No results for input(s): "LIPASE", "AMYLASE" in the last 72 hours. Cardiac Enzymes No results for input(s): "CKTOTAL", "CKMB", "CKMBINDEX", "TROPONINI" in the last 72 hours.  BNP: BNP (last 3 results) Recent Labs    06/15/23 1935 06/17/23 0226 06/18/23 0545  BNP 244.0* 229.7* 97.7    ProBNP (last 3 results) No results for input(s): "PROBNP" in the last 8760 hours.   D-Dimer No results for input(s): "DDIMER" in the last 72 hours. Hemoglobin A1C No results for input(s): "HGBA1C" in the last 72 hours. Fasting Lipid Panel Recent Labs    06/17/23 0226  CHOL 161  HDL 30*  LDLCALC 111*  TRIG 99  CHOLHDL 5.4   Thyroid Function Tests Recent Labs    06/16/23 0947  TSH 3.584    Other results:   Imaging    DG Chest Port 1 View Result Date: 06/17/2023 CLINICAL DATA:  045409 S/P PICC central line placement [id number] EXAM: PORTABLE CHEST 1 VIEW COMPARISON:  06/15/2023 FINDINGS: Interval placement of left-sided PICC line with distal tip terminating  near the superior cavoatrial junction. Multiple overlying cardiac leads. Stable cardiomegaly. Slight improvement in the degree of edema bilaterally. No pneumothorax. IMPRESSION: 1. Interval placement of left-sided PICC line with distal tip terminating near the superior cavoatrial junction. 2. Slight improvement in the degree of edema. Electronically Signed   By: Duanne Guess D.O.   On: 06/17/2023 11:17   Medications:    Scheduled Medications:  Chlorhexidine Gluconate Cloth  6 each Topical Daily   insulin aspart  0-15 Units Subcutaneous TID WC   insulin glargine  15 Units Subcutaneous Daily   potassium chloride  40 mEq Oral Q3H   rosuvastatin  40 mg Oral Daily   sacubitril-valsartan  1 tablet Oral BID   sodium chloride flush  10-40 mL Intracatheter Q12H   Infusions:  amiodarone 60  mg/hr (06/18/23 0600)   furosemide (LASIX) 200 mg in dextrose 5 % 100 mL (2 mg/mL) infusion 30 mg/hr (06/18/23 0315)   heparin 1,750 Units/hr (06/18/23 0315)   iron sucrose Stopped (06/17/23 1437)   milrinone 0.25 mcg/kg/min (06/18/23 0751)   PRN Medications: acetaminophen **OR** acetaminophen, alum & mag hydroxide-simeth, docusate sodium, ondansetron **OR** ondansetron (ZOFRAN) IV, polyethylene glycol, sodium chloride flush  Patient Profile   Cole Singleton is a 48 y.o. 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   Admitted with CHF exacerbation and AF RVR  Assessment/Plan   Acute on Chronic Biventricular Heart Failure - EF previously 35%, documented as NICM. Can not rule out ICM with metabolic syndrome. Never had LHC. Reports that his HF exacerbations are preceded by URIs, could be viral myocarditis, however symptoms of URI could be driven by HF.  - Nuclear Stress in 2019 showed moderate inferior defect consistent with prior infarct.  - Echo: EF 20%, gHK, no LV thrombus, severely reduced RV (newly reduced) - Suspect low output. Cool extremities, AKI with poor urine response.  - GDMT limited by AKI - Co-ox 59, CVP 12-13 - Continue Milrinone 0.25 mcg/kg/min - Continue Lasix 30 mg/hr - No bb with concern for low output - Place unna boots - May eventually need R/LHC for ischemic eval and new RV failure.    Afib with RVR - Rates increased overnight up to 150s - continue amio gtt 60/hr - continue heparin gtt - TEE/DCCV today  Informed Consent   Shared Decision Making/Informed Consent   The risks [stroke, cardiac arrhythmias rarely resulting in the need for a temporary or permanent pacemaker, skin irritation or burns, esophageal damage, perforation (1:10,000  risk), bleeding, pharyngeal hematoma as well as other potential complications associated with conscious sedation including aspiration, arrhythmia, respiratory failure and death], benefits (treatment guidance, restoration of normal sinus rhythm, diagnostic support) and alternatives of a transesophageal echocardiogram guided cardioversion were discussed in detail with Mr. Sedlacek and he is willing to proceed.    AKI - cardiorenal - Cr 1.4 on admit - up to 2.02>1.68 (with successful diuresis) - continue milrinone + diuresis as above   Severe OSA Obesity - severe on sleep study in 2023 - noncompliant with CPAP - Body mass index is 48.97 kg/m. - discussed need for weight loss   HTN - continue milrinone - GDMT limited by renal function   HLD - lipid panel in the am - continue crestor to 40 mg daily - continue asa 81 mg daily  Leukocytosis - Afebrile, procal <0.1 - WBC 8>13.6>11.2 - CTM  Hypokalemia - K 2.7 this am - aggressive supp with IV and PO - goal K>4 - BMP this afternoon  Length of Stay: 2  [place] Jesicca Dipierro, NP  06/18/2023, 7:53 AM  Advanced Heart Failure Team Pager [phone] (M-F; 7a - 5p)  Please contact [facility] for night-coverage after hours (5p -7a ) and weekends on [email or web address]

## 2023-06-18 NOTE — CV Procedure (Signed)
   TRANSESOPHAGEAL ECHOCARDIOGRAM GUIDED DIRECT CURRENT CARDIOVERSION  NAME:  Cole Singleton    MRN: [id number] DOB:  Sep 27, 1975    ADMIT DATE: 06/15/2023  INDICATIONS: Symptomatic atrial fibrillation  PROCEDURE:   Informed consent was obtained prior to the procedure. The risks, benefits and alternatives for the procedure were discussed and the patient comprehended these risks.  Risks include, but are not limited to, cough, sore throat, vomiting, nausea, somnolence, esophageal and stomach trauma or perforation, bleeding, low blood pressure, aspiration, pneumonia, infection, trauma to the teeth and death.    After a procedural timeout, the patient was administered propofol per anesthesia.  The patient's heart rate, blood pressure, and oxygen saturation were monitored continuously during the procedure.  The transesophageal probe was inserted in the esophagus and stomach without difficulty and multiple views were obtained.  The patient was kept under observation until the patient left the procedure room.  The patient left the procedure room in stable condition.    COMPLICATIONS:    Complications: No complications Patient tolerated procedure well.  KEY FINDINGS:  Severe LV dysfunction with EF <20% Moderate mitral regurgitation Mild-moderate tricuspid regurgitation Moderate RV dysfunction Biatrial dilation No evidence of intraatrial shunt Small, mobile thrombus seen in the LAA TR velocity of 3.78m/s Full Report to follow.   CARDIOVERSION:     Indications:  Symptomatic Atrial Fibrillation  Procedure Details:   Procedure cancelled given presence of clot in the left atrial appendage.  Clearnce Hasten Advanced Heart Failure 4:29 PM

## 2023-06-18 NOTE — H&P (View-Only) (Signed)
 Advanced Heart Failure Rounding Note  Cardiologist: Chrystie Nose, MD  Chief Complaint: Biventricular Heart Failure  Subjective:    Coox 59%. CVP 12-13. 6.9L UOP overnight (net negative 3.2L). On Lasix 30/hr.  In AF with higher rates 130-140s on Amio 60/hr. Plan for TEE/DCCV today  Feels well this morning. No SOB or CP. Moving around the room.   Objective:    Weight Range: (!) 173 kg Body mass index is 48.97 kg/m.   Vital Signs:   Temp:  [97.8 F (36.6 C)-98.4 F (36.9 C)] 98.1 F (36.7 C) (02/28 0441) Pulse Rate:  [68-138] 68 (02/28 0722) Resp:  [19-25] 21 (02/28 0722) BP: (99-137)/(83-108) 99/83 (02/28 0441) SpO2:  [90 %-98 %] 96 % (02/28 0722) Weight:  [173 kg] 173 kg (02/28 0441) Last BM Date : 06/17/23  Weight change: Filed Weights   06/16/23 1700 06/17/23 0646 06/18/23 0441  Weight: (!) 180.7 kg (!) 178.2 kg (!) 173 kg   Intake/Output:   Intake/Output Summary (Last 24 hours) at 06/18/2023 0753 Last data filed at 06/18/2023 0456 Gross per 24 hour  Intake 3696.93 ml  Output 6950 ml  Net -3253.07 ml    Physical Exam    CVP 12-13 General: Obese appearing. No distress on RA Cardiac: JVP to jaw. S1 and S2 present. Tachy and irregular Abdomen: Obese, soft Extremities: Warm and dry. No rash, cyanosis.  2+ edema.  Neuro: Alert and oriented x3. Affect pleasant. Moves all extremities without difficulty. Lines/Devices:  LUE PICC  Telemetry   AF in 120-150s (personally reviewed)  EKG    N/A  Labs    CBC Recent Labs    06/17/23 0226 06/18/23 0545  WBC 13.6* 11.2*  HGB 12.1* 12.0*  HCT 38.3* 38.5*  MCV 75.5* 75.0*  PLT 283 264   Basic Metabolic Panel Recent Labs    16/10/96 0226 06/17/23 1400 06/18/23 0545  NA 134* 132* 129*  K 3.6 3.3* 2.7*  CL 99 95* 88*  CO2 19* 23 25  GLUCOSE 166* 293* 382*  BUN 31* 33* 29*  CREATININE 1.87* 2.02* 1.68*  CALCIUM 9.0 8.5* 8.1*  MG 1.9  --  1.8  PHOS 3.7  --   --    Liver Function  Tests Recent Labs    06/17/23 0226 06/18/23 0545  AST 28 28  ALT 39 37  ALKPHOS 57 53  BILITOT 1.1 1.1  PROT 7.1 6.8  ALBUMIN 3.6 3.5   No results for input(s): "LIPASE", "AMYLASE" in the last 72 hours. Cardiac Enzymes No results for input(s): "CKTOTAL", "CKMB", "CKMBINDEX", "TROPONINI" in the last 72 hours.  BNP: BNP (last 3 results) Recent Labs    06/15/23 1935 06/17/23 0226 06/18/23 0545  BNP 244.0* 229.7* 97.7    ProBNP (last 3 results) No results for input(s): "PROBNP" in the last 8760 hours.   D-Dimer No results for input(s): "DDIMER" in the last 72 hours. Hemoglobin A1C No results for input(s): "HGBA1C" in the last 72 hours. Fasting Lipid Panel Recent Labs    06/17/23 0226  CHOL 161  HDL 30*  LDLCALC 111*  TRIG 99  CHOLHDL 5.4   Thyroid Function Tests Recent Labs    06/16/23 0947  TSH 3.584    Other results:   Imaging    DG Chest Port 1 View Result Date: 06/17/2023 CLINICAL DATA:  045409 S/P PICC central line placement [id number] EXAM: PORTABLE CHEST 1 VIEW COMPARISON:  06/15/2023 FINDINGS: Interval placement of left-sided PICC line with distal tip terminating  near the superior cavoatrial junction. Multiple overlying cardiac leads. Stable cardiomegaly. Slight improvement in the degree of edema bilaterally. No pneumothorax. IMPRESSION: 1. Interval placement of left-sided PICC line with distal tip terminating near the superior cavoatrial junction. 2. Slight improvement in the degree of edema. Electronically Signed   By: Duanne Guess D.O.   On: 06/17/2023 11:17   Medications:    Scheduled Medications:  Chlorhexidine Gluconate Cloth  6 each Topical Daily   insulin aspart  0-15 Units Subcutaneous TID WC   insulin glargine  15 Units Subcutaneous Daily   potassium chloride  40 mEq Oral Q3H   rosuvastatin  40 mg Oral Daily   sacubitril-valsartan  1 tablet Oral BID   sodium chloride flush  10-40 mL Intracatheter Q12H   Infusions:  amiodarone 60  mg/hr (06/18/23 0600)   furosemide (LASIX) 200 mg in dextrose 5 % 100 mL (2 mg/mL) infusion 30 mg/hr (06/18/23 0315)   heparin 1,750 Units/hr (06/18/23 0315)   iron sucrose Stopped (06/17/23 1437)   milrinone 0.25 mcg/kg/min (06/18/23 0751)   PRN Medications: acetaminophen **OR** acetaminophen, alum & mag hydroxide-simeth, docusate sodium, ondansetron **OR** ondansetron (ZOFRAN) IV, polyethylene glycol, sodium chloride flush  Patient Profile   Cole Singleton is a 48 y.o. 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   Admitted with CHF exacerbation and AF RVR  Assessment/Plan   Acute on Chronic Biventricular Heart Failure - EF previously 35%, documented as NICM. Can not rule out ICM with metabolic syndrome. Never had LHC. Reports that his HF exacerbations are preceded by URIs, could be viral myocarditis, however symptoms of URI could be driven by HF.  - Nuclear Stress in 2019 showed moderate inferior defect consistent with prior infarct.  - Echo: EF 20%, gHK, no LV thrombus, severely reduced RV (newly reduced) - Suspect low output. Cool extremities, AKI with poor urine response.  - GDMT limited by AKI - Co-ox 59, CVP 12-13 - Continue Milrinone 0.25 mcg/kg/min - Continue Lasix 30 mg/hr - No bb with concern for low output - Place unna boots - May eventually need R/LHC for ischemic eval and new RV failure.    Afib with RVR - Rates increased overnight up to 150s - continue amio gtt 60/hr - continue heparin gtt - TEE/DCCV today  Informed Consent   Shared Decision Making/Informed Consent   The risks [stroke, cardiac arrhythmias rarely resulting in the need for a temporary or permanent pacemaker, skin irritation or burns, esophageal damage, perforation (1:10,000  risk), bleeding, pharyngeal hematoma as well as other potential complications associated with conscious sedation including aspiration, arrhythmia, respiratory failure and death], benefits (treatment guidance, restoration of normal sinus rhythm, diagnostic support) and alternatives of a transesophageal echocardiogram guided cardioversion were discussed in detail with Mr. Sedlacek and he is willing to proceed.    AKI - cardiorenal - Cr 1.4 on admit - up to 2.02>1.68 (with successful diuresis) - continue milrinone + diuresis as above   Severe OSA Obesity - severe on sleep study in 2023 - noncompliant with CPAP - Body mass index is 48.97 kg/m. - discussed need for weight loss   HTN - continue milrinone - GDMT limited by renal function   HLD - lipid panel in the am - continue crestor to 40 mg daily - continue asa 81 mg daily  Leukocytosis - Afebrile, procal <0.1 - WBC 8>13.6>11.2 - CTM  Hypokalemia - K 2.7 this am - aggressive supp with IV and PO - goal K>4 - BMP this afternoon  Length of Stay: 2  [place] Jesicca Dipierro, NP  06/18/2023, 7:53 AM  Advanced Heart Failure Team Pager [phone] (M-F; 7a - 5p)  Please contact [facility] for night-coverage after hours (5p -7a ) and weekends on [email or web address]

## 2023-06-18 NOTE — Progress Notes (Signed)
 Heart rates fluctuated between 120's and 159 overnight. Patient remained asymptomatic. BP stable. Cardiology paged. No new order received.

## 2023-06-18 NOTE — Progress Notes (Signed)
 Echocardiogram Echocardiogram Transesophageal has been performed.  Cole Singleton 06/18/2023, 4:26 PM

## 2023-06-18 NOTE — Transfer of Care (Signed)
 Immediate Anesthesia Transfer of Care Note  Patient: Cole Singleton  Procedure(s) Performed: TRANSESOPHAGEAL ECHOCARDIOGRAM  Patient Location: PACU  Anesthesia Type:MAC  Level of Consciousness: awake, alert , oriented, and patient cooperative  Airway & Oxygen Therapy: Patient Spontanous Breathing  Post-op Assessment: Report given to RN and Post -op Vital signs reviewed and stable  Post vital signs: Reviewed and stable  Last Vitals:  Vitals Value Taken Time  BP 109/87 06/18/23 1622  Temp    Pulse 127 06/18/23 1625  Resp 26 06/18/23 1625  SpO2 91 % 06/18/23 1626  Vitals shown include unfiled device data.  Last Pain:  Vitals:   06/18/23 1622  TempSrc:   PainSc: 0-No pain         Complications: No notable events documented.

## 2023-06-18 NOTE — Progress Notes (Signed)
 PHARMACY - ANTICOAGULATION CONSULT NOTE  Pharmacy Consult for heparin Indication: atrial fibrillation  Allergies  Allergen Reactions   Codeine Other (See Comments)    Severe headache Pt reports being able to take percocet & vicodin    Patient Measurements: Height: 6\' 2"  (188 cm) Weight: (!) 173 kg (381 lb 6.3 oz) (Scale A) IBW/kg (Calculated) : 82.2 Heparin Dosing Weight: 123 kg  Vital Signs: Temp: 98 F (36.7 C) (02/28 0752) Temp Source: Oral (02/28 0752) BP: 99/83 (02/28 0441) Pulse Rate: 68 (02/28 0722)  Labs: Recent Labs    06/15/23 1935 06/15/23 2109 06/16/23 0744 06/16/23 1106 06/17/23 0226 06/17/23 0933 06/17/23 1400 06/18/23 0545  HGB 12.5*  --   --   --  12.1*  --   --  12.0*  HCT 39.7  --   --   --  38.3*  --   --  38.5*  PLT 187  --   --   --  283  --   --  264  HEPARINUNFRC  --   --   --   --  0.51 0.43  --  0.21*  CREATININE 1.41*  --   --    < > 1.87*  --  2.02* 1.68*  TROPONINIHS 46* 49* 48*  --   --   --   --   --    < > = values in this interval not displayed.    Estimated Creatinine Clearance: 90.1 mL/min (A) (by C-G formula based on SCr of 1.68 mg/dL (H)).   Medical History: Past Medical History:  Diagnosis Date   Cardiomyopathy (HCC)    Cholelithiasis    Chronic HFrEF (heart failure with reduced ejection fraction) (HCC)    Diverticulosis    Hepatic steatosis    Hypertension    Morbid obesity (HCC)    OSA (obstructive sleep apnea)    Pneumonia    Pulmonary nodule     Medications:  Medications Prior to Admission  Medication Sig Dispense Refill Last Dose/Taking   [EXPIRED] amoxicillin-clavulanate (AUGMENTIN) 875-125 MG tablet Take 1 tablet by mouth 2 (two) times daily.   Past Week   benzonatate (TESSALON) 100 MG capsule Take 100 mg by mouth 3 (three) times daily as needed.   Past Week   carvedilol (COREG) 6.25 MG tablet Take 1 tablet (6.25 mg total) by mouth 2 (two) times daily. 60 tablet 5 06/15/2023 Noon   empagliflozin  (JARDIANCE) 10 MG TABS tablet Take 1 tablet (10 mg total) by mouth daily before breakfast. 30 tablet 5 06/15/2023 Noon   furosemide (LASIX) 40 MG tablet Take 1 tablet (40 mg total) by mouth as needed for fluid or edema. 30 tablet 5 Past Week   rosuvastatin (CRESTOR) 10 MG tablet Take 1 tablet (10 mg total) by mouth daily. 30 tablet 4 06/15/2023 Noon   sacubitril-valsartan (ENTRESTO) 49-51 MG Take 1 tablet by mouth twice daily 180 tablet 3 06/15/2023 Noon   simethicone (MYLICON) 80 MG chewable tablet Chew 80 mg by mouth every 6 (six) hours as needed for flatulence.   06/15/2023 Noon   albuterol (VENTOLIN HFA) 108 (90 Base) MCG/ACT inhaler SMARTSIG:2 Puff(s) By Mouth Every 6 Hours PRN (Patient not taking: Reported on 06/16/2023)   Not Taking    Assessment: New onset of atrial fibrillation w/RVR, transitioning from lovenox to heparin to allow for procedures if necessary from cardiology. Not on anticoagulation prior to admission.   Heparin level this morning 0.21 fell to subtherapeutic  on heparin drip rate 1750  uts/hr.  No overt bleeding or complications noted. Planning DCCV today will increase heparin drip   Goal of Therapy:  Heparin level 0.3-0.7 units/ml Monitor platelets by anticoagulation protocol: Yes   Plan:  Increase IV heparin 1850 units/hr. Daily heparin level and CBC.    Leota Sauers Pharm.D. CPP, BCPS Clinical Pharmacist [phone] 06/18/2023 10:43 AM   [facility] pharmacy phone numbers are listed on [email or web address]

## 2023-06-18 NOTE — Anesthesia Preprocedure Evaluation (Addendum)
 Anesthesia Evaluation  Patient identified by MRN, date of birth, ID band Patient awake    Reviewed: Allergy & Precautions, Patient's Chart, lab work & pertinent test results, reviewed documented beta blocker date and time , Unable to perform ROS - Chart review only  Airway Mallampati: III  TM Distance: >3 FB Neck ROM: Full    Dental  (+) Poor Dentition   Pulmonary sleep apnea , pneumonia    + decreased breath sounds      Cardiovascular hypertension, Pt. on home beta blockers and Pt. on medications +CHF   Rhythm:Irregular Rate:Tachycardia  Echo   1. Images are limited.   2. Left ventricular ejection fraction, by estimation, is approximately 20%. The left ventricle has severely decreased function. Left ventricular endocardial border not optimally defined to evaluate regional wall motion. The left ventricular internal cavity size was moderately to severely dilated. There is mild concentric left ventricular hypertrophy. Left ventricular diastolic parameters are indeterminate.   3. Definity contrast shows no obvious formed LV mural thrombus.   4. Right ventricular systolic function is severely reduced. The right ventricular size is normal. Tricuspid regurgitation signal is inadequate for assessing PA pressure.   5. The mitral valve is grossly normal. Trivial mitral valve regurgitation.   6. The aortic valve was not well visualized. Aortic valve regurgitation is not visualized. Aortic valve mean gradient measures 1.0 mmHg.   7. The inferior vena cava is dilated in size with <50% respiratory variability, suggesting right atrial pressure of 15 mmHg.   Comparison(s): Prior images reviewed side by side. LVEF severely reduced  at approximately 20%, RV dysfunction is new.     Neuro/Psych negative neurological ROS     GI/Hepatic negative GI ROS, Neg liver ROS,,,  Endo/Other    Class 3 obesity  Renal/GU Renal disease      Musculoskeletal negative musculoskeletal ROS (+)    Abdominal  (+) + obese  Peds  Hematology  (+) Blood dyscrasia, anemia   Anesthesia Other Findings   Reproductive/Obstetrics                             Anesthesia Physical Anesthesia Plan  ASA: 4  Anesthesia Plan: MAC   Post-op Pain Management: Minimal or no pain anticipated   Induction: Intravenous  PONV Risk Score and Plan: TIVA and Propofol infusion  Airway Management Planned: Natural Airway  Additional Equipment: ClearSight  Intra-op Plan:   Post-operative Plan:   Informed Consent: I have reviewed the patients History and Physical, chart, labs and discussed the procedure including the risks, benefits and alternatives for the proposed anesthesia with the patient or authorized representative who has indicated his/her understanding and acceptance.     Dental advisory given  Plan Discussed with: CRNA  Anesthesia Plan Comments:         Anesthesia Quick Evaluation

## 2023-06-18 NOTE — Progress Notes (Signed)
 PHARMACY - ANTICOAGULATION CONSULT NOTE  Pharmacy Consult for heparin Indication: atrial fibrillation / clot noted in left atrial appendage on TEE  Allergies  Allergen Reactions   Codeine Other (See Comments)    Severe headache Pt reports being able to take percocet & vicodin    Patient Measurements: Height: 6\' 2"  (188 cm) Weight: (!) 173 kg (381 lb 6.3 oz) (Scale A) IBW/kg (Calculated) : 82.2 Heparin Dosing Weight: 123 kg  Vital Signs: Temp: 97.9 F (36.6 C) (02/28 1903) Temp Source: Oral (02/28 1903) BP: 105/60 (02/28 1903) Pulse Rate: 99 (02/28 1903)  Labs: Recent Labs    06/15/23 1935 06/15/23 2109 06/16/23 0744 06/16/23 1106 06/17/23 0226 06/17/23 0933 06/17/23 1400 06/18/23 0545 06/18/23 1444 06/18/23 1800  HGB 12.5*  --   --   --  12.1*  --   --  12.0*  --   --   HCT 39.7  --   --   --  38.3*  --   --  38.5*  --   --   PLT 187  --   --   --  283  --   --  264  --   --   HEPARINUNFRC  --   --   --    < > 0.51 0.43  --  0.21*  --  0.11*  CREATININE 1.41*  --   --    < > 1.87*  --  2.02* 1.68* 1.51*  --   TROPONINIHS 46* 49* 48*  --   --   --   --   --   --   --    < > = values in this interval not displayed.    Estimated Creatinine Clearance: 100.3 mL/min (A) (by C-G formula based on SCr of 1.51 mg/dL (H)).   Medical History: Past Medical History:  Diagnosis Date   Cardiomyopathy (HCC)    Cholelithiasis    Chronic HFrEF (heart failure with reduced ejection fraction) (HCC)    Diverticulosis    Hepatic steatosis    Hypertension    Morbid obesity (HCC)    OSA (obstructive sleep apnea)    Pneumonia    Pulmonary nodule     Medications:  Medications Prior to Admission  Medication Sig Dispense Refill Last Dose/Taking   [EXPIRED] amoxicillin-clavulanate (AUGMENTIN) 875-125 MG tablet Take 1 tablet by mouth 2 (two) times daily.   Past Week   benzonatate (TESSALON) 100 MG capsule Take 100 mg by mouth 3 (three) times daily as needed.   Past Week    carvedilol (COREG) 6.25 MG tablet Take 1 tablet (6.25 mg total) by mouth 2 (two) times daily. 60 tablet 5 06/15/2023 Noon   empagliflozin (JARDIANCE) 10 MG TABS tablet Take 1 tablet (10 mg total) by mouth daily before breakfast. 30 tablet 5 06/15/2023 Noon   furosemide (LASIX) 40 MG tablet Take 1 tablet (40 mg total) by mouth as needed for fluid or edema. 30 tablet 5 Past Week   rosuvastatin (CRESTOR) 10 MG tablet Take 1 tablet (10 mg total) by mouth daily. 30 tablet 4 06/15/2023 Noon   sacubitril-valsartan (ENTRESTO) 49-51 MG Take 1 tablet by mouth twice daily 180 tablet 3 06/15/2023 Noon   simethicone (MYLICON) 80 MG chewable tablet Chew 80 mg by mouth every 6 (six) hours as needed for flatulence.   06/15/2023 Noon   albuterol (VENTOLIN HFA) 108 (90 Base) MCG/ACT inhaler SMARTSIG:2 Puff(s) By Mouth Every 6 Hours PRN (Patient not taking: Reported on 06/16/2023)   Not  Taking    Assessment: New onset of atrial fibrillation w/RVR, transitioning from lovenox to heparin to allow for procedures if necessary from cardiology. Not on anticoagulation prior to admission.   Heparin level this morning 0.21 fell to subtherapeutic  on heparin drip rate 1750 uts/hr.  No overt bleeding or complications noted. Planning DCCV today will increase heparin drip   2/28 PM update: Procedure details from Dr. Elwyn Lade: TEE guided DCCV- Procedure cancelled given presence of clot in the left atrial appendage.  HL 0.11 No signs of bleeding or issues with infusion  Goal of Therapy:  Heparin level 0.3-0.7 units/ml Monitor platelets by anticoagulation protocol: Yes   Plan:  Heparin bolus 3500 units iv x1 Increase IV heparin 2250 units/hr. HL in 8 hours Daily heparin level and CBC.    Greta Doom BS, PharmD, BCPS Clinical Pharmacist 06/18/2023 7:26 PM  Contact: [phone] after 3 PM  "Be curious, not judgmental..." -Debbora Dus

## 2023-06-18 NOTE — Interval H&P Note (Signed)
 History and Physical Interval Note:  06/18/2023 3:48 PM  Cole Singleton  has presented today for surgery, with the diagnosis of afib.  The various methods of treatment have been discussed with the patient and family. After consideration of risks, benefits and other options for treatment, the patient has consented to  Procedure(s): TRANSESOPHAGEAL ECHOCARDIOGRAM (N/A) CARDIOVERSION (N/A) as a surgical intervention.  The patient's history has been reviewed, patient examined, no change in status, stable for surgery.  I have reviewed the patient's chart and labs.  Questions were answered to the patient's satisfaction.     Romie Minus

## 2023-06-18 NOTE — Plan of Care (Signed)
  Problem: Education: Goal: Knowledge of General Education information will improve Description: Including pain rating scale, medication(s)/side effects and non-pharmacologic comfort measures Outcome: Progressing   Problem: Clinical Measurements: Goal: Will remain free from infection Outcome: Progressing Goal: Respiratory complications will improve Outcome: Progressing   Problem: Activity: Goal: Risk for activity intolerance will decrease Outcome: Progressing   Problem: Nutrition: Goal: Adequate nutrition will be maintained Outcome: Progressing   Problem: Coping: Goal: Level of anxiety will decrease Outcome: Progressing   Problem: Elimination: Goal: Will not experience complications related to urinary retention Outcome: Progressing

## 2023-06-18 NOTE — Inpatient Diabetes Management (Addendum)
 Inpatient Diabetes Program Recommendations  AACE/ADA: New Consensus Statement on Inpatient Glycemic Control   Target Ranges:  Prepandial:   less than 140 mg/dL      Peak postprandial:   less than 180 mg/dL (1-2 hours)      Critically ill patients:  140 - 180 mg/dL    Latest Reference Range & Units 06/18/23 12:06  Glucose-Capillary 70 - 99 mg/dL 161 (H)    Latest Reference Range & Units 06/15/23 19:35 06/16/23 11:06 06/17/23 02:26 06/17/23 14:00 06/18/23 05:45  Glucose 70 - 99 mg/dL 096 (H) 045 (H) 409 (H) 293 (H) 382 (H)    Latest Reference Range & Units 03/24/23 12:34 06/18/23 06:39  Hemoglobin A1C 4.8 - 5.6 % 5.8 (H) 6.5 (H)   Review of Glycemic Control  Diabetes history: No Outpatient Diabetes medications: Jardiance 10 mg QAM (for CHF) Current orders for Inpatient glycemic control: Lantus 15 units daily, Novolog 0-15 units TID with meals  Inpatient Diabetes Program Recommendations:    Insulin: Noted Lantus 15 units daily ordered today and already given. Anticipate hyperglycemia on 2/27 and 2/28 related to Solumedrol given on 06/16/23.   May want to discontinue Lantus and just use Novolog correction.  HbgA1C:  A1C 6.5% on 06/18/23 indicating an average glucose of 148 mg/dl over the past 2-3 months. Consult received for new onset DM.  Outpatient DM: Please provide Rx for glucose monitoring kit (#[id number]) at discharge.  NOTE: Patient received Solumedrol 40 mg on 06/16/23 at 9:42 am which likely contributed to hyperglycemia noted on labs yesterday and today.  Received consult for new DM dx. Patient admitted on 06/16/23 with acute exacerbation of CHF, a-fib w/RVR, hypokalemia, elevated troponin, microcytic anemia, and AKI.   Spoke with patient at bedside about new diabetes diagnosis. Patient reports that he has no prior DM or preDM hx. He states that he had labs done about 1 month ago and he was not told if his glucose was elevated. Patient reports no family hx of DM but does note that his  wife has DM but does not take any DM meds.  Discussed A1C results (6.5% on 2/28) and explained what an A1C is and informed patient that his current A1C indicates an average glucose of 148 mg/dl over the past 2-3 months. Discussed basic pathophysiology of DM Type 2, basic home care, importance of checking CBGs and maintaining good CBG control to prevent long-term and short-term complications. Reviewed glucose and A1C goals.   Reviewed signs and symptoms of hyperglycemia and hypoglycemia along with treatment for both. Discussed impact of nutrition, exercise, stress, sickness, and medications on diabetes control. Discussed carb modified diet, portion control, limiting carbs per meal, and eliminating sugary beverages. Reviewed Living Well with diabetes booklet and encouraged patient to read through entire book. Patient does not currently have a PCP but his cardiology office is trying to get him a PCP to establish care with.  Discussed glucose monitoring in case he is asked to check glucose outpatient.  Patient verbalized understanding of information discussed and he states that he has no further questions at this time related to diabetes.   RNs to provide ongoing basic DM education at bedside with this patient and engage patient to actively check blood glucose.  Thanks, Orlando Penner, RN, MSN, CDE Diabetes Coordinator Inpatient Diabetes Program [phone] (Team Pager from 8am to 5pm)

## 2023-06-18 NOTE — Progress Notes (Addendum)
 PROGRESS NOTE    Cole Singleton  GEX:[id number] DOB: [birth year] DOA: 06/15/2023 PCP: Patient, [name]  48/M with morbid obesity, chronic systolic CHF, OSA, hypertension, fatty liver disease, admitted to [facility] with volume overload and AFib RVR -2D echo noted EF of 20%, severely reduced RV, transferred to [facility] -2/26 started empiric milrinone and Lasix gtt. and amiodarone gtt   Subjective: -Feels better overall, no events overnight, breathing and swelling improving  Assessment and Plan:  Acute on chronic systolic CHF, BiV failure -Previously with chronic systolic CHF, EF 01%, presumed NICM -Echo now with EF 20%, severely reduced RV -Diuresing better on Lasix and milrinone, CHF team following,  -Will need right and left heart cath suspected to be low output, advanced heart failure team following -On Lasix gtt., 20 Mg per hour and milrinone, continue Aldactone and Entresto -Out of bed to chair  A-fib RVR -Remains tachycardic, currently on Amio gtt. and heparin gtt. -Plan for cardioversion today  AKI -Suspected to be cardiorenal, now improving, continue milrinone and Lasix as above  Severe OSA, obesity BMI is 50.4 -Was unable to tolerate CPAP in the past -Recommended repeat sleep study and compliance with CPAP -Weight loss and lifestyle modification recommended  Hyperglycemia, DM2 -A1c 6.5, consistent with new diabetes -Add SGLT2i this admit -CBGs significantly high today, add Semglee and SSI  Mild leukocytosis -Possibly reactive, monitor  Severe fatty liver disease -Albumin is 3.6 now  Hypokalemia Replace  Hypomagnesemia Replace  DVT prophylaxis: Code Status:  Family Communication: Disposition Plan:   Consultants:    Procedures:   Antimicrobials:    Objective: Vitals:   06/17/23 2349 06/18/23 0441 06/18/23 0722 06/18/23 0752  BP: (!) 123/108 99/83    Pulse: (!) 132 (!) 138 68   Resp: 19 19 (!) 21   Temp: 98.4 F (36.9 C) 98.1 F  (36.7 C)  98 F (36.7 C)  TempSrc: Oral Oral  Oral  SpO2: 90% 98% 96%   Weight:  (!) 173 kg    Height:        Intake/Output Summary (Last 24 hours) at 06/18/2023 0957 Last data filed at 06/18/2023 0919 Gross per 24 hour  Intake 3456.93 ml  Output 02725 ml  Net -7243.07 ml   Filed Weights   06/16/23 1700 06/17/23 0646 06/18/23 0441  Weight: (!) 180.7 kg (!) 178.2 kg (!) 173 kg    Examination:  General exam: Appears calm and comfortable obese chronically ill male HEENT: Positive JVD Respiratory system: Decreased breath sounds at the bases Cardiovascular system: S1 & S2 heard, irregular Abd: nondistended, soft and nontender.Normal bowel sounds heard. Central nervous system: Alert and oriented. No focal neurological deficits. Extremities: 2+ edema Skin: Multiple maculopapular lesions since scars throughout lower legs Psychiatry:  Mood & affect appropriate.     Data Reviewed:   CBC: Recent Labs  Lab 06/15/23 1935 06/17/23 0226 06/18/23 0545  WBC 8.1 13.6* 11.2*  HGB 12.5* 12.1* 12.0*  HCT 39.7 38.3* 38.5*  MCV 76.1* 75.5* 75.0*  PLT 187 283 264   Basic Metabolic Panel: Recent Labs  Lab 06/15/23 1935 06/16/23 1106 06/17/23 0226 06/17/23 1400 06/18/23 0545  NA 138 132* 134* 132* 129*  K 3.3* 3.9 3.6 3.3* 2.7*  CL 102 99 99 95* 88*  CO2 23 20* 19* 23 25  GLUCOSE 104* 148* 166* 293* 382*  BUN 23* 28* 31* 33* 29*  CREATININE 1.41* 1.81* 1.87* 2.02* 1.68*  CALCIUM 9.2 9.1 9.0 8.5* 8.1*  MG  --  2.1 1.9  --  1.8  PHOS  --   --  3.7  --   --    GFR: Estimated Creatinine Clearance: 90.1 mL/min (A) (by C-G formula based on SCr of 1.68 mg/dL (H)). Liver Function Tests: Recent Labs  Lab 06/16/23 1106 06/17/23 0226 06/18/23 0545  AST 27 28 28   ALT 41 39 37  ALKPHOS 60 57 53  BILITOT 1.7* 1.1 1.1  PROT 7.6 7.1 6.8  ALBUMIN 3.8 3.6 3.5   No results for input(s): "LIPASE", "AMYLASE" in the last 168 hours. No results for input(s): "AMMONIA" in the last 168  hours. Coagulation Profile: No results for input(s): "INR", "PROTIME" in the last 168 hours. Cardiac Enzymes: No results for input(s): "CKTOTAL", "CKMB", "CKMBINDEX", "TROPONINI" in the last 168 hours. BNP (last 3 results) No results for input(s): "PROBNP" in the last 8760 hours. HbA1C: Recent Labs    06/18/23 0639  HGBA1C 6.5*   CBG: No results for input(s): "GLUCAP" in the last 168 hours. Lipid Profile: Recent Labs    06/17/23 0226  CHOL 161  HDL 30*  LDLCALC 111*  TRIG 99  CHOLHDL 5.4   Thyroid Function Tests: Recent Labs    06/16/23 0947  TSH 3.584   Anemia Panel: Recent Labs    06/16/23 0744  FERRITIN 66  TIBC 449  IRON 49   Urine analysis:    Component Value Date/Time   COLORURINE AMBER (A) 06/16/2023 0914   APPEARANCEUR HAZY (A) 06/16/2023 0914   LABSPEC 1.020 06/16/2023 0914   PHURINE 5.0 06/16/2023 0914   GLUCOSEU 150 (A) 06/16/2023 0914   HGBUR NEGATIVE 06/16/2023 0914   BILIRUBINUR NEGATIVE 06/16/2023 0914   KETONESUR NEGATIVE 06/16/2023 0914   PROTEINUR 100 (A) 06/16/2023 0914   NITRITE NEGATIVE 06/16/2023 0914   LEUKOCYTESUR NEGATIVE 06/16/2023 0914   Sepsis Labs: @LABRCNTIP (procalcitonin:4,lacticidven:4)  )No results found for this or any previous visit (from the past 240 hours).   Radiology Studies: DG Chest Port 1 View Result Date: 06/17/2023 CLINICAL DATA:  [id number] S/P PICC central line placement [id number] EXAM: PORTABLE CHEST 1 VIEW COMPARISON:  06/15/2023 FINDINGS: Interval placement of left-sided PICC line with distal tip terminating near the superior cavoatrial junction. Multiple overlying cardiac leads. Stable cardiomegaly. Slight improvement in the degree of edema bilaterally. No pneumothorax. IMPRESSION: 1. Interval placement of left-sided PICC line with distal tip terminating near the superior cavoatrial junction. 2. Slight improvement in the degree of edema. Electronically Signed   By: Duanne Guess D.O.   On: 06/17/2023 11:17    [place] EKG SITE RITE Result Date: 06/16/2023 If [facility] image not attached, placement could not be confirmed due to current cardiac rhythm.  ECHOCARDIOGRAM COMPLETE Result Date: 06/16/2023    ECHOCARDIOGRAM REPORT   Patient Name:   Cole Singleton Date of Exam: 06/16/2023 Medical Rec #:  [id number]        Height:       74.0 in Accession #:    [id number]       Weight:       375.0 lb Date of Birth:  [birth year]        BSA:          2.839 m Patient Age:    48 years         BP:           98/83 mmHg Patient Gender: M                HR:  84 bpm. [facility] Procedure: 2D Echo, Cardiac Doppler, Color Doppler and Intracardiac            Opacification Agent (Both Spectral and Color Flow Doppler were            utilized during procedure). Indications:    CHF-acute systolic  History:        Patient has prior history of Echocardiogram examinations, most                 recent 10/20/2021. CHF and Cardiomyopathy, Arrythmias:Atrial                 Fibrillation; Risk Factors:Hypertension and Former Smoker. NICM.  Sonographer:    Vern Claude Referring Phys: [id number] OLADAPO ADEFESO  Sonographer Comments: Image acquisition challenging due to patient body habitus. IMPRESSIONS  1. Images are limited.  2. Left ventricular ejection fraction, by estimation, is approximately 20%. The left ventricle has severely decreased function. Left ventricular endocardial border not optimally defined to evaluate regional wall motion. The left ventricular internal cavity size was moderately to severely dilated. There is mild concentric left ventricular hypertrophy. Left ventricular diastolic parameters are indeterminate.  3. Definity contrast shows no obvious formed LV mural thrombus.  4. Right ventricular systolic function is severely reduced. The right ventricular size is normal. Tricuspid regurgitation signal is inadequate for assessing PA pressure.  5. The mitral valve is grossly normal. Trivial mitral valve regurgitation.   6. The aortic valve was not well visualized. Aortic valve regurgitation is not visualized. Aortic valve mean gradient measures 1.0 mmHg.  7. The inferior vena cava is dilated in size with <50% respiratory variability, suggesting right atrial pressure of 15 mmHg. Comparison(s): Prior images reviewed side by side. LVEF severely reduced at approximately 20%, RV dysfunction is new. FINDINGS  Left Ventricle: Left ventricular ejection fraction, by estimation, is 20%. The left ventricle has severely decreased function. Left ventricular endocardial border not optimally defined to evaluate regional wall motion. Strain imaging was not performed. The left ventricular internal cavity size was moderately to severely dilated. There is mild concentric left ventricular hypertrophy. Left ventricular diastolic function could not be evaluated due to atrial fibrillation. Left ventricular diastolic parameters are indeterminate. Right Ventricle: The right ventricular size is normal. Right vetricular wall thickness was not well visualized. Right ventricular systolic function is severely reduced. Tricuspid regurgitation signal is inadequate for assessing PA pressure. Left Atrium: Left atrial size was normal in size. Right Atrium: Right atrial size was normal in size. Pericardium: Trivial pericardial effusion is present. The pericardial effusion is posterior to the left ventricle and lateral to the left ventricle. Mitral Valve: The mitral valve is grossly normal. Trivial mitral valve regurgitation. MV peak gradient, 4.8 mmHg. The mean mitral valve gradient is 2.0 mmHg. Tricuspid Valve: The tricuspid valve is not well visualized. Tricuspid valve regurgitation is trivial. Aortic Valve: The aortic valve was not well visualized. There is mild aortic valve annular calcification. Aortic valve regurgitation is not visualized. Aortic valve mean gradient measures 1.0 mmHg. Aortic valve peak gradient measures 2.1 mmHg. Aortic valve area, by VTI  measures 2.21 cm. Pulmonic Valve: The pulmonic valve was not well visualized. Pulmonic valve regurgitation is trivial. Aorta: The aortic root and ascending aorta are structurally normal, with no evidence of dilitation. Venous: The inferior vena cava is dilated in size with less than 50% respiratory variability, suggesting right atrial pressure of 15 mmHg. IAS/Shunts: The interatrial septum was not well visualized. Additional Comments: 3D imaging was  not performed.  LEFT VENTRICLE PLAX 2D LVIDd:         7.20 cm      Diastology LVIDs:         6.20 cm      LV e' lateral:   8.05 cm/s LV PW:         1.10 cm      LV E/e' lateral: 12.0 LV IVS:        0.90 cm LVOT diam:     2.00 cm LV SV:         20 LV SV Index:   7 LVOT Area:     3.14 cm  LV Volumes (MOD) LV vol d, MOD A2C: 328.0 ml LV vol d, MOD A4C: 453.0 ml LV vol s, MOD A2C: 207.0 ml LV vol s, MOD A4C: 346.0 ml LV SV MOD A2C:     121.0 ml LV SV MOD A4C:     453.0 ml LV SV MOD BP:      98.6 ml RIGHT VENTRICLE            IVC RV S prime:     6.96 cm/s  IVC diam: 2.50 cm LEFT ATRIUM             Index        RIGHT ATRIUM           Index LA diam:        3.20 cm 1.13 cm/m   RA Area:     12.00 cm LA Vol (A2C):   56.9 ml 20.04 ml/m  RA Volume:   20.20 ml  7.11 ml/m LA Vol (A4C):   77.0 ml 27.12 ml/m LA Biplane Vol: 72.7 ml 25.61 ml/m  AORTIC VALVE                    PULMONIC VALVE AV Area (Vmax):    2.60 cm     PV Vmax:       0.56 m/s AV Area (Vmean):   2.23 cm     PV Peak grad:  1.2 mmHg AV Area (VTI):     2.21 cm AV Vmax:           73.30 cm/s AV Vmean:          44.900 cm/s AV VTI:            0.089 m AV Peak Grad:      2.1 mmHg AV Mean Grad:      1.0 mmHg LVOT Vmax:         60.60 cm/s LVOT Vmean:        31.900 cm/s LVOT VTI:          0.063 m LVOT/AV VTI ratio: 0.70  AORTA Ao Root diam: 3.20 cm Ao Asc diam:  2.80 cm MITRAL VALVE MV Area (PHT): 6.60 cm    SHUNTS MV Area VTI:   1.30 cm    Systemic VTI:  0.06 m MV Peak grad:  4.8 mmHg    Systemic Diam: 2.00 cm MV Mean  grad:  2.0 mmHg MV Vmax:       1.10 m/s MV Vmean:      66.4 cm/s MV Decel Time: 115 msec MV E velocity: 96.90 cm/s Nona Dell MD Electronically signed by Nona Dell MD Signature Date/Time: 06/16/2023/3:38:25 PM    Final      Scheduled Meds:  Chlorhexidine Gluconate Cloth  6 each Topical Daily   insulin aspart  0-15 Units Subcutaneous TID  WC   insulin glargine  15 Units Subcutaneous Daily   potassium chloride  60 mEq Oral Q3H   rosuvastatin  40 mg Oral Daily   sacubitril-valsartan  1 tablet Oral BID   sodium chloride flush  10-40 mL Intracatheter Q12H   spironolactone  25 mg Oral Daily   Continuous Infusions:  amiodarone 60 mg/hr (06/18/23 0600)   furosemide (LASIX) 200 mg in dextrose 5 % 100 mL (2 mg/mL) infusion 30 mg/hr (06/18/23 0802)   heparin 1,750 Units/hr (06/18/23 0315)   iron sucrose (VENOFER) 200 mg in sodium chloride 0.9 % 100 mL IVPB     milrinone 0.25 mcg/kg/min (06/18/23 0751)   potassium chloride       LOS: 2 days    Time spent:    Zannie Cove, MD [facility]   06/18/2023, 9:57 AM

## 2023-06-18 NOTE — Plan of Care (Signed)

## 2023-06-19 DIAGNOSIS — I5023 Acute on chronic systolic (congestive) heart failure: Secondary | ICD-10-CM | POA: Diagnosis not present

## 2023-06-19 LAB — CBC
HCT: 39.4 % (ref 39.0–52.0)
Hemoglobin: 12.2 g/dL — ABNORMAL LOW (ref 13.0–17.0)
MCH: 23.3 pg — ABNORMAL LOW (ref 26.0–34.0)
MCHC: 31 g/dL (ref 30.0–36.0)
MCV: 75.2 fL — ABNORMAL LOW (ref 80.0–100.0)
Platelets: 262 10*3/uL (ref 150–400)
RBC: 5.24 MIL/uL (ref 4.22–5.81)
RDW: 17.9 % — ABNORMAL HIGH (ref 11.5–15.5)
WBC: 7.9 10*3/uL (ref 4.0–10.5)
nRBC: 0 % (ref 0.0–0.2)

## 2023-06-19 LAB — BASIC METABOLIC PANEL
Anion gap: 13 (ref 5–15)
Anion gap: 15 (ref 5–15)
BUN: 26 mg/dL — ABNORMAL HIGH (ref 6–20)
BUN: 27 mg/dL — ABNORMAL HIGH (ref 6–20)
CO2: 27 mmol/L (ref 22–32)
CO2: 28 mmol/L (ref 22–32)
Calcium: 8.7 mg/dL — ABNORMAL LOW (ref 8.9–10.3)
Calcium: 9.4 mg/dL (ref 8.9–10.3)
Chloride: 92 mmol/L — ABNORMAL LOW (ref 98–111)
Chloride: 94 mmol/L — ABNORMAL LOW (ref 98–111)
Creatinine, Ser: 1.45 mg/dL — ABNORMAL HIGH (ref 0.61–1.24)
Creatinine, Ser: 1.66 mg/dL — ABNORMAL HIGH (ref 0.61–1.24)
GFR, Estimated: 59 mL/min — ABNORMAL LOW (ref >60)
GFR, Estimated: 51 mL/min — ABNORMAL LOW (ref >60)
Glucose, Bld: 220 mg/dL — ABNORMAL HIGH (ref 70–99)
Glucose, Bld: 113 mg/dL — ABNORMAL HIGH (ref 70–99)
Potassium: 3.2 mmol/L — ABNORMAL LOW (ref 3.5–5.1)
Potassium: 3.8 mmol/L (ref 3.5–5.1)
Sodium: 132 mmol/L — ABNORMAL LOW (ref 135–145)
Sodium: 137 mmol/L (ref 135–145)

## 2023-06-19 LAB — MAGNESIUM: Magnesium: 2.1 mg/dL (ref 1.7–2.4)

## 2023-06-19 LAB — COOXEMETRY PANEL
Carboxyhemoglobin: 1.9 % — ABNORMAL HIGH (ref 0.5–1.5)
Carboxyhemoglobin: 2.4 % — ABNORMAL HIGH (ref 0.5–1.5)
Methemoglobin: <0.7 % (ref 0.0–1.5)
Methemoglobin: <0.7 % (ref 0.0–1.5)
O2 Saturation: 53.1 %
O2 Saturation: 46.5 %
Total hemoglobin: 12.6 g/dL (ref 12.0–16.0)
Total hemoglobin: 13.3 g/dL (ref 12.0–16.0)

## 2023-06-19 LAB — GLUCOSE, CAPILLARY
Glucose-Capillary: 138 mg/dL — ABNORMAL HIGH (ref 70–99)
Glucose-Capillary: 130 mg/dL — ABNORMAL HIGH (ref 70–99)
Glucose-Capillary: 123 mg/dL — ABNORMAL HIGH (ref 70–99)
Glucose-Capillary: 118 mg/dL — ABNORMAL HIGH (ref 70–99)

## 2023-06-19 LAB — HEPARIN LEVEL (UNFRACTIONATED): Heparin Unfractionated: 0.29 [IU]/mL — ABNORMAL LOW (ref 0.30–0.70)

## 2023-06-19 LAB — BRAIN NATRIURETIC PEPTIDE: B Natriuretic Peptide: 146.9 pg/mL — ABNORMAL HIGH (ref 0.0–100.0)

## 2023-06-19 MED ORDER — POTASSIUM CHLORIDE CRYS ER 20 MEQ PO TBCR
40.0000 meq | EXTENDED_RELEASE_TABLET | ORAL | Status: AC
  Administered 2023-06-19 (×2): 40 meq via ORAL
  Filled 2023-06-19 (×2): qty 2

## 2023-06-19 MED ORDER — ACETAZOLAMIDE 250 MG PO TABS
500.0000 mg | ORAL_TABLET | Freq: Once | ORAL | Status: AC
Start: 2023-06-19 — End: 2023-06-19
  Administered 2023-06-19: 500 mg via ORAL
  Filled 2023-06-19: qty 2

## 2023-06-19 MED ORDER — TEMAZEPAM 7.5 MG PO CAPS
7.5000 mg | ORAL_CAPSULE | Freq: Every evening | ORAL | Status: DC | PRN
  Administered 2023-06-19 – 2023-06-22 (×3): 7.5 mg via ORAL
  Filled 2023-06-19 (×3): qty 1

## 2023-06-19 MED ORDER — APIXABAN 5 MG PO TABS
5.0000 mg | ORAL_TABLET | Freq: Two times a day (BID) | ORAL | Status: DC
Start: 2023-06-19 — End: 2023-06-22
  Administered 2023-06-19 – 2023-06-22 (×7): 5 mg via ORAL
  Filled 2023-06-19 (×7): qty 1

## 2023-06-19 MED ORDER — POTASSIUM CHLORIDE 10 MEQ/50ML IV SOLN
10.0000 meq | INTRAVENOUS | Status: AC
  Administered 2023-06-19 (×2): 10 meq via INTRAVENOUS
  Filled 2023-06-19 (×2): qty 50

## 2023-06-19 MED ORDER — MELATONIN 3 MG PO TABS
3.0000 mg | ORAL_TABLET | Freq: Every day | ORAL | Status: DC
  Administered 2023-06-19 – 2023-06-21 (×3): 3 mg via ORAL
  Filled 2023-06-19 (×3): qty 1

## 2023-06-19 MED ORDER — POTASSIUM CHLORIDE CRYS ER 20 MEQ PO TBCR
40.0000 meq | EXTENDED_RELEASE_TABLET | Freq: Once | ORAL | Status: AC
  Administered 2023-06-19: 40 meq via ORAL
  Filled 2023-06-19: qty 2

## 2023-06-19 NOTE — Plan of Care (Signed)

## 2023-06-19 NOTE — Progress Notes (Signed)
 Advanced Heart Failure Rounding Note  Cardiologist: Chrystie Nose, MD  Chief Complaint: Biventricular Heart Failure  Subjective:    Coox 53% this morning off milrinone, CO 5.7 CI 1.9. CVP 10. 7.2L UOP yesterday, though slowing down today, weight significantly improved.  Will stop IV drip tonight, transition to bolus dosing tomorrow.  No TEE/DCCV yesterday with left atrial thormbus. Discussed need for repeat TEE/DCCV. Also discussed advanced heart failure dx and need for follow up.   Feels much better.   Objective:    Weight Range: (!) 171.7 kg Body mass index is 48.6 kg/m.   Vital Signs:   Temp:  [97.6 F (36.4 C)-98.5 F (36.9 C)] 97.6 F (36.4 C) (03/01 1025) Pulse Rate:  [84-137] 122 (03/01 1025) Resp:  [15-31] 16 (03/01 0730) BP: (76-138)/(45-96) 94/60 (03/01 1025) SpO2:  [89 %-96 %] 92 % (03/01 1025) Weight:  [171.7 kg] 171.7 kg (03/01 0357) Last BM Date : 06/18/23  Weight change: Filed Weights   06/17/23 0646 06/18/23 0441 06/19/23 0357  Weight: (!) 178.2 kg (!) 173 kg (!) 171.7 kg   Intake/Output:   Intake/Output Summary (Last 24 hours) at 06/19/2023 1133 Last data filed at 06/19/2023 1026 Gross per 24 hour  Intake 3095.59 ml  Output 4700 ml  Net -1604.41 ml    Physical Exam    CVP 9-10 General: Obese appearing. No distress on RA Cardiac: JVP to mid neck. S1 and S2 present. Tachy and irregular Abdomen: Obese, soft Extremities: Warm and dry. No rash, cyanosis.  2+ edema.  Neuro: Alert and oriented x3. Affect pleasant. Moves all extremities without difficulty. Lines/Devices:  LUE PICC  Telemetry   AF in 120-150s (personally reviewed)  EKG    N/A  Labs    CBC Recent Labs    06/18/23 0545 06/19/23 0424  WBC 11.2* 7.9  HGB 12.0* 12.2*  HCT 38.5* 39.4  MCV 75.0* 75.2*  PLT 264 262   Basic Metabolic Panel Recent Labs    86/57/84 0226 06/17/23 1400 06/18/23 0545 06/18/23 1444 06/19/23 0613  NA 134*   < > 129* 133* 132*  K 3.6    < > 2.7* 3.3* 3.2*  CL 99   < > 88* 89* 92*  CO2 19*   < > 25 26 27   GLUCOSE 166*   < > 382* 307* 220*  BUN 31*   < > 29* 27* 26*  CREATININE 1.87*   < > 1.68* 1.51* 1.45*  CALCIUM 9.0   < > 8.1* 8.7* 8.7*  MG 1.9  --  1.8  --  2.1  PHOS 3.7  --   --   --   --    < > = values in this interval not displayed.   Liver Function Tests Recent Labs    06/17/23 0226 06/18/23 0545  AST 28 28  ALT 39 37  ALKPHOS 57 53  BILITOT 1.1 1.1  PROT 7.1 6.8  ALBUMIN 3.6 3.5   No results for input(s): "LIPASE", "AMYLASE" in the last 72 hours. Cardiac Enzymes No results for input(s): "CKTOTAL", "CKMB", "CKMBINDEX", "TROPONINI" in the last 72 hours.  BNP: BNP (last 3 results) Recent Labs    06/17/23 0226 06/18/23 0545 06/19/23 0330  BNP 229.7* 97.7 146.9*    ProBNP (last 3 results) No results for input(s): "PROBNP" in the last 8760 hours.   D-Dimer No results for input(s): "DDIMER" in the last 72 hours. Hemoglobin A1C Recent Labs    06/18/23 0639  HGBA1C 6.5*  Fasting Lipid Panel Recent Labs    06/17/23 0226  CHOL 161  HDL 30*  LDLCALC 111*  TRIG 99  CHOLHDL 5.4   Thyroid Function Tests No results for input(s): "TSH", "T4TOTAL", "T3FREE", "THYROIDAB" in the last 72 hours.  Invalid input(s): "FREET3"   Other results:   Medications:    Scheduled Medications:  apixaban  5 mg Oral BID   Chlorhexidine Gluconate Cloth  6 each Topical Daily   empagliflozin  10 mg Oral Daily   insulin aspart  0-15 Units Subcutaneous TID WC   melatonin  3 mg Oral QHS   potassium chloride  40 mEq Oral Q2H   rosuvastatin  40 mg Oral Daily   sacubitril-valsartan  1 tablet Oral BID   sodium chloride flush  10-40 mL Intracatheter Q12H   spironolactone  25 mg Oral Daily   Infusions:  amiodarone 30 mg/hr (06/19/23 0907)   furosemide (LASIX) 200 mg in dextrose 5 % 100 mL (2 mg/mL) infusion 30 mg/hr (06/19/23 1030)   iron sucrose (VENOFER) 200 mg in sodium chloride 0.9 % 100 mL IVPB 200  mg (06/18/23 1500)   PRN Medications: acetaminophen **OR** acetaminophen, alum & mag hydroxide-simeth, docusate sodium, ondansetron **OR** ondansetron (ZOFRAN) IV, polyethylene glycol, sodium chloride flush, temazepam  Patient Profile   Cole Singleton is a 48 y.o. 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   Admitted with CHF exacerbation and AF RVR  Assessment/Plan   Acute on Chronic Biventricular Heart Failure - EF previously 35%, documented as NICM. Can not rule out ICM with metabolic syndrome. Never had LHC. Reports that his HF exacerbations are preceded by URIs, could be viral myocarditis, however symptoms of URI could be driven by HF.  - Nuclear Stress in 2019 showed moderate inferior defect consistent with prior infarct, though suspect attenuation artifact - Echo: EF 20%, gHK, no LV thrombus, severely reduced RV (newly reduced) - Given IV diltiazem with severe worsening of edema and urine output - Milrinone turned off 2/28, coox borderline but improving end organ function and urine output so will continue off - Continue Lasix 30 mg/hr, stop tonight and transition to bolus dosing - No bb with low output - Place unna boots - Will hold on LHC during admission given persistent atrial fibrillation, could consider in the future - Restarted on jardiance, entresto 24/26mg  BID, spironolactone 25mg  daily - Digoxin when K replete - BMP later today   Afib with RVR - Rates stable in the 120-130s - No cardioversion with LAA clot - Amiodarone gtt at 30/hr, transition to orals tomorrow - Start apixaban 5mg  BID  AKI - cardiorenal - Cr 1.4 on admit - Continues to improve with diuresis   Severe OSA Obesity - severe on sleep study in 2023 - noncompliant with  CPAP - Body mass index is 48.6 kg/m. - discussed need for weight loss   HTN - GDMT as above   HLD - LDL 111 - continue crestor 40 mg daily - continue asa 81 mg daily  Hypokalemia - K 3.2 this am - aggressive supp with IV and PO - goal K>4 - BMP this afternoon  Length of Stay: 3  Romie Minus, MD  06/19/2023, 11:33 AM  Advanced Heart Failure Team Pager [phone] (M-F; 7a - 5p)  Please contact [facility] for  night-coverage after hours (5p -7a ) and weekends on [email or web address]

## 2023-06-19 NOTE — Progress Notes (Signed)
 PHARMACY - ANTICOAGULATION CONSULT NOTE  Pharmacy Consult for heparin Indication:  Afib and mobile LAA thrombus  Labs: Recent Labs    06/16/23 0744 06/16/23 1106 06/17/23 0226 06/17/23 0933 06/17/23 1400 06/18/23 0545 06/18/23 1444 06/18/23 1800 06/19/23 0330 06/19/23 0424  HGB  --    < > 12.1*  --   --  12.0*  --   --   --  12.2*  HCT  --   --  38.3*  --   --  38.5*  --   --   --  39.4  PLT  --   --  283  --   --  264  --   --   --  262  HEPARINUNFRC  --   --  0.51   < >  --  0.21*  --  0.11* 0.29*  --   CREATININE  --    < > 1.87*  --  2.02* 1.68* 1.51*  --   --   --   TROPONINIHS 48*  --   --   --   --   --   --   --   --   --    < > = values in this interval not displayed.   Assessment: 48yo male subtherapeutic on heparin after rate change though approaching goal; no infusion issues or signs of bleeding per RN.  Goal of Therapy:  Heparin level 0.3-0.7 units/ml   Plan:  Increase heparin infusion by 1 unit/kgABW/hr to 2400 units/hr. Check level in 6 hours.   Vernard Gambles, PharmD, BCPS 06/19/2023 4:51 AM

## 2023-06-19 NOTE — Plan of Care (Signed)

## 2023-06-19 NOTE — Anesthesia Postprocedure Evaluation (Signed)
 Anesthesia Post Note  Patient: Cole Singleton  Procedure(s) Performed: TRANSESOPHAGEAL ECHOCARDIOGRAM     Patient location during evaluation: PACU Anesthesia Type: MAC Level of consciousness: awake and alert Pain management: pain level controlled Vital Signs Assessment: post-procedure vital signs reviewed and stable Respiratory status: spontaneous breathing, nonlabored ventilation, respiratory function stable and patient connected to nasal cannula oxygen Cardiovascular status: stable and blood pressure returned to baseline Postop Assessment: no apparent nausea or vomiting Anesthetic complications: no   No notable events documented.  Last Vitals:  Vitals:   06/19/23 0730 06/19/23 1025  BP: 99/66 94/60  Pulse: (!) 111 (!) 122  Resp: 16   Temp: 36.7 C 36.4 C  SpO2: 92% 92%    Last Pain:  Vitals:   06/19/23 1025  TempSrc: Oral  PainSc:                  Nelle Don [place] Firkus

## 2023-06-19 NOTE — Progress Notes (Signed)
 PROGRESS NOTE    Cole Singleton  ZOX:[id number] DOB: [birth year] DOA: 06/15/2023 PCP: Patient, [name]  48/M with morbid obesity, chronic systolic CHF, OSA, hypertension, fatty liver disease, admitted to [facility] with volume overload and AFib RVR -2D echo noted EF of 20%, severely reduced RV, transferred to [facility] -2/26 started empiric milrinone and Lasix gtt. and amiodarone gtt -2/28, TEE attempted, noted to have small thrombus in left atrium, cardioversion deferred ordered  Subjective: -Feels better overall, no events overnight, breathing and swelling improving  Assessment and Plan:  Acute on chronic systolic CHF, BiV failure -Previously with chronic systolic CHF, EF 81%, presumed NICM -Echo now with EF 20%, severely reduced RV -Diuresing well on Lasix gtt and milrinone, continue Aldactone, Jardiance and Entresto -9.1 L negative, creatinine improving -Out of bed to chair -Plan for right and left heart cath in few days  A-fib RVR -Remains tachycardic, currently on Amio gtt. and heparin gtt. -Cardioversion deferred with finding of left atrial thrombus on TEE yesterday  Left atrial thrombus -Noted on TEE 2/28, continue Eliquis  AKI -Suspected to be cardiorenal, now improving, continue milrinone and Lasix as above  Severe OSA, obesity BMI is 50.4 -Was unable to tolerate CPAP in the past -Recommended repeat sleep study and compliance with CPAP -Weight loss and lifestyle modification recommended  Hyperglycemia, DM2 -A1c 6.5, consistent with new diabetes -Now Jardiance -SSI  Mild leukocytosis -Possibly reactive, improved  Severe fatty liver disease -Albumin is 3.6 now  Hypokalemia Replaced  Hypomagnesemia Replace  DVT prophylaxis: Apixaban Code Status: Full code Family Communication: None present Disposition Plan: Home likely 3 to 4 days  Consultants:    Procedures:   Antimicrobials:    Objective: Vitals:   06/18/23 2309 06/19/23 0357  06/19/23 0730 06/19/23 1025  BP:  99/63 99/66 94/60   Pulse:  (!) 115 (!) 111 (!) 122  Resp:  19 16   Temp:  97.7 F (36.5 C) 98.1 F (36.7 C) 97.6 F (36.4 C)  TempSrc:  Oral Oral Oral  SpO2: 91% 91% 92% 92%  Weight:  (!) 171.7 kg    Height:        Intake/Output Summary (Last 24 hours) at 06/19/2023 1037 Last data filed at 06/19/2023 1026 Gross per 24 hour  Intake 3095.59 ml  Output 4700 ml  Net -1604.41 ml   Filed Weights   06/17/23 0646 06/18/23 0441 06/19/23 0357  Weight: (!) 178.2 kg (!) 173 kg (!) 171.7 kg    Examination:  General exam: Appears calm and comfortable obese chronically ill male HEENT: Positive JVD Respiratory system: Decreased breath sounds at the bases Cardiovascular system: S1 & S2 heard, irregular Abd: nondistended, soft and nontender.Normal bowel sounds heard. Central nervous system: Alert and oriented. No focal neurological deficits. Extremities: 2+ edema Skin: Multiple maculopapular lesions since scars throughout lower legs Psychiatry:  Mood & affect appropriate.     Data Reviewed:   CBC: Recent Labs  Lab 06/15/23 1935 06/17/23 0226 06/18/23 0545 06/19/23 0424  WBC 8.1 13.6* 11.2* 7.9  HGB 12.5* 12.1* 12.0* 12.2*  HCT 39.7 38.3* 38.5* 39.4  MCV 76.1* 75.5* 75.0* 75.2*  PLT 187 283 264 262   Basic Metabolic Panel: Recent Labs  Lab 06/16/23 1106 06/17/23 0226 06/17/23 1400 06/18/23 0545 06/18/23 1444 06/19/23 0613  NA 132* 134* 132* 129* 133* 132*  K 3.9 3.6 3.3* 2.7* 3.3* 3.2*  CL 99 99 95* 88* 89* 92*  CO2 20* 19* 23 25 26 27   GLUCOSE 148* 166*  293* 382* 307* 220*  BUN 28* 31* 33* 29* 27* 26*  CREATININE 1.81* 1.87* 2.02* 1.68* 1.51* 1.45*  CALCIUM 9.1 9.0 8.5* 8.1* 8.7* 8.7*  MG 2.1 1.9  --  1.8  --  2.1  PHOS  --  3.7  --   --   --   --    GFR: Estimated Creatinine Clearance: 104 mL/min (A) (by C-G formula based on SCr of 1.45 mg/dL (H)). Liver Function Tests: Recent Labs  Lab 06/16/23 1106 06/17/23 0226  06/18/23 0545  AST 27 28 28   ALT 41 39 37  ALKPHOS 60 57 53  BILITOT 1.7* 1.1 1.1  PROT 7.6 7.1 6.8  ALBUMIN 3.8 3.6 3.5   No results for input(s): "LIPASE", "AMYLASE" in the last 168 hours. No results for input(s): "AMMONIA" in the last 168 hours. Coagulation Profile: No results for input(s): "INR", "PROTIME" in the last 168 hours. Cardiac Enzymes: No results for input(s): "CKTOTAL", "CKMB", "CKMBINDEX", "TROPONINI" in the last 168 hours. BNP (last 3 results) No results for input(s): "PROBNP" in the last 8760 hours. HbA1C: Recent Labs    06/18/23 0639  HGBA1C 6.5*   CBG: Recent Labs  Lab 06/18/23 1206 06/18/23 2117 06/19/23 0604 06/19/23 1027  GLUCAP 148* 118* 138* 130*   Lipid Profile: Recent Labs    06/17/23 0226  CHOL 161  HDL 30*  LDLCALC 111*  TRIG 99  CHOLHDL 5.4   Thyroid Function Tests: No results for input(s): "TSH", "T4TOTAL", "FREET4", "T3FREE", "THYROIDAB" in the last 72 hours.  Anemia Panel: No results for input(s): "VITAMINB12", "FOLATE", "FERRITIN", "TIBC", "IRON", "RETICCTPCT" in the last 72 hours.  Urine analysis:    Component Value Date/Time   COLORURINE AMBER (A) 06/16/2023 0914   APPEARANCEUR HAZY (A) 06/16/2023 0914   LABSPEC 1.020 06/16/2023 0914   PHURINE 5.0 06/16/2023 0914   GLUCOSEU 150 (A) 06/16/2023 0914   HGBUR NEGATIVE 06/16/2023 0914   BILIRUBINUR NEGATIVE 06/16/2023 0914   KETONESUR NEGATIVE 06/16/2023 0914   PROTEINUR 100 (A) 06/16/2023 0914   NITRITE NEGATIVE 06/16/2023 0914   LEUKOCYTESUR NEGATIVE 06/16/2023 0914   Sepsis Labs: @LABRCNTIP (procalcitonin:4,lacticidven:4)  )No results found for this or any previous visit (from the past 240 hours).   Radiology Studies: ECHO TEE Result Date: 06/18/2023    TRANSESOPHOGEAL ECHO REPORT   Patient Name:   Cole Singleton Date of Exam: 06/18/2023 Medical Rec #:  [id number]        Height:       74.0 in Accession #:    [id number]       Weight:       381.4 lb Date of Birth:   1976-01-10        BSA:          2.860 m Patient Age:    48 years         BP:           90/71 mmHg Patient Gender: M                HR:           135 bpm. Exam Location:  Inpatient Procedure: Transesophageal Echo, Cardiac Doppler and Color Doppler (Both            Spectral and Color Flow Doppler were utilized during procedure). Indications:     Atrial Fibrillation  History:         Patient has prior history of Echocardiogram examinations, most  recent 06/16/2023. CHF and Cardiomyopathy, Arrythmias:Atrial                  Fibrillation; Risk Factors:Hypertension.  Sonographer:     Lucendia Herrlich RCS Referring Phys:  [id number] Romie Minus Diagnosing Phys: Clearnce Hasten PROCEDURE: After discussion of the risks and benefits of a TEE, an informed consent was obtained from the patient. The transesophogeal probe was passed without difficulty through the esophogus of the patient. Imaged were obtained with the patient in a left lateral decubitus position. Sedation performed by different physician. The patient was monitored while under deep sedation. The patient developed no complications during the procedure.  IMPRESSIONS  1. Septal shift into the RV consistent with left sided volume overload. Left ventricular ejection fraction, by estimation, is <20%. The left ventricle has severely decreased function. The left ventricle demonstrates global hypokinesis. The left ventricular internal cavity size was moderately dilated.  2. Right ventricular systolic function is moderately reduced. The right ventricular size is mildly enlarged. There is moderately elevated pulmonary artery systolic pressure. The estimated right ventricular systolic pressure is 56.0 mmHg.  3. Left atrial size was moderately dilated. A left atrial/left atrial appendage thrombus was detected. The LAA emptying velocity was 28 cm/s.  4. Right atrial size was moderately dilated.  5. Moderate secondary MR. The mitral valve is normal in  structure. Moderate mitral valve regurgitation. No evidence of mitral stenosis.  6. Tricuspid valve regurgitation is mild to moderate.  7. The aortic valve is tricuspid. Aortic valve regurgitation is not visualized.  8. The left upper pulmonary vein is abnormal. FINDINGS  Left Ventricle: Septal shift into the RV consistent with left sided volume overload. Left ventricular ejection fraction, by estimation, is <20%. The left ventricle has severely decreased function. The left ventricle demonstrates global hypokinesis. The left ventricular internal cavity size was moderately dilated. Right Ventricle: The right ventricular size is mildly enlarged. No increase in right ventricular wall thickness. Right ventricular systolic function is moderately reduced. There is moderately elevated pulmonary artery systolic pressure. The tricuspid regurgitant velocity is 3.20 m/s, and with an assumed right atrial pressure of 15 mmHg, the estimated right ventricular systolic pressure is 56.0 mmHg. Left Atrium: Left atrial size was moderately dilated. Spontaneous echo contrast was present in the left atrium. A left atrial/left atrial appendage thrombus was detected. The LAA emptying velocity was 28 cm/s. Right Atrium: Right atrial size was moderately dilated. Pericardium: There is no evidence of pericardial effusion. Mitral Valve: Moderate secondary MR. The mitral valve is normal in structure. Moderate mitral valve regurgitation. No evidence of mitral valve stenosis. Tricuspid Valve: The tricuspid valve is normal in structure. Tricuspid valve regurgitation is mild to moderate. Aortic Valve: The aortic valve is tricuspid. Aortic valve regurgitation is not visualized. Pulmonic Valve: The pulmonic valve was grossly normal. Pulmonic valve regurgitation is not visualized. Aorta: The aortic root, ascending aorta and aortic arch are all structurally normal, with no evidence of dilitation or obstruction. Venous: The left upper pulmonary vein is  abnormal. IAS/Shunts: No atrial level shunt detected by color flow Doppler. Additional Comments: Spectral Doppler performed. TRICUSPID VALVE TR Peak grad:   41.0 mmHg TR Vmax:        320.00 cm/s Clearnce Hasten Electronically signed by Clearnce Hasten Signature Date/Time: 06/18/2023/4:50:53 PM    Final    EP STUDY Result Date: 06/18/2023 See surgical note for result.    Scheduled Meds:  apixaban  5 mg Oral BID   Chlorhexidine Gluconate Cloth  6 each  Topical Daily   empagliflozin  10 mg Oral Daily   insulin aspart  0-15 Units Subcutaneous TID WC   melatonin  3 mg Oral QHS   potassium chloride  40 mEq Oral Q2H   rosuvastatin  40 mg Oral Daily   sacubitril-valsartan  1 tablet Oral BID   sodium chloride flush  10-40 mL Intracatheter Q12H   spironolactone  25 mg Oral Daily   Continuous Infusions:  amiodarone 30 mg/hr (06/19/23 0907)   furosemide (LASIX) 200 mg in dextrose 5 % 100 mL (2 mg/mL) infusion 30 mg/hr (06/19/23 1030)   iron sucrose (VENOFER) 200 mg in sodium chloride 0.9 % 100 mL IVPB 200 mg (06/18/23 1500)   potassium chloride 10 mEq (06/19/23 1019)     LOS: 3 days    Time spent:    Zannie Cove, MD [facility]   06/19/2023, 10:37 AM

## 2023-06-20 DIAGNOSIS — I5023 Acute on chronic systolic (congestive) heart failure: Secondary | ICD-10-CM | POA: Diagnosis not present

## 2023-06-20 LAB — BASIC METABOLIC PANEL
Anion gap: 16 — ABNORMAL HIGH (ref 5–15)
Anion gap: 10 (ref 5–15)
BUN: 31 mg/dL — ABNORMAL HIGH (ref 6–20)
BUN: 31 mg/dL — ABNORMAL HIGH (ref 6–20)
CO2: 26 mmol/L (ref 22–32)
CO2: 27 mmol/L (ref 22–32)
Calcium: 9.4 mg/dL (ref 8.9–10.3)
Calcium: 9.1 mg/dL (ref 8.9–10.3)
Chloride: 92 mmol/L — ABNORMAL LOW (ref 98–111)
Chloride: 97 mmol/L — ABNORMAL LOW (ref 98–111)
Creatinine, Ser: 1.6 mg/dL — ABNORMAL HIGH (ref 0.61–1.24)
Creatinine, Ser: 1.67 mg/dL — ABNORMAL HIGH (ref 0.61–1.24)
GFR, Estimated: 53 mL/min — ABNORMAL LOW (ref >60)
GFR, Estimated: 50 mL/min — ABNORMAL LOW (ref >60)
Glucose, Bld: 175 mg/dL — ABNORMAL HIGH (ref 70–99)
Glucose, Bld: 115 mg/dL — ABNORMAL HIGH (ref 70–99)
Potassium: 3.5 mmol/L (ref 3.5–5.1)
Potassium: 4.1 mmol/L (ref 3.5–5.1)
Sodium: 134 mmol/L — ABNORMAL LOW (ref 135–145)
Sodium: 134 mmol/L — ABNORMAL LOW (ref 135–145)

## 2023-06-20 LAB — COOXEMETRY PANEL
Carboxyhemoglobin: 1.9 % — ABNORMAL HIGH (ref 0.5–1.5)
Carboxyhemoglobin: 1.4 % (ref 0.5–1.5)
Methemoglobin: <0.7 % (ref 0.0–1.5)
Methemoglobin: <0.7 % (ref 0.0–1.5)
O2 Saturation: 51.2 %
O2 Saturation: 48.3 %
Total hemoglobin: 12.9 g/dL (ref 12.0–16.0)
Total hemoglobin: 13.9 g/dL (ref 12.0–16.0)

## 2023-06-20 LAB — GLUCOSE, CAPILLARY
Glucose-Capillary: 118 mg/dL — ABNORMAL HIGH (ref 70–99)
Glucose-Capillary: 98 mg/dL (ref 70–99)
Glucose-Capillary: 113 mg/dL — ABNORMAL HIGH (ref 70–99)
Glucose-Capillary: 151 mg/dL — ABNORMAL HIGH (ref 70–99)

## 2023-06-20 LAB — CBC
HCT: 40.7 % (ref 39.0–52.0)
Hemoglobin: 12.6 g/dL — ABNORMAL LOW (ref 13.0–17.0)
MCH: 23.6 pg — ABNORMAL LOW (ref 26.0–34.0)
MCHC: 31 g/dL (ref 30.0–36.0)
MCV: 76.2 fL — ABNORMAL LOW (ref 80.0–100.0)
Platelets: 238 10*3/uL (ref 150–400)
RBC: 5.34 MIL/uL (ref 4.22–5.81)
RDW: 18.1 % — ABNORMAL HIGH (ref 11.5–15.5)
WBC: 7.3 10*3/uL (ref 4.0–10.5)
nRBC: 0 % (ref 0.0–0.2)

## 2023-06-20 LAB — MAGNESIUM: Magnesium: 2.3 mg/dL (ref 1.7–2.4)

## 2023-06-20 MED ORDER — DIGOXIN 125 MCG PO TABS
0.1250 mg | ORAL_TABLET | Freq: Every day | ORAL | Status: DC
  Administered 2023-06-20 – 2023-06-22 (×3): 0.125 mg via ORAL
  Filled 2023-06-20 (×3): qty 1

## 2023-06-20 MED ORDER — AMIODARONE HCL 200 MG PO TABS
400.0000 mg | ORAL_TABLET | Freq: Two times a day (BID) | ORAL | Status: DC
Start: 2023-06-20 — End: 2023-06-22
  Administered 2023-06-20 – 2023-06-22 (×5): 400 mg via ORAL
  Filled 2023-06-20 (×5): qty 2

## 2023-06-20 MED ORDER — PANTOPRAZOLE SODIUM 40 MG PO TBEC
40.0000 mg | DELAYED_RELEASE_TABLET | Freq: Every day | ORAL | Status: DC
  Administered 2023-06-20 – 2023-06-21 (×2): 40 mg via ORAL
  Filled 2023-06-20 (×2): qty 1

## 2023-06-20 MED ORDER — POTASSIUM CHLORIDE CRYS ER 20 MEQ PO TBCR
40.0000 meq | EXTENDED_RELEASE_TABLET | Freq: Three times a day (TID) | ORAL | Status: AC
  Administered 2023-06-20 (×3): 40 meq via ORAL
  Filled 2023-06-20 (×3): qty 2

## 2023-06-20 MED ORDER — FUROSEMIDE 10 MG/ML IJ SOLN
160.0000 mg | Freq: Two times a day (BID) | INTRAVENOUS | Status: DC
  Administered 2023-06-20 – 2023-06-21 (×3): 160 mg via INTRAVENOUS
  Filled 2023-06-20 (×2): qty 16
  Filled 2023-06-20: qty 10
  Filled 2023-06-20: qty 16

## 2023-06-20 NOTE — Progress Notes (Signed)
 Advanced Heart Failure Rounding Note  Cardiologist: Chrystie Nose, MD  Chief Complaint: Biventricular Heart Failure  Subjective:    Coox 51% this morning, borderline cardiac output. Converted to normal sinus rhythm this morning as well. Urine output adequate and weight improving. Will given 2 doses IV lasix today and hopefully transition to orals tomorrow.   Will start digoxin today, hold on further advancing GDMT given soft blood pressures.   Feels much better.   Objective:    Weight Range: (!) 169.6 kg Body mass index is 48.01 kg/m.   Vital Signs:   Temp:  [97.8 F (36.6 C)-100 F (37.8 C)] 97.8 F (36.6 C) (03/02 0753) Pulse Rate:  [84-116] 84 (03/02 1040) Resp:  [16-27] 18 (03/02 0617) BP: (93-126)/(69-96) 93/69 (03/02 1040) SpO2:  [93 %-96 %] 94 % (03/02 1040) Weight:  [169.6 kg] 169.6 kg (03/02 0530) Last BM Date : 06/18/23  Weight change: Filed Weights   06/18/23 0441 06/19/23 0357 06/20/23 0530  Weight: (!) 173 kg (!) 171.7 kg (!) 169.6 kg   Intake/Output:   Intake/Output Summary (Last 24 hours) at 06/20/2023 1215 Last data filed at 06/20/2023 1043 Gross per 24 hour  Intake 370 ml  Output 3225 ml  Net -2855 ml    Physical Exam    CVP 8-10 General: Obese appearing. No distress on RA Cardiac: JVP to mid neck. S1 and S2 present. Regular rate and rhythm Abdomen: Obese, soft Extremities: Warm and dry. No rash, cyanosis.  2+ edema.  Neuro: Alert and oriented x3. Affect pleasant. Moves all extremities without difficulty. Lines/Devices:  LUE PICC  Telemetry   Sinus rhythm in the 80s  EKG    N/A  Labs    CBC Recent Labs    06/19/23 0424 06/20/23 0500  WBC 7.9 7.3  HGB 12.2* 12.6*  HCT 39.4 40.7  MCV 75.2* 76.2*  PLT 262 238   Basic Metabolic Panel Recent Labs    29/56/21 0613 06/19/23 1603 06/20/23 0500  NA 132* 137 134*  K 3.2* 3.8 3.5  CL 92* 94* 92*  CO2 27 28 26   GLUCOSE 220* 113* 175*  BUN 26* 27* 31*  CREATININE 1.45*  1.66* 1.60*  CALCIUM 8.7* 9.4 9.4  MG 2.1  --  2.3   Liver Function Tests Recent Labs    06/18/23 0545  AST 28  ALT 37  ALKPHOS 53  BILITOT 1.1  PROT 6.8  ALBUMIN 3.5   No results for input(s): "LIPASE", "AMYLASE" in the last 72 hours. Cardiac Enzymes No results for input(s): "CKTOTAL", "CKMB", "CKMBINDEX", "TROPONINI" in the last 72 hours.  BNP: BNP (last 3 results) Recent Labs    06/17/23 0226 06/18/23 0545 06/19/23 0330  BNP 229.7* 97.7 146.9*    ProBNP (last 3 results) No results for input(s): "PROBNP" in the last 8760 hours.   D-Dimer No results for input(s): "DDIMER" in the last 72 hours. Hemoglobin A1C Recent Labs    06/18/23 0639  HGBA1C 6.5*   Fasting Lipid Panel No results for input(s): "CHOL", "HDL", "LDLCALC", "TRIG", "CHOLHDL", "LDLDIRECT" in the last 72 hours.  Thyroid Function Tests No results for input(s): "TSH", "T4TOTAL", "T3FREE", "THYROIDAB" in the last 72 hours.  Invalid input(s): "FREET3"   Other results:   Medications:    Scheduled Medications:  amiodarone  400 mg Oral BID   apixaban  5 mg Oral BID   Chlorhexidine Gluconate Cloth  6 each Topical Daily   digoxin  0.125 mg Oral Daily  empagliflozin  10 mg Oral Daily   insulin aspart  0-15 Units Subcutaneous TID WC   melatonin  3 mg Oral QHS   pantoprazole  40 mg Oral Q1200   potassium chloride  40 mEq Oral TID   rosuvastatin  40 mg Oral Daily   sacubitril-valsartan  1 tablet Oral BID   sodium chloride flush  10-40 mL Intracatheter Q12H   spironolactone  25 mg Oral Daily   Infusions:  furosemide 160 mg (06/20/23 1006)   PRN Medications: acetaminophen **OR** acetaminophen, alum & mag hydroxide-simeth, docusate sodium, ondansetron **OR** ondansetron (ZOFRAN) IV, polyethylene glycol, sodium chloride flush, temazepam  Patient Profile   Cole Singleton is a 48 y.o. male with a hx of chronic HFrEF with presumed NICM, morbid obesity, OSA intolerant of CPAP, HTN, mild  aneurysmal dilation of aorta previously seen by CT 2019, cholelithiasis, 3mm pulm nodule, severe hepatic steatosis, colonic diverticulosis, ventral hernia by CT 2021.   Admitted with CHF exacerbation and AF RVR  Assessment/Plan   Acute on Chronic Biventricular Heart Failure - EF previously 35%, documented as NICM. Can not rule out ICM with metabolic syndrome. Never had LHC.  - Nuclear Stress in 2019 showed moderate inferior defect consistent with prior infarct, though suspect attenuation artifact - Echo: EF 20%, gHK, no LV thrombus, severely reduced RV (newly reduced) - Given IV diltiazem with severe worsening of edema and urine output - Milrinone turned off 2/28, coox borderline but improving end organ function and urine output so will continue off - Start digoxin 0.125mg  daily - IV lasix 160mg  IV x2, transition to orals tomorrow - No bb with low output - Place unna boots - Will hold on LHC during admission given recent conversion, can consider as outpatient - Restarted on jardiance, entresto 24/26mg  BID, spironolactone 25mg  daily   Afib with RVR - Conversion on amiodarone. Theoretical risk of clot migration with chemical conversion, but has been adequately anticoagulated. Discussed alarm symptoms  - Transition to oral amiodarone - Continue apixaban 5mg  BID  AKI - cardiorenal - Cr 1.4 on admit - Mildly elevated today, will start oral diuresis tomorrow   Severe OSA Obesity - severe on sleep study in 2023 - noncompliant with CPAP - Body mass index is 48.01 kg/m. - discussed need for weight loss   HTN - GDMT as above   HLD - LDL 111 - continue crestor 40 mg daily - continue asa 81 mg daily  Hypokalemia - K 3.5 this am - aggressive supp with IV and PO - goal K>4  Length of Stay: 4  Romie Minus, MD  06/20/2023, 12:15 PM  Advanced Heart Failure Team Pager [phone] (M-F; 7a - 5p)  Please contact [facility] for night-coverage after hours (5p -7a ) and  weekends on [email or web address]

## 2023-06-20 NOTE — Progress Notes (Addendum)
 PROGRESS NOTE    Cole Singleton  MWU:[id number] DOB: Apr 14, 1976 DOA: 06/15/2023 PCP: Patient, [name]  48/M with morbid obesity, chronic systolic CHF, OSA, hypertension, fatty liver disease, admitted to [facility] with volume overload and AFib RVR -2D echo noted EF of 20%, severely reduced RV, transferred to [facility] -2/26 started empiric milrinone and Lasix gtt. and amiodarone gtt -2/28, TEE attempted, noted to have small thrombus in left atrium, cardioversion deferred  -3/1: Milrinone discontinued  Subjective: -Feels well, converted to sinus rhythm overnight  Assessment and Plan:  Acute on chronic systolic CHF, BiV failure -Previously with chronic systolic CHF, EF 72%, presumed NICM -Echo now with EF 20%, severely reduced RV -Diuresing well on Lasix gtt and milrinone, continue Aldactone, Jardiance and Entresto - 13 L negative, creatinine plateauing, milrinone discontinued -?  Transition to oral diuretics -Right and left heart cath?  Tomorrow  A-fib RVR -Converted to sinus rhythm overnight, changed to oral amiodarone and Eliquis -Cardioversion deferred with finding of left atrial thrombus on TEE 2/28  Left atrial thrombus -Noted on TEE 2/28, continue Eliquis  AKI -Suspected to be cardiorenal, now improving, continue milrinone and Lasix as above  Severe OSA, obesity BMI is 50.4 -Was unable to tolerate CPAP in the past -Recommended repeat sleep study and compliance with CPAP -Weight loss and lifestyle modification recommended  Hyperglycemia, DM2 -A1c 6.5, consistent with new diabetes -Now Jardiance -SSI  Mild leukocytosis -Possibly reactive, improved  Severe fatty liver disease -Albumin is 3.6 now  Hypokalemia Replaced  Hypomagnesemia Replace  DVT prophylaxis: Apixaban Code Status: Full code Family Communication: None present Disposition Plan: Home likely 1 to 2 days  Consultants:    Procedures:   Antimicrobials:    Objective: Vitals:    06/20/23 0530 06/20/23 0617 06/20/23 0753 06/20/23 0856  BP: 110/76     Pulse:   88 88  Resp: (!) 27 18    Temp: 99 F (37.2 C)  97.8 F (36.6 C)   TempSrc: Axillary  Oral   SpO2:      Weight: (!) 169.6 kg     Height:        Intake/Output Summary (Last 24 hours) at 06/20/2023 1007 Last data filed at 06/20/2023 0809 Gross per 24 hour  Intake 370 ml  Output 3425 ml  Net -3055 ml   Filed Weights   06/18/23 0441 06/19/23 0357 06/20/23 0530  Weight: (!) 173 kg (!) 171.7 kg (!) 169.6 kg    Examination:  General exam: Appears calm and comfortable obese chronically ill male HEENT: Positive JVD CVS: S1-S2, regular rhythm Lungs: Decreased breath sounds to bases Abdomen: Soft, nontender, bowel sounds present Extremities: 1+ edema Skin: Multiple maculopapular lesions since scars throughout lower legs Psychiatry:  Mood & affect appropriate.     Data Reviewed:   CBC: Recent Labs  Lab 06/15/23 1935 06/17/23 0226 06/18/23 0545 06/19/23 0424 06/20/23 0500  WBC 8.1 13.6* 11.2* 7.9 7.3  HGB 12.5* 12.1* 12.0* 12.2* 12.6*  HCT 39.7 38.3* 38.5* 39.4 40.7  MCV 76.1* 75.5* 75.0* 75.2* 76.2*  PLT 187 283 264 262 238   Basic Metabolic Panel: Recent Labs  Lab 06/16/23 1106 06/17/23 0226 06/17/23 1400 06/18/23 0545 06/18/23 1444 06/19/23 0613 06/19/23 1603 06/20/23 0500  NA 132* 134*   < > 129* 133* 132* 137 134*  K 3.9 3.6   < > 2.7* 3.3* 3.2* 3.8 3.5  CL 99 99   < > 88* 89* 92* 94* 92*  CO2 20* 19*   < >  25 26 27 28 26   GLUCOSE 148* 166*   < > 382* 307* 220* 113* 175*  BUN 28* 31*   < > 29* 27* 26* 27* 31*  CREATININE 1.81* 1.87*   < > 1.68* 1.51* 1.45* 1.66* 1.60*  CALCIUM 9.1 9.0   < > 8.1* 8.7* 8.7* 9.4 9.4  MG 2.1 1.9  --  1.8  --  2.1  --  2.3  PHOS  --  3.7  --   --   --   --   --   --    < > = values in this interval not displayed.   GFR: Estimated Creatinine Clearance: 93.6 mL/min (A) (by C-G formula based on SCr of 1.6 mg/dL (H)). Liver Function  Tests: Recent Labs  Lab 06/16/23 1106 06/17/23 0226 06/18/23 0545  AST 27 28 28   ALT 41 39 37  ALKPHOS 60 57 53  BILITOT 1.7* 1.1 1.1  PROT 7.6 7.1 6.8  ALBUMIN 3.8 3.6 3.5   No results for input(s): "LIPASE", "AMYLASE" in the last 168 hours. No results for input(s): "AMMONIA" in the last 168 hours. Coagulation Profile: No results for input(s): "INR", "PROTIME" in the last 168 hours. Cardiac Enzymes: No results for input(s): "CKTOTAL", "CKMB", "CKMBINDEX", "TROPONINI" in the last 168 hours. BNP (last 3 results) No results for input(s): "PROBNP" in the last 8760 hours. HbA1C: Recent Labs    06/18/23 0639  HGBA1C 6.5*   CBG: Recent Labs  Lab 06/19/23 0604 06/19/23 1027 06/19/23 1551 06/19/23 2030 06/20/23 0619  GLUCAP 138* 130* 123* 118* 118*   Lipid Profile: No results for input(s): "CHOL", "HDL", "LDLCALC", "TRIG", "CHOLHDL", "LDLDIRECT" in the last 72 hours.  Thyroid Function Tests: No results for input(s): "TSH", "T4TOTAL", "FREET4", "T3FREE", "THYROIDAB" in the last 72 hours.  Anemia Panel: No results for input(s): "VITAMINB12", "FOLATE", "FERRITIN", "TIBC", "IRON", "RETICCTPCT" in the last 72 hours.  Urine analysis:    Component Value Date/Time   COLORURINE AMBER (A) 06/16/2023 0914   APPEARANCEUR HAZY (A) 06/16/2023 0914   LABSPEC 1.020 06/16/2023 0914   PHURINE 5.0 06/16/2023 0914   GLUCOSEU 150 (A) 06/16/2023 0914   HGBUR NEGATIVE 06/16/2023 0914   BILIRUBINUR NEGATIVE 06/16/2023 0914   KETONESUR NEGATIVE 06/16/2023 0914   PROTEINUR 100 (A) 06/16/2023 0914   NITRITE NEGATIVE 06/16/2023 0914   LEUKOCYTESUR NEGATIVE 06/16/2023 0914   Sepsis Labs: @LABRCNTIP (procalcitonin:4,lacticidven:4)  )No results found for this or any previous visit (from the past 240 hours).   Radiology Studies: ECHO TEE Result Date: 06/18/2023    TRANSESOPHOGEAL ECHO REPORT   Patient Name:   Cole Singleton Date of Exam: 06/18/2023 Medical Rec #:  [id number]        Height:        74.0 in Accession #:    [id number]       Weight:       381.4 lb Date of Birth:  1975-09-03        BSA:          2.860 m Patient Age:    48 years         BP:           90/71 mmHg Patient Gender: M                HR:           135 bpm. Exam Location:  Inpatient Procedure: Transesophageal Echo, Cardiac Doppler and Color Doppler (Both  Spectral and Color Flow Doppler were utilized during procedure). Indications:     Atrial Fibrillation  History:         Patient has prior history of Echocardiogram examinations, most                  recent 06/16/2023. CHF and Cardiomyopathy, Arrythmias:Atrial                  Fibrillation; Risk Factors:Hypertension.  Sonographer:     Lucendia Herrlich RCS Referring Phys:  [id number] Romie Minus Diagnosing Phys: Clearnce Hasten PROCEDURE: After discussion of the risks and benefits of a TEE, an informed consent was obtained from the patient. The transesophogeal probe was passed without difficulty through the esophogus of the patient. Imaged were obtained with the patient in a left lateral decubitus position. Sedation performed by different physician. The patient was monitored while under deep sedation. The patient developed no complications during the procedure.  IMPRESSIONS  1. Septal shift into the RV consistent with left sided volume overload. Left ventricular ejection fraction, by estimation, is <20%. The left ventricle has severely decreased function. The left ventricle demonstrates global hypokinesis. The left ventricular internal cavity size was moderately dilated.  2. Right ventricular systolic function is moderately reduced. The right ventricular size is mildly enlarged. There is moderately elevated pulmonary artery systolic pressure. The estimated right ventricular systolic pressure is 56.0 mmHg.  3. Left atrial size was moderately dilated. A left atrial/left atrial appendage thrombus was detected. The LAA emptying velocity was 28 cm/s.  4. Right atrial size was  moderately dilated.  5. Moderate secondary MR. The mitral valve is normal in structure. Moderate mitral valve regurgitation. No evidence of mitral stenosis.  6. Tricuspid valve regurgitation is mild to moderate.  7. The aortic valve is tricuspid. Aortic valve regurgitation is not visualized.  8. The left upper pulmonary vein is abnormal. FINDINGS  Left Ventricle: Septal shift into the RV consistent with left sided volume overload. Left ventricular ejection fraction, by estimation, is <20%. The left ventricle has severely decreased function. The left ventricle demonstrates global hypokinesis. The left ventricular internal cavity size was moderately dilated. Right Ventricle: The right ventricular size is mildly enlarged. No increase in right ventricular wall thickness. Right ventricular systolic function is moderately reduced. There is moderately elevated pulmonary artery systolic pressure. The tricuspid regurgitant velocity is 3.20 m/s, and with an assumed right atrial pressure of 15 mmHg, the estimated right ventricular systolic pressure is 56.0 mmHg. Left Atrium: Left atrial size was moderately dilated. Spontaneous echo contrast was present in the left atrium. A left atrial/left atrial appendage thrombus was detected. The LAA emptying velocity was 28 cm/s. Right Atrium: Right atrial size was moderately dilated. Pericardium: There is no evidence of pericardial effusion. Mitral Valve: Moderate secondary MR. The mitral valve is normal in structure. Moderate mitral valve regurgitation. No evidence of mitral valve stenosis. Tricuspid Valve: The tricuspid valve is normal in structure. Tricuspid valve regurgitation is mild to moderate. Aortic Valve: The aortic valve is tricuspid. Aortic valve regurgitation is not visualized. Pulmonic Valve: The pulmonic valve was grossly normal. Pulmonic valve regurgitation is not visualized. Aorta: The aortic root, ascending aorta and aortic arch are all structurally normal, with no  evidence of dilitation or obstruction. Venous: The left upper pulmonary vein is abnormal. IAS/Shunts: No atrial level shunt detected by color flow Doppler. Additional Comments: Spectral Doppler performed. TRICUSPID VALVE TR Peak grad:   41.0 mmHg TR Vmax:  320.00 cm/s Clearnce Hasten Electronically signed by Clearnce Hasten Signature Date/Time: 06/18/2023/4:50:53 PM    Final    EP STUDY Result Date: 06/18/2023 See surgical note for result.    Scheduled Meds:  amiodarone  400 mg Oral BID   apixaban  5 mg Oral BID   Chlorhexidine Gluconate Cloth  6 each Topical Daily   digoxin  0.125 mg Oral Daily   empagliflozin  10 mg Oral Daily   insulin aspart  0-15 Units Subcutaneous TID WC   melatonin  3 mg Oral QHS   potassium chloride  40 mEq Oral TID   rosuvastatin  40 mg Oral Daily   sacubitril-valsartan  1 tablet Oral BID   sodium chloride flush  10-40 mL Intracatheter Q12H   spironolactone  25 mg Oral Daily   Continuous Infusions:  furosemide 160 mg (06/20/23 1006)     LOS: 4 days    Time spent:    Zannie Cove, MD [facility]   06/20/2023, 10:07 AM

## 2023-06-21 DIAGNOSIS — I5023 Acute on chronic systolic (congestive) heart failure: Secondary | ICD-10-CM | POA: Diagnosis not present

## 2023-06-21 LAB — CBC
HCT: 43.2 % (ref 39.0–52.0)
Hemoglobin: 13.4 g/dL (ref 13.0–17.0)
MCH: 23.7 pg — ABNORMAL LOW (ref 26.0–34.0)
MCHC: 31 g/dL (ref 30.0–36.0)
MCV: 76.3 fL — ABNORMAL LOW (ref 80.0–100.0)
Platelets: 243 10*3/uL (ref 150–400)
RBC: 5.66 MIL/uL (ref 4.22–5.81)
RDW: 18.5 % — ABNORMAL HIGH (ref 11.5–15.5)
WBC: 8.2 10*3/uL (ref 4.0–10.5)
nRBC: 0 % (ref 0.0–0.2)

## 2023-06-21 LAB — GLUCOSE, CAPILLARY
Glucose-Capillary: 122 mg/dL — ABNORMAL HIGH (ref 70–99)
Glucose-Capillary: 105 mg/dL — ABNORMAL HIGH (ref 70–99)
Glucose-Capillary: 124 mg/dL — ABNORMAL HIGH (ref 70–99)
Glucose-Capillary: 110 mg/dL — ABNORMAL HIGH (ref 70–99)
Glucose-Capillary: 133 mg/dL — ABNORMAL HIGH (ref 70–99)

## 2023-06-21 LAB — MAGNESIUM: Magnesium: 2.3 mg/dL (ref 1.7–2.4)

## 2023-06-21 LAB — BASIC METABOLIC PANEL
Anion gap: 13 (ref 5–15)
BUN: 31 mg/dL — ABNORMAL HIGH (ref 6–20)
CO2: 25 mmol/L (ref 22–32)
Calcium: 9.2 mg/dL (ref 8.9–10.3)
Chloride: 97 mmol/L — ABNORMAL LOW (ref 98–111)
Creatinine, Ser: 1.51 mg/dL — ABNORMAL HIGH (ref 0.61–1.24)
GFR, Estimated: 57 mL/min — ABNORMAL LOW (ref >60)
Glucose, Bld: 108 mg/dL — ABNORMAL HIGH (ref 70–99)
Potassium: 4.1 mmol/L (ref 3.5–5.1)
Sodium: 135 mmol/L (ref 135–145)

## 2023-06-21 LAB — COOXEMETRY PANEL
Carboxyhemoglobin: 1.9 % — ABNORMAL HIGH (ref 0.5–1.5)
Methemoglobin: 1 % (ref 0.0–1.5)
O2 Saturation: 68.8 %
Total hemoglobin: 13.7 g/dL (ref 12.0–16.0)

## 2023-06-21 LAB — LIPOPROTEIN A (LPA): Lipoprotein (a): 73 nmol/L — ABNORMAL HIGH (ref <75.0)

## 2023-06-21 MED ORDER — TORSEMIDE 20 MG PO TABS
40.0000 mg | ORAL_TABLET | Freq: Two times a day (BID) | ORAL | Status: DC
  Administered 2023-06-21 – 2023-06-22 (×2): 40 mg via ORAL
  Filled 2023-06-21 (×2): qty 2

## 2023-06-21 NOTE — TOC Progression Note (Signed)
 Transition of Care [facility]) - Progression Note    Patient Details  Name: Cole Singleton MRN: [id number] Date of Birth: 02/07/1976  Transition of [facility]) CM/SW Contact  Nicanor Bake Phone Number: [phone] 06/21/2023, 11:12 AM  Clinical Narrative:   [facility] CSW called to schedule hospital followup appointment for Monday, July 19, 2023 at 3:30 PM.   CSW spoke with pt over the phone and he stated that his mother or father would be providing transportation home.   TOC will continue following.     Expected Discharge Plan: Home/Self Care Barriers to Discharge: Continued Medical Work up  Expected Discharge Plan and Services   Discharge Planning Services: CM Consult   Living arrangements for the past 2 months: Single Family Home                                       Social Determinants of Health (SDOH) Interventions SDOH Screenings   Food Insecurity: No Food Insecurity (06/16/2023)  Housing: Low Risk  (06/16/2023)  Transportation Needs: No Transportation Needs (06/16/2023)  Utilities: Not At Risk (06/16/2023)  Tobacco Use: High Risk (06/15/2023)    Readmission Risk Interventions     No data to display

## 2023-06-21 NOTE — Progress Notes (Signed)
 PROGRESS NOTE    Cole Singleton  ZOX:[id number] DOB: October 21, 1975 DOA: 06/15/2023 PCP: Patient, [name]  48/M with morbid obesity, chronic systolic CHF, OSA, hypertension, fatty liver disease, admitted to [facility] with volume overload and AFib RVR -2D echo noted EF of 20%, severely reduced RV, transferred to [facility] -2/26 started empiric milrinone and Lasix gtt. and amiodarone gtt -2/28, TEE attempted, noted to have small thrombus in left atrium, cardioversion deferred  -3/1: Milrinone discontinued -3/3, switch to oral torsemide  Subjective: -Feels okay, no events overnight  Assessment and Plan:  Acute on chronic systolic CHF, BiV failure -Previously with chronic systolic CHF, EF 81%, presumed NICM -Echo now with EF 20%, severely reduced RV -Diuresed well on Lasix gtt and milrinone, continue Aldactone, Jardiance and Entresto -16 L negative, volume status has improved, now off milrinone, switching to oral diuretics today -Plan to consider LHC as outpatient  A-fib RVR -Converted to sinus rhythm 3/1, changed to oral amiodarone and Eliquis -Cardioversion was considered initially and then deferred with finding of left atrial thrombus on TEE 2/28  Left atrial thrombus -Noted on TEE 2/28, continue Eliquis  AKI -Suspected to be cardiorenal, now improving, continue milrinone and Lasix as above  Severe OSA, obesity BMI is 50.4 -Was unable to tolerate CPAP in the past -Recommended repeat sleep study and compliance with CPAP -Weight loss and lifestyle modification recommended  Hyperglycemia, DM2 -A1c 6.5, consistent with new diabetes -Now Jardiance -SSI  Mild leukocytosis -Possibly reactive, improved  Severe fatty liver disease -Albumin is 3.6 now  Hypokalemia Replaced  Hypomagnesemia Replace  DVT prophylaxis: Apixaban Code Status: Full code Family Communication: None present Disposition Plan: Home likely tomorrow  Consultants:    Procedures:    Antimicrobials:    Objective: Vitals:   06/20/23 2018 06/21/23 0557 06/21/23 0727 06/21/23 0823  BP: (!) 116/92 110/79    Pulse: 83   81  Resp: 19 20    Temp: 97.6 F (36.4 C) 98.1 F (36.7 C)    TempSrc: Oral Axillary    SpO2:      Weight:   (!) 167.4 kg   Height:        Intake/Output Summary (Last 24 hours) at 06/21/2023 0935 Last data filed at 06/21/2023 0824 Gross per 24 hour  Intake 1406.43 ml  Output 4400 ml  Net -2993.57 ml   Filed Weights   06/19/23 0357 06/20/23 0530 06/21/23 0727  Weight: (!) 171.7 kg (!) 169.6 kg (!) 167.4 kg    Examination:  Gen: Awake, Alert, Oriented X 3,  HEENT: Neck obese unable to assess JVD Lungs: Good air movement bilaterally, CTAB CVS: S1S2/RRR Abd: soft, Non tender, non distended, BS present Extremities: Trace edema Skin: no new rashes on exposed skin     Data Reviewed:   CBC: Recent Labs  Lab 06/17/23 0226 06/18/23 0545 06/19/23 0424 06/20/23 0500 06/21/23 0500  WBC 13.6* 11.2* 7.9 7.3 8.2  HGB 12.1* 12.0* 12.2* 12.6* 13.4  HCT 38.3* 38.5* 39.4 40.7 43.2  MCV 75.5* 75.0* 75.2* 76.2* 76.3*  PLT 283 264 262 238 243   Basic Metabolic Panel: Recent Labs  Lab 06/17/23 0226 06/17/23 1400 06/18/23 0545 06/18/23 1444 06/19/23 0613 06/19/23 1603 06/20/23 0500 06/20/23 1435 06/21/23 0500  NA 134*   < > 129*   < > 132* 137 134* 134* 135  K 3.6   < > 2.7*   < > 3.2* 3.8 3.5 4.1 4.1  CL 99   < > 88*   < >  92* 94* 92* 97* 97*  CO2 19*   < > 25   < > 27 28 26 27 25   GLUCOSE 166*   < > 382*   < > 220* 113* 175* 115* 108*  BUN 31*   < > 29*   < > 26* 27* 31* 31* 31*  CREATININE 1.87*   < > 1.68*   < > 1.45* 1.66* 1.60* 1.67* 1.51*  CALCIUM 9.0   < > 8.1*   < > 8.7* 9.4 9.4 9.1 9.2  MG 1.9  --  1.8  --  2.1  --  2.3  --  2.3  PHOS 3.7  --   --   --   --   --   --   --   --    < > = values in this interval not displayed.   GFR: Estimated Creatinine Clearance: 98.4 mL/min (A) (by C-G formula based on SCr of 1.51  mg/dL (H)). Liver Function Tests: Recent Labs  Lab 06/16/23 1106 06/17/23 0226 06/18/23 0545  AST 27 28 28   ALT 41 39 37  ALKPHOS 60 57 53  BILITOT 1.7* 1.1 1.1  PROT 7.6 7.1 6.8  ALBUMIN 3.8 3.6 3.5   No results for input(s): "LIPASE", "AMYLASE" in the last 168 hours. No results for input(s): "AMMONIA" in the last 168 hours. Coagulation Profile: No results for input(s): "INR", "PROTIME" in the last 168 hours. Cardiac Enzymes: No results for input(s): "CKTOTAL", "CKMB", "CKMBINDEX", "TROPONINI" in the last 168 hours. BNP (last 3 results) No results for input(s): "PROBNP" in the last 8760 hours. HbA1C: No results for input(s): "HGBA1C" in the last 72 hours.  CBG: Recent Labs  Lab 06/20/23 0619 06/20/23 1041 06/20/23 1547 06/20/23 2127 06/21/23 0603  GLUCAP 118* 98 113* 151* 105*   Lipid Profile: No results for input(s): "CHOL", "HDL", "LDLCALC", "TRIG", "CHOLHDL", "LDLDIRECT" in the last 72 hours.  Thyroid Function Tests: No results for input(s): "TSH", "T4TOTAL", "FREET4", "T3FREE", "THYROIDAB" in the last 72 hours.  Anemia Panel: No results for input(s): "VITAMINB12", "FOLATE", "FERRITIN", "TIBC", "IRON", "RETICCTPCT" in the last 72 hours.  Urine analysis:    Component Value Date/Time   COLORURINE AMBER (A) 06/16/2023 0914   APPEARANCEUR HAZY (A) 06/16/2023 0914   LABSPEC 1.020 06/16/2023 0914   PHURINE 5.0 06/16/2023 0914   GLUCOSEU 150 (A) 06/16/2023 0914   HGBUR NEGATIVE 06/16/2023 0914   BILIRUBINUR NEGATIVE 06/16/2023 0914   KETONESUR NEGATIVE 06/16/2023 0914   PROTEINUR 100 (A) 06/16/2023 0914   NITRITE NEGATIVE 06/16/2023 0914   LEUKOCYTESUR NEGATIVE 06/16/2023 0914   Sepsis Labs: @LABRCNTIP (procalcitonin:4,lacticidven:4)  )No results found for this or any previous visit (from the past 240 hours).   Radiology Studies: No results found.    Scheduled Meds:  amiodarone  400 mg Oral BID   apixaban  5 mg Oral BID   Chlorhexidine Gluconate  Cloth  6 each Topical Daily   digoxin  0.125 mg Oral Daily   empagliflozin  10 mg Oral Daily   insulin aspart  0-15 Units Subcutaneous TID WC   melatonin  3 mg Oral QHS   pantoprazole  40 mg Oral Q1200   rosuvastatin  40 mg Oral Daily   sacubitril-valsartan  1 tablet Oral BID   sodium chloride flush  10-40 mL Intracatheter Q12H   spironolactone  25 mg Oral Daily   torsemide  40 mg Oral BID   Continuous Infusions:     LOS: 5 days    Time  spent:    Zannie Cove, MD [facility]   06/21/2023, 9:35 AM

## 2023-06-21 NOTE — Progress Notes (Addendum)
 Advanced Heart Failure Rounding Note  Cardiologist: Chrystie Nose, MD  Chief Complaint: Biventricular Heart Failure  Subjective:    Coox 69% this morning.   Digoxin started yesterday. SBP 110s  Diuresed with 160 BID yesterday. -4.4L UOP. Weight down 4lbs.   CVP 7. Sitting up in chair. Denies CP/SOB.   Objective:    Weight Range: (!) 167.4 kg Body mass index is 47.38 kg/m.   Vital Signs:   Temp:  [97.6 F (36.4 C)-98.1 F (36.7 C)] 98.1 F (36.7 C) (03/03 0557) Pulse Rate:  [83-88] 83 (03/02 2018) Resp:  [19-20] 20 (03/03 0557) BP: (93-116)/(69-92) 110/79 (03/03 0557) SpO2:  [94 %-95 %] 95 % (03/02 1545) Weight:  [167.4 kg] 167.4 kg (03/03 0727) Last BM Date : 06/18/23  Weight change: Filed Weights   06/19/23 0357 06/20/23 0530 06/21/23 0727  Weight: (!) 171.7 kg (!) 169.6 kg (!) 167.4 kg   Intake/Output:   Intake/Output Summary (Last 24 hours) at 06/21/2023 0808 Last data filed at 06/21/2023 0601 Gross per 24 hour  Intake 1406.43 ml  Output 4400 ml  Net -2993.57 ml    Physical Exam    CVP 7 General:  well appearing.  No respiratory difficulty HEENT: normal Neck: supple. JVD difficult to see, thick neck.  Cor: PMI nondisplaced. Regular rate & rhythm. No rubs, gallops or murmurs. Lungs: clear, diminished bases Extremities: no cyanosis, clubbing, rash, trace BLE edema  Neuro: alert & oriented x 3. Moves all 4 extremities w/o difficulty. Affect pleasant.   Telemetry   NSR 80s (Personally reviewed)    EKG    N/A  Labs    CBC Recent Labs    06/20/23 0500 06/21/23 0500  WBC 7.3 8.2  HGB 12.6* 13.4  HCT 40.7 43.2  MCV 76.2* 76.3*  PLT 238 243   Basic Metabolic Panel Recent Labs    95/28/41 0500 06/20/23 1435 06/21/23 0500  NA 134* 134* 135  K 3.5 4.1 4.1  CL 92* 97* 97*  CO2 26 27 25   GLUCOSE 175* 115* 108*  BUN 31* 31* 31*  CREATININE 1.60* 1.67* 1.51*  CALCIUM 9.4 9.1 9.2  MG 2.3  --  2.3   Liver Function Tests No results  for input(s): "AST", "ALT", "ALKPHOS", "BILITOT", "PROT", "ALBUMIN" in the last 72 hours.  No results for input(s): "LIPASE", "AMYLASE" in the last 72 hours. Cardiac Enzymes No results for input(s): "CKTOTAL", "CKMB", "CKMBINDEX", "TROPONINI" in the last 72 hours.  BNP: BNP (last 3 results) Recent Labs    06/17/23 0226 06/18/23 0545 06/19/23 0330  BNP 229.7* 97.7 146.9*    ProBNP (last 3 results) No results for input(s): "PROBNP" in the last 8760 hours.   D-Dimer No results for input(s): "DDIMER" in the last 72 hours. Hemoglobin A1C No results for input(s): "HGBA1C" in the last 72 hours.  Fasting Lipid Panel No results for input(s): "CHOL", "HDL", "LDLCALC", "TRIG", "CHOLHDL", "LDLDIRECT" in the last 72 hours.  Thyroid Function Tests No results for input(s): "TSH", "T4TOTAL", "T3FREE", "THYROIDAB" in the last 72 hours.  Invalid input(s): "FREET3"   Other results:   Medications:    Scheduled Medications:  amiodarone  400 mg Oral BID   apixaban  5 mg Oral BID   Chlorhexidine Gluconate Cloth  6 each Topical Daily   digoxin  0.125 mg Oral Daily   empagliflozin  10 mg Oral Daily   insulin aspart  0-15 Units Subcutaneous TID WC   melatonin  3 mg Oral QHS  pantoprazole  40 mg Oral Q1200   rosuvastatin  40 mg Oral Daily   sacubitril-valsartan  1 tablet Oral BID   sodium chloride flush  10-40 mL Intracatheter Q12H   spironolactone  25 mg Oral Daily   Infusions:  furosemide 160 mg (06/21/23 0733)   PRN Medications: acetaminophen **OR** acetaminophen, alum & mag hydroxide-simeth, docusate sodium, ondansetron **OR** ondansetron (ZOFRAN) IV, polyethylene glycol, sodium chloride flush, temazepam  Patient Profile   Cole Singleton is a 48 y.o. male with a hx of chronic HFrEF with presumed NICM, morbid obesity, OSA intolerant of CPAP, HTN, mild aneurysmal dilation of aorta previously seen by CT 2019, cholelithiasis, 3mm pulm nodule, severe hepatic steatosis, colonic  diverticulosis, ventral hernia by CT 2021.   Admitted with CHF exacerbation and AF RVR  Assessment/Plan   Acute on Chronic Biventricular Heart Failure - EF previously 35%, documented as NICM. Can not rule out ICM with metabolic syndrome. Never had LHC.  - Nuclear Stress in 2019 showed moderate inferior defect consistent with prior infarct, though suspect attenuation artifact - Echo: EF 20%, gHK, no LV thrombus, severely reduced RV (newly reduced) - Given IV diltiazem with severe worsening of edema and urine output - Milrinone turned off 2/28, coox borderline but improving end organ function and urine output so will continue off - Diuresed well with IV lasix. Got 160 IV this morning. CVP 7. Can start Torsemide 40 mg PO BID to start later this afternoon.  - Continue digoxin 0.125mg  daily - No bb with low output - Did not tolerate unna boots. Place TED hose - Will hold on LHC during admission given recent conversion, can consider as outpatient - Continue jardiance, entresto 24/26mg  BID, spironolactone 25mg  daily   Afib with RVR - Conversion on amiodarone. Theoretical risk of clot migration with chemical conversion, but has been adequately anticoagulated. Discussed alarm symptoms  - Transition to oral amiodarone - Continue apixaban 5mg  BID  AKI - cardiorenal - Cr 1.4 on admit - 1.5 today.   Severe OSA Obesity - severe on sleep study in 2023 - noncompliant with CPAP - Body mass index is 47.38 kg/m. - discussed need for weight loss   HTN - GDMT as above   HLD - LDL 111 - continue crestor 40 mg daily - continue asa 81 mg daily  Hypokalemia - K 4.1 this am - aggressive supp with IV and PO - goal K>4  Suspect he may be ready for discharge today vs tomorrow.   Length of Stay: 5  Alen Bleacher, NP  06/21/2023, 8:08 AM  Advanced Heart Failure Team Pager [phone] (M-F; 7a - 5p)  Please contact [facility] for night-coverage after hours (5p -7a ) and weekends on  [email or web address]

## 2023-06-22 ENCOUNTER — Other Ambulatory Visit (HOSPITAL_COMMUNITY): Payer: Self-pay

## 2023-06-22 DIAGNOSIS — I5023 Acute on chronic systolic (congestive) heart failure: Secondary | ICD-10-CM | POA: Diagnosis not present

## 2023-06-22 LAB — COOXEMETRY PANEL
Carboxyhemoglobin: 2.5 % — ABNORMAL HIGH (ref 0.5–1.5)
Methemoglobin: <0.7 % (ref 0.0–1.5)
O2 Saturation: 75.7 %
Total hemoglobin: 13.8 g/dL (ref 12.0–16.0)

## 2023-06-22 LAB — GLUCOSE, CAPILLARY
Glucose-Capillary: 109 mg/dL — ABNORMAL HIGH (ref 70–99)
Glucose-Capillary: 152 mg/dL — ABNORMAL HIGH (ref 70–99)

## 2023-06-22 LAB — BASIC METABOLIC PANEL
Anion gap: 9 (ref 5–15)
BUN: 30 mg/dL — ABNORMAL HIGH (ref 6–20)
CO2: 26 mmol/L (ref 22–32)
Calcium: 9 mg/dL (ref 8.9–10.3)
Chloride: 99 mmol/L (ref 98–111)
Creatinine, Ser: 1.5 mg/dL — ABNORMAL HIGH (ref 0.61–1.24)
GFR, Estimated: 57 mL/min — ABNORMAL LOW (ref >60)
Glucose, Bld: 101 mg/dL — ABNORMAL HIGH (ref 70–99)
Potassium: 4.1 mmol/L (ref 3.5–5.1)
Sodium: 134 mmol/L — ABNORMAL LOW (ref 135–145)

## 2023-06-22 MED ORDER — SPIRONOLACTONE 25 MG PO TABS
25.0000 mg | ORAL_TABLET | Freq: Every day | ORAL | 3 refills | Status: DC
  Filled 2023-06-22: qty 30, 30d supply, fill #0

## 2023-06-22 MED ORDER — AMIODARONE HCL 200 MG PO TABS
ORAL_TABLET | ORAL | 0 refills | Status: DC
  Filled 2023-06-22: qty 51, 30d supply, fill #0

## 2023-06-22 MED ORDER — EMPAGLIFLOZIN 10 MG PO TABS
10.0000 mg | ORAL_TABLET | Freq: Every day | ORAL | 5 refills | Status: DC
  Filled 2023-06-22: qty 30, 30d supply, fill #0

## 2023-06-22 MED ORDER — DIGOXIN 125 MCG PO TABS
0.1250 mg | ORAL_TABLET | Freq: Every day | ORAL | 3 refills | Status: DC
  Filled 2023-06-22: qty 30, 30d supply, fill #0

## 2023-06-22 MED ORDER — TORSEMIDE 20 MG PO TABS
40.0000 mg | ORAL_TABLET | Freq: Two times a day (BID) | ORAL | 3 refills | Status: DC
  Filled 2023-06-22: qty 60, 15d supply, fill #0

## 2023-06-22 MED ORDER — SACUBITRIL-VALSARTAN 24-26 MG PO TABS
1.0000 | ORAL_TABLET | Freq: Two times a day (BID) | ORAL | 3 refills | Status: DC
  Filled 2023-06-22: qty 60, 30d supply, fill #0

## 2023-06-22 MED ORDER — PANTOPRAZOLE SODIUM 40 MG PO TBEC
40.0000 mg | DELAYED_RELEASE_TABLET | Freq: Every day | ORAL | 3 refills | Status: DC
  Filled 2023-06-22: qty 30, 30d supply, fill #0

## 2023-06-22 MED ORDER — APIXABAN 5 MG PO TABS
5.0000 mg | ORAL_TABLET | Freq: Two times a day (BID) | ORAL | 11 refills | Status: DC
  Filled 2023-06-22: qty 60, 30d supply, fill #0

## 2023-06-22 MED ORDER — ROSUVASTATIN CALCIUM 40 MG PO TABS
40.0000 mg | ORAL_TABLET | Freq: Every day | ORAL | 3 refills | Status: DC
  Filled 2023-06-22: qty 30, 30d supply, fill #0

## 2023-06-22 NOTE — Discharge Summary (Signed)
 Physician Discharge Summary  Cole Singleton ZOX:[id number] DOB: [birth year] DOA: 06/15/2023  PCP: Martina Sinner, NP  Admit date: 06/15/2023 Discharge date: 06/22/2023  Time spent: 45 minutes  Recommendations for Outpatient Follow-up:  [facility] on [facility], please check BMP at follow-up Assess for left and right heart cath Needs sleep study  Discharge Diagnoses:  Principal Problem:   Acute systolic CHF, new diagnosis Left atrial thrombus   Microcytic anemia   Hypokalemia   Elevated brain natriuretic peptide (BNP) level   Paroxysmal atrial fibrillation with RVR (HCC)   Elevated troponin   AKI (acute kidney injury) (HCC)   Obesity, Class III, BMI 40-49.9 (morbid obesity) (HCC)   Cardiomyopathy (HCC)   Discharge Condition: improved  Diet recommendation: low sodium, heart healthy  Filed Weights   06/20/23 0530 06/21/23 0727 06/22/23 0500  Weight: (!) 169.6 kg (!) 167.4 kg (!) 164.6 kg    History of present illness:   48/M with morbid obesity, chronic systolic CHF, OSA, hypertension, fatty liver disease, admitted to [facility] with volume overload and AFib RVR -2D echo noted EF of 20%, severely reduced RV, transferred to [facility] -2/26 started empiric milrinone and Lasix gtt. and amiodarone gtt -2/28, TEE attempted, noted to have small thrombus in left atrium, cardioversion deferred  -3/1: Milrinone discontinued -3/3, switched to oral torsemide   Hospital Course:  Acute on chronic systolic CHF, BiV failure -Previously with chronic systolic CHF, EF 81%, presumed NICM -Echo now with EF 20%, severely reduced RV -Diuresed well on Lasix gtt and milrinone -16 L negative, volume status has improved, now off milrinone, switched to oral diuretics today, continue Aldactone, Jardiance and Entresto -Plan to consider [facility] as outpatient -Discharged home in a stable condition, meds sent to [facility], follow-up arranged   A-fib RVR -Converted to sinus rhythm 3/1,  changed to oral amiodarone and Eliquis -Cardioversion was considered initially and then deferred with finding of left atrial thrombus on TEE 2/28   Left atrial thrombus -Noted on TEE 2/28, continue Eliquis   AKI -Suspected to be cardiorenal, now improving   Severe OSA, obesity BMI is 50.4 -Was unable to tolerate CPAP in the past -Recommended repeat sleep study and compliance with CPAP -Weight loss and lifestyle modification recommended   Hyperglycemia, DM2 -A1c 6.5, consistent with new diabetes -Now Jardiance -SSI   Mild leukocytosis -Possibly reactive, improved   Severe fatty liver disease -Albumin is 3.6 now   Hypokalemia Replaced   Hypomagnesemia Replace  Discharge Exam: Vitals:   06/22/23 0811 06/22/23 0910  BP: 114/80   Pulse: 79 85  Resp: 15   Temp: 97.7 F (36.5 C)   SpO2: 97%    Gen: Awake, Alert, Oriented X 3,  HEENT: Neck obese unable to assess JVD Lungs: Good air movement bilaterally, CTAB CVS: S1S2/RRR Abd: soft, Non tender, non distended, BS present Extremities: Trace edema Skin: no new rashes on exposed skin   Discharge Instructions   Discharge Instructions     Diet - low sodium heart healthy   Complete by: As directed    Diet Carb Modified   Complete by: As directed    Increase activity slowly   Complete by: As directed       Allergies as of 06/22/2023       Reactions   Codeine Other (See Comments)   Severe headache Pt reports being able to take percocet & vicodin        Medication List     STOP taking these medications  amoxicillin-clavulanate 875-125 MG tablet Commonly known as: AUGMENTIN   carvedilol 6.25 MG tablet Commonly known as: COREG   Entresto 49-51 MG Generic drug: sacubitril-valsartan Replaced by: sacubitril-valsartan 24-26 MG   furosemide 40 MG tablet Commonly known as: LASIX   simethicone 80 MG chewable tablet Commonly known as: MYLICON       TAKE these medications    albuterol 108 (90  Base) MCG/ACT inhaler Commonly known as: VENTOLIN HFA SMARTSIG:2 Puff(s) By Mouth Every 6 Hours PRN   amiodarone 200 MG tablet Commonly known as: Pacerone Take 2 tablets (400 mg total) by mouth 2 (two) times daily for 7 days, THEN 1 tablet (200 mg total) daily for 23 days. Start taking on: June 22, 2023   apixaban 5 MG Tabs tablet Commonly known as: ELIQUIS Take 1 tablet (5 mg total) by mouth 2 (two) times daily.   benzonatate 100 MG capsule Commonly known as: TESSALON Take 100 mg by mouth 3 (three) times daily as needed.   digoxin 0.125 MG tablet Commonly known as: LANOXIN Take 1 tablet (0.125 mg total) by mouth daily. Start taking on: June 23, 2023   empagliflozin 10 MG Tabs tablet Commonly known as: Jardiance Take 1 tablet (10 mg total) by mouth daily before breakfast.   pantoprazole 40 MG tablet Commonly known as: PROTONIX Take 1 tablet (40 mg total) by mouth daily at 12 noon.   rosuvastatin 40 MG tablet Commonly known as: CRESTOR Take 1 tablet (40 mg total) by mouth daily. Start taking on: June 23, 2023 What changed:  medication strength how much to take   sacubitril-valsartan 24-26 MG Commonly known as: ENTRESTO Take 1 tablet by mouth 2 (two) times daily. Replaces: Entresto 49-51 MG   spironolactone 25 MG tablet Commonly known as: ALDACTONE Take 1 tablet (25 mg total) by mouth daily. Start taking on: June 23, 2023   Torsemide 40 MG Tabs Take 40 mg by mouth 2 (two) times daily.       Allergies  Allergen Reactions   Codeine Other (See Comments)    Severe headache Pt reports being able to take percocet & vicodin    Follow-up Information     [facility], Dois Davenport, NP. Go in 27 day(s).   Specialty: Nurse Practitioner Why: Scheduled hospital followup appointment for Monday, July 19, 2023 at 3:30 PM.  PLEASE ARRIVE 10-15 minutes early.  PLEASE bring id, medication list, and insurance cards. PLEASE call to cancel/reschedule if you CANNOT make  it. Contact information: [address] [place] [address] [phone]         [facility] Follow up on 07/01/2023.   Specialty: Cardiology Why: 3/13 @ 12 Contact information: [address] [place] [address] [phone]                 The results of significant diagnostics from this hospitalization (including imaging, microbiology, ancillary and laboratory) are listed below for reference.    Significant Diagnostic Studies: ECHO TEE Result Date: 06/18/2023    TRANSESOPHOGEAL ECHO REPORT   Patient Name:   Cole Singleton Date of Exam: 06/18/2023 Medical Rec #:  [id number]        Height:       74.0 in Accession #:    [id number]       Weight:       381.4 lb Date of Birth:  06/21/1975        BSA:  2.860 m Patient Age:    48 years         BP:           90/71 mmHg Patient Gender: M                HR:           135 bpm. Exam Location:  Inpatient Procedure: Transesophageal Echo, Cardiac Doppler and Color Doppler (Both            Spectral and Color Flow Doppler were utilized during procedure). Indications:     Atrial Fibrillation  History:         Patient has prior history of Echocardiogram examinations, most                  recent 06/16/2023. CHF and Cardiomyopathy, Arrythmias:Atrial                  Fibrillation; Risk Factors:Hypertension.  Sonographer:     Lucendia Herrlich RCS Referring Phys:  [id number] Romie Minus Diagnosing Phys: Clearnce Hasten PROCEDURE: After discussion of the risks and benefits of a TEE, an informed consent was obtained from the patient. The transesophogeal probe was passed without difficulty through the esophogus of the patient. Imaged were obtained with the patient in a left lateral decubitus position. Sedation performed by different physician. The patient was monitored while under deep sedation. The patient developed no complications during the procedure.  IMPRESSIONS  1. Septal shift  into the RV consistent with left sided volume overload. Left ventricular ejection fraction, by estimation, is <20%. The left ventricle has severely decreased function. The left ventricle demonstrates global hypokinesis. The left ventricular internal cavity size was moderately dilated.  2. Right ventricular systolic function is moderately reduced. The right ventricular size is mildly enlarged. There is moderately elevated pulmonary artery systolic pressure. The estimated right ventricular systolic pressure is 56.0 mmHg.  3. Left atrial size was moderately dilated. A left atrial/left atrial appendage thrombus was detected. The LAA emptying velocity was 28 cm/s.  4. Right atrial size was moderately dilated.  5. Moderate secondary MR. The mitral valve is normal in structure. Moderate mitral valve regurgitation. No evidence of mitral stenosis.  6. Tricuspid valve regurgitation is mild to moderate.  7. The aortic valve is tricuspid. Aortic valve regurgitation is not visualized.  8. The left upper pulmonary vein is abnormal. FINDINGS  Left Ventricle: Septal shift into the RV consistent with left sided volume overload. Left ventricular ejection fraction, by estimation, is <20%. The left ventricle has severely decreased function. The left ventricle demonstrates global hypokinesis. The left ventricular internal cavity size was moderately dilated. Right Ventricle: The right ventricular size is mildly enlarged. No increase in right ventricular wall thickness. Right ventricular systolic function is moderately reduced. There is moderately elevated pulmonary artery systolic pressure. The tricuspid regurgitant velocity is 3.20 m/s, and with an assumed right atrial pressure of 15 mmHg, the estimated right ventricular systolic pressure is 56.0 mmHg. Left Atrium: Left atrial size was moderately dilated. Spontaneous echo contrast was present in the left atrium. A left atrial/left atrial appendage thrombus was detected. The LAA emptying  velocity was 28 cm/s. Right Atrium: Right atrial size was moderately dilated. Pericardium: There is no evidence of pericardial effusion. Mitral Valve: Moderate secondary MR. The mitral valve is normal in structure. Moderate mitral valve regurgitation. No evidence of mitral valve stenosis. Tricuspid Valve: The tricuspid valve is normal in structure. Tricuspid valve regurgitation is mild to moderate. Aortic Valve:  The aortic valve is tricuspid. Aortic valve regurgitation is not visualized. Pulmonic Valve: The pulmonic valve was grossly normal. Pulmonic valve regurgitation is not visualized. Aorta: The aortic root, ascending aorta and aortic arch are all structurally normal, with no evidence of dilitation or obstruction. Venous: The left upper pulmonary vein is abnormal. IAS/Shunts: No atrial level shunt detected by color flow Doppler. Additional Comments: Spectral Doppler performed. TRICUSPID VALVE TR Peak grad:   41.0 mmHg TR Vmax:        320.00 cm/s Clearnce Hasten Electronically signed by Clearnce Hasten Signature Date/Time: 06/18/2023/4:50:53 PM    Final    EP STUDY Result Date: 06/18/2023 See surgical note for result.  DG Chest Port 1 View Result Date: 06/17/2023 CLINICAL DATA:  [id number] S/P PICC central line placement [id number] EXAM: PORTABLE CHEST 1 VIEW COMPARISON:  06/15/2023 FINDINGS: Interval placement of left-sided PICC line with distal tip terminating near the superior cavoatrial junction. Multiple overlying cardiac leads. Stable cardiomegaly. Slight improvement in the degree of edema bilaterally. No pneumothorax. IMPRESSION: 1. Interval placement of left-sided PICC line with distal tip terminating near the superior cavoatrial junction. 2. Slight improvement in the degree of edema. Electronically Signed   By: Duanne Guess D.O.   On: 06/17/2023 11:17   [place] EKG SITE RITE Result Date: 06/16/2023 If [facility] image not attached, placement could not be confirmed due to current cardiac  rhythm.  ECHOCARDIOGRAM COMPLETE Result Date: 06/16/2023    ECHOCARDIOGRAM REPORT   Patient Name:   PRADYUN ISHMAN Date of Exam: 06/16/2023 Medical Rec #:  [id number]        Height:       74.0 in Accession #:    [id number]       Weight:       375.0 lb Date of Birth:  [birth year]        BSA:          2.839 m Patient Age:    48 years         BP:           98/83 mmHg Patient Gender: M                HR:           84 bpm. [facility] Procedure: 2D Echo, Cardiac Doppler, Color Doppler and Intracardiac            Opacification Agent (Both Spectral and Color Flow Doppler were            utilized during procedure). Indications:    CHF-acute systolic  History:        Patient has prior history of Echocardiogram examinations, most                 recent 10/20/2021. CHF and Cardiomyopathy, Arrythmias:Atrial                 Fibrillation; Risk Factors:Hypertension and Former Smoker. NICM.  Sonographer:    Vern Claude Referring Phys: [id number] OLADAPO ADEFESO  Sonographer Comments: Image acquisition challenging due to patient body habitus. IMPRESSIONS  1. Images are limited.  2. Left ventricular ejection fraction, by estimation, is approximately 20%. The left ventricle has severely decreased function. Left ventricular endocardial border not optimally defined to evaluate regional wall motion. The left ventricular internal cavity size was moderately to severely dilated. There is mild concentric left ventricular hypertrophy. Left ventricular diastolic parameters are indeterminate.  3. Definity contrast shows no obvious formed LV mural thrombus.  4.  Right ventricular systolic function is severely reduced. The right ventricular size is normal. Tricuspid regurgitation signal is inadequate for assessing PA pressure.  5. The mitral valve is grossly normal. Trivial mitral valve regurgitation.  6. The aortic valve was not well visualized. Aortic valve regurgitation is not visualized. Aortic valve mean gradient measures 1.0  mmHg.  7. The inferior vena cava is dilated in size with <50% respiratory variability, suggesting right atrial pressure of 15 mmHg. Comparison(s): Prior images reviewed side by side. LVEF severely reduced at approximately 20%, RV dysfunction is new. FINDINGS  Left Ventricle: Left ventricular ejection fraction, by estimation, is 20%. The left ventricle has severely decreased function. Left ventricular endocardial border not optimally defined to evaluate regional wall motion. Strain imaging was not performed. The left ventricular internal cavity size was moderately to severely dilated. There is mild concentric left ventricular hypertrophy. Left ventricular diastolic function could not be evaluated due to atrial fibrillation. Left ventricular diastolic parameters are indeterminate. Right Ventricle: The right ventricular size is normal. Right vetricular wall thickness was not well visualized. Right ventricular systolic function is severely reduced. Tricuspid regurgitation signal is inadequate for assessing PA pressure. Left Atrium: Left atrial size was normal in size. Right Atrium: Right atrial size was normal in size. Pericardium: Trivial pericardial effusion is present. The pericardial effusion is posterior to the left ventricle and lateral to the left ventricle. Mitral Valve: The mitral valve is grossly normal. Trivial mitral valve regurgitation. MV peak gradient, 4.8 mmHg. The mean mitral valve gradient is 2.0 mmHg. Tricuspid Valve: The tricuspid valve is not well visualized. Tricuspid valve regurgitation is trivial. Aortic Valve: The aortic valve was not well visualized. There is mild aortic valve annular calcification. Aortic valve regurgitation is not visualized. Aortic valve mean gradient measures 1.0 mmHg. Aortic valve peak gradient measures 2.1 mmHg. Aortic valve area, by VTI measures 2.21 cm. Pulmonic Valve: The pulmonic valve was not well visualized. Pulmonic valve regurgitation is trivial. Aorta: The  aortic root and ascending aorta are structurally normal, with no evidence of dilitation. Venous: The inferior vena cava is dilated in size with less than 50% respiratory variability, suggesting right atrial pressure of 15 mmHg. IAS/Shunts: The interatrial septum was not well visualized. Additional Comments: 3D imaging was not performed.  LEFT VENTRICLE PLAX 2D LVIDd:         7.20 cm      Diastology LVIDs:         6.20 cm      LV e' lateral:   8.05 cm/s LV PW:         1.10 cm      LV E/e' lateral: 12.0 LV IVS:        0.90 cm LVOT diam:     2.00 cm LV SV:         20 LV SV Index:   7 LVOT Area:     3.14 cm  LV Volumes (MOD) LV vol d, MOD A2C: 328.0 ml LV vol d, MOD A4C: 453.0 ml LV vol s, MOD A2C: 207.0 ml LV vol s, MOD A4C: 346.0 ml LV SV MOD A2C:     121.0 ml LV SV MOD A4C:     453.0 ml LV SV MOD BP:      98.6 ml RIGHT VENTRICLE            IVC RV S prime:     6.96 cm/s  IVC diam: 2.50 cm LEFT ATRIUM  Index        RIGHT ATRIUM           Index LA diam:        3.20 cm 1.13 cm/m   RA Area:     12.00 cm LA Vol (A2C):   56.9 ml 20.04 ml/m  RA Volume:   20.20 ml  7.11 ml/m LA Vol (A4C):   77.0 ml 27.12 ml/m LA Biplane Vol: 72.7 ml 25.61 ml/m  AORTIC VALVE                    PULMONIC VALVE AV Area (Vmax):    2.60 cm     PV Vmax:       0.56 m/s AV Area (Vmean):   2.23 cm     PV Peak grad:  1.2 mmHg AV Area (VTI):     2.21 cm AV Vmax:           73.30 cm/s AV Vmean:          44.900 cm/s AV VTI:            0.089 m AV Peak Grad:      2.1 mmHg AV Mean Grad:      1.0 mmHg LVOT Vmax:         60.60 cm/s LVOT Vmean:        31.900 cm/s LVOT VTI:          0.063 m LVOT/AV VTI ratio: 0.70  AORTA Ao Root diam: 3.20 cm Ao Asc diam:  2.80 cm MITRAL VALVE MV Area (PHT): 6.60 cm    SHUNTS MV Area VTI:   1.30 cm    Systemic VTI:  0.06 m MV Peak grad:  4.8 mmHg    Systemic Diam: 2.00 cm MV Mean grad:  2.0 mmHg MV Vmax:       1.10 m/s MV Vmean:      66.4 cm/s MV Decel Time: 115 msec MV E velocity: 96.90 cm/s Nona Dell  MD Electronically signed by Nona Dell MD Signature Date/Time: 06/16/2023/3:38:25 PM    Final    DG Chest 2 View Result Date: 06/15/2023 CLINICAL DATA:  Shortness of breath. Congestion. History of congestive heart failure. Bloated feeling and short of breath. EXAM: CHEST - 2 VIEW COMPARISON:  04/28/2017 FINDINGS: Cardiac enlargement with central pulmonary vascular congestion and bilateral perihilar infiltrates consistent with edema. No pleural effusion or pneumothorax. Mediastinal contours appear intact. Degenerative changes in the spine. IMPRESSION: Cardiac enlargement with pulmonary vascular congestion and perihilar infiltrates, likely edema. Electronically Signed   By: Burman Nieves M.D.   On: 06/15/2023 19:13    Microbiology: No results found for this or any previous visit (from the past 240 hours).   Labs: Basic Metabolic Panel: Recent Labs  Lab 06/17/23 0226 06/17/23 1400 06/18/23 0545 06/18/23 1444 06/19/23 0613 06/19/23 1603 06/20/23 0500 06/20/23 1435 06/21/23 0500 06/22/23 0619  NA 134*   < > 129*   < > 132* 137 134* 134* 135 134*  K 3.6   < > 2.7*   < > 3.2* 3.8 3.5 4.1 4.1 4.1  CL 99   < > 88*   < > 92* 94* 92* 97* 97* 99  CO2 19*   < > 25   < > 27 28 26 27 25 26   GLUCOSE 166*   < > 382*   < > 220* 113* 175* 115* 108* 101*  BUN 31*   < > 29*   < > 26*  27* 31* 31* 31* 30*  CREATININE 1.87*   < > 1.68*   < > 1.45* 1.66* 1.60* 1.67* 1.51* 1.50*  CALCIUM 9.0   < > 8.1*   < > 8.7* 9.4 9.4 9.1 9.2 9.0  MG 1.9  --  1.8  --  2.1  --  2.3  --  2.3  --   PHOS 3.7  --   --   --   --   --   --   --   --   --    < > = values in this interval not displayed.   Liver Function Tests: Recent Labs  Lab 06/16/23 1106 06/17/23 0226 06/18/23 0545  AST 27 28 28   ALT 41 39 37  ALKPHOS 60 57 53  BILITOT 1.7* 1.1 1.1  PROT 7.6 7.1 6.8  ALBUMIN 3.8 3.6 3.5   No results for input(s): "LIPASE", "AMYLASE" in the last 168 hours. No results for input(s): "AMMONIA" in the last 168  hours. CBC: Recent Labs  Lab 06/17/23 0226 06/18/23 0545 06/19/23 0424 06/20/23 0500 06/21/23 0500  WBC 13.6* 11.2* 7.9 7.3 8.2  HGB 12.1* 12.0* 12.2* 12.6* 13.4  HCT 38.3* 38.5* 39.4 40.7 43.2  MCV 75.5* 75.0* 75.2* 76.2* 76.3*  PLT 283 264 262 238 243   Cardiac Enzymes: No results for input(s): "CKTOTAL", "CKMB", "CKMBINDEX", "TROPONINI" in the last 168 hours. BNP: BNP (last 3 results) Recent Labs    06/17/23 0226 06/18/23 0545 06/19/23 0330  BNP 229.7* 97.7 146.9*    ProBNP (last 3 results) No results for input(s): "PROBNP" in the last 8760 hours.  CBG: Recent Labs  Lab 06/21/23 0603 06/21/23 1154 06/21/23 1645 06/21/23 2043 06/22/23 0634  GLUCAP 105* 124* 110* 133* 109*       Signed:  Zannie Cove MD.  [facility] 06/22/2023, 11:34 AM

## 2023-06-22 NOTE — Plan of Care (Signed)

## 2023-06-22 NOTE — Progress Notes (Signed)
 Discharge instructions (including medications) discussed with and copy provided to patient/caregiver

## 2023-06-22 NOTE — TOC Transition Note (Signed)
 Transition of Care [facility]) - Discharge Note   Patient Details  Name: Cole Singleton MRN: [id number] Date of Birth: 03-03-1976  [facility] CM/SW Contact:  Elliot Cousin, RN Phone Number: [phone] 06/22/2023, 12:04 PM   Clinical Narrative:    TOC CM spoke to pt at bedside and states his parents will provide transportation home. No pillbox was available. Pt states he can pick up pillbox. He has scale at home for daily weights. Has Living Better with HF booklet. Reviewed with pt heart healthy, low sodium diet.    Final next level of care: Home/Self Care Barriers to Discharge: No Barriers Identified   Patient Goals and CMS Choice Patient states their goals for this hospitalization and ongoing recovery are:: wants to remain independent          Discharge Placement                       Discharge Plan and Services Additional resources added to the After Visit Summary for     Discharge Planning Services: CM Consult    Social Drivers of Health (SDOH) Interventions SDOH Screenings   Food Insecurity: No Food Insecurity (06/16/2023)  Housing: Low Risk  (06/16/2023)  Transportation Needs: No Transportation Needs (06/16/2023)  Utilities: Not At Risk (06/16/2023)  Tobacco Use: High Risk (06/15/2023)     Readmission Risk Interventions     No data to display

## 2023-06-22 NOTE — Progress Notes (Signed)
 Advanced Heart Failure Rounding Note  Cardiologist: Chrystie Nose, MD  Chief Complaint: Biventricular Heart Failure  Subjective:    Coox 69% this morning.   Digoxin started yesterday. SBP 110s  Diuresed with 160 BID yesterday. -4.4L UOP. Weight down 4lbs.   CVP 7. Sitting up in chair. Denies CP/SOB.   Objective:    Weight Range: (!) 164.6 kg Body mass index is 46.59 kg/m.   Vital Signs:   Temp:  [96.7 F (35.9 C)-98.2 F (36.8 C)] 97.7 F (36.5 C) (03/04 0811) Pulse Rate:  [79-85] 79 (03/04 0811) Resp:  [15-23] 15 (03/04 0811) BP: (101-128)/(71-94) 114/80 (03/04 0811) SpO2:  [97 %] 97 % (03/04 0811) Weight:  [164.6 kg] 164.6 kg (03/04 0500) Last BM Date : 06/18/23  Weight change: Filed Weights   06/20/23 0530 06/21/23 0727 06/22/23 0500  Weight: (!) 169.6 kg (!) 167.4 kg (!) 164.6 kg   Intake/Output:   Intake/Output Summary (Last 24 hours) at 06/22/2023 0824 Last data filed at 06/22/2023 0817 Gross per 24 hour  Intake 480 ml  Output 3350 ml  Net -2870 ml    Physical Exam    CVP 7 General:  well appearing.  No respiratory difficulty HEENT: normal Neck: supple. JVD difficult to see, thick neck.  Cor: PMI nondisplaced. Regular rate & rhythm. No rubs, gallops or murmurs. Lungs: clear, diminished bases Extremities: no cyanosis, clubbing, rash, trace BLE edema  Neuro: alert & oriented x 3. Moves all 4 extremities w/o difficulty. Affect pleasant.   Telemetry   NSR 80s (Personally reviewed)    EKG    N/A  Labs    CBC Recent Labs    06/20/23 0500 06/21/23 0500  WBC 7.3 8.2  HGB 12.6* 13.4  HCT 40.7 43.2  MCV 76.2* 76.3*  PLT 238 243   Basic Metabolic Panel Recent Labs    91/47/82 0500 06/20/23 1435 06/21/23 0500 06/22/23 0619  NA 134*   < > 135 134*  K 3.5   < > 4.1 4.1  CL 92*   < > 97* 99  CO2 26   < > 25 26  GLUCOSE 175*   < > 108* 101*  BUN 31*   < > 31* 30*  CREATININE 1.60*   < > 1.51* 1.50*  CALCIUM 9.4   < > 9.2 9.0  MG  2.3  --  2.3  --    < > = values in this interval not displayed.   Liver Function Tests No results for input(s): "AST", "ALT", "ALKPHOS", "BILITOT", "PROT", "ALBUMIN" in the last 72 hours.  No results for input(s): "LIPASE", "AMYLASE" in the last 72 hours. Cardiac Enzymes No results for input(s): "CKTOTAL", "CKMB", "CKMBINDEX", "TROPONINI" in the last 72 hours.  BNP: BNP (last 3 results) Recent Labs    06/17/23 0226 06/18/23 0545 06/19/23 0330  BNP 229.7* 97.7 146.9*    ProBNP (last 3 results) No results for input(s): "PROBNP" in the last 8760 hours.   D-Dimer No results for input(s): "DDIMER" in the last 72 hours. Hemoglobin A1C No results for input(s): "HGBA1C" in the last 72 hours.  Fasting Lipid Panel No results for input(s): "CHOL", "HDL", "LDLCALC", "TRIG", "CHOLHDL", "LDLDIRECT" in the last 72 hours.  Thyroid Function Tests No results for input(s): "TSH", "T4TOTAL", "T3FREE", "THYROIDAB" in the last 72 hours.  Invalid input(s): "FREET3"   Other results:   Medications:    Scheduled Medications:  amiodarone  400 mg Oral BID   apixaban  5 mg  Oral BID   Chlorhexidine Gluconate Cloth  6 each Topical Daily   digoxin  0.125 mg Oral Daily   empagliflozin  10 mg Oral Daily   insulin aspart  0-15 Units Subcutaneous TID WC   melatonin  3 mg Oral QHS   pantoprazole  40 mg Oral Q1200   rosuvastatin  40 mg Oral Daily   sacubitril-valsartan  1 tablet Oral BID   sodium chloride flush  10-40 mL Intracatheter Q12H   spironolactone  25 mg Oral Daily   torsemide  40 mg Oral BID   Infusions:   PRN Medications: acetaminophen **OR** acetaminophen, alum & mag hydroxide-simeth, docusate sodium, ondansetron **OR** ondansetron (ZOFRAN) IV, polyethylene glycol, sodium chloride flush, temazepam  Patient Profile   Cole Singleton is a 48 y.o. male with a hx of chronic HFrEF with presumed NICM, morbid obesity, OSA intolerant of CPAP, HTN, mild aneurysmal dilation of aorta  previously seen by CT 2019, cholelithiasis, 3mm pulm nodule, severe hepatic steatosis, colonic diverticulosis, ventral hernia by CT 2021.   Admitted with CHF exacerbation and AF RVR  Assessment/Plan   Acute on Chronic Biventricular Heart Failure - EF previously 35%, documented as NICM. Can not rule out ICM with metabolic syndrome. Never had LHC.  - Nuclear Stress in 2019 showed moderate inferior defect consistent with prior infarct, though suspect attenuation artifact - Echo: EF 20%, gHK, no LV thrombus, severely reduced RV (newly reduced) - Given IV diltiazem with severe worsening of edema and urine output - Milrinone turned off 2/28, coox borderline but improving end organ function and urine output so will continue off - Diuresed well with IV lasix. Got 160 IV this morning. CVP 7. Can start Torsemide 40 mg PO BID to start later this afternoon.  - Continue digoxin 0.125mg  daily - No bb with low output - Did not tolerate unna boots. Place TED hose - Will hold on LHC during admission given recent conversion, can consider as outpatient - Continue jardiance, entresto 24/26mg  BID, spironolactone 25mg  daily   Afib with RVR - Conversion on amiodarone. Theoretical risk of clot migration with chemical conversion, but has been adequately anticoagulated. Discussed alarm symptoms  - Transition to oral amiodarone - Continue apixaban 5mg  BID  AKI - cardiorenal - Cr 1.4 on admit - 1.5 today.   Severe OSA Obesity - severe on sleep study in 2023 - noncompliant with CPAP - Body mass index is 46.59 kg/m. - discussed need for weight loss   HTN - GDMT as above   HLD - LDL 111 - continue crestor 40 mg daily - continue asa 81 mg daily  Hypokalemia - K 4.1 this am - aggressive supp with IV and PO - goal K>4  Stable for discharge today. Has f/u scheduled in AHF clinic. Please send new meds to [facility] pharmacy.   AHF meds at discharge:  Amiodarone 400mg  BID for 7 days. Then 200 mg daily.   Eliquis 5 mg BID Digoxin 0.125 mg daily Jardiance 10 mg daily Rosuvastatin 40 mg daily Entresto 24-26 mg BID Spironolactone 25 mg daily Torsemide 40 mg daily  Length of Stay: 6  Alen Bleacher, NP  06/22/2023, 8:24 AM  Advanced Heart Failure Team Pager [phone] (M-F; 7a - 5p)  Please contact [facility] for night-coverage after hours (5p -7a ) and weekends on [email or web address]

## 2023-06-30 NOTE — Progress Notes (Signed)
 ADVANCED HF CLINIC CONSULT NOTE  Referring Physician: Martina Sinner, NP Primary Care: Evern Bio, Dois Davenport, NP Primary Cardiologist: Chrystie Nose, MD  Chief Complaint: Heart failure HPI: Cole Singleton is a 48 y.o. male with a hx of chronic HFrEF with presumed NICM, morbid obesity, OSA intolerant of CPAP, HTN, mild aneurysmal dilation of aorta previously seen by CT 2019, cholelithiasis, 3mm pulm nodule, severe hepatic steatosis, colonic diverticulosis, and ventral hernia.   Established care with [facility] in 2016. EF 20-25%. Lost to follow up until 2019. Nuclear stress in 2019 showed mod inferior defect consistent with prior infact. Has continued to follow with intermittent lapses in medication adherence.    Echo 7/23 showed EF 20-25% and gHK.    2/25 he developed viral URI and was treated with Augmentin, however symptoms worsened and he developed palpitations and abdominal bloating. He then took PRN Lasix without increase in UOP and developed progressive shortness of breath.    Presented to [facility] 06/15/23 where he was noted to be in atrial fibrillation with RVR. He was given Lovenox and started on IV diltiazem gtt, and given Lopressor 5 mg IV, Lasix IV 40 mg, solumedrol 40 mg. CXR with cardiomegaly with with pulm edema. Cardiology consulted. Dilt was stopped and he was started on amio gtt. Echo 2/25 showed EF 20%, gHK, no LV thrombus, severely reduced RV (newly reduced). AHF team consulted and found patient with conversational dyspnea, a poor response to IV lasix, cool extremities and narrow pulse pressure. He was still in AF with rates 120s-130s. PICC line was placed. He was started on milrinone and lasix gtt. Diuresed very well with milrinone and lasix gtt. Milrinone was able to be weaned off and he was started on GDMT.   Today he returns for post hospital follow up. Overall feeling pretty good. Denies palpitations, CP, dizziness, or PND/Orthopnea. Has BLE edema,  much improved though. Has not really been SOB. Appetite ok, tried to watch what he eats and not reach for the salt shaker. Drinks 5-6 bottles of water plus juice, tea and mountain dews. No fever or chills. Weight at home 372 pounds. Taking all medications. Denies ETOH, tobacco or drug use. Just does dip.   Past Medical History:  Diagnosis Date   Cardiomyopathy (HCC)    Cholelithiasis    Chronic HFrEF (heart failure with reduced ejection fraction) (HCC)    Diverticulosis    Hepatic steatosis    Hypertension    Morbid obesity (HCC)    OSA (obstructive sleep apnea)    Pneumonia    Pulmonary nodule    Current Outpatient Medications  Medication Sig Dispense Refill   amiodarone (PACERONE) 200 MG tablet Take 2 tablets (400 mg total) by mouth 2 (two) times daily for 7 days, THEN 1 tablet (200 mg total) daily for 23 days. 51 tablet 0   apixaban (ELIQUIS) 5 MG TABS tablet Take 1 tablet (5 mg total) by mouth 2 (two) times daily. 60 tablet 11   benzonatate (TESSALON) 100 MG capsule Take 100 mg by mouth 3 (three) times daily as needed.     digoxin (LANOXIN) 0.125 MG tablet Take 1 tablet (0.125 mg total) by mouth daily. 30 tablet 3   empagliflozin (JARDIANCE) 10 MG TABS tablet Take 1 tablet (10 mg total) by mouth daily before breakfast. 30 tablet 5   pantoprazole (PROTONIX) 40 MG tablet Take 1 tablet (40 mg total) by mouth daily at 12 noon. 30 tablet 3   rosuvastatin (CRESTOR) 40  MG tablet Take 1 tablet (40 mg total) by mouth daily. 30 tablet 3   sacubitril-valsartan (ENTRESTO) 24-26 MG Take 1 tablet by mouth 2 (two) times daily. 60 tablet 3   spironolactone (ALDACTONE) 25 MG tablet Take 1 tablet (25 mg total) by mouth daily. 30 tablet 3   torsemide (DEMADEX) 20 MG tablet Take 2 tablets (40 mg total) by mouth 2 (two) times daily. 60 tablet 3   No current facility-administered medications for this encounter.   Allergies  Allergen Reactions   Codeine Other (See Comments)    Severe headache Pt  reports being able to take percocet & vicodin   Social History   Socioeconomic History   Marital status: Married    Spouse name: Not on file   Number of children: Not on file   Years of education: Not on file   Highest education level: Not on file  Occupational History   Not on file  Tobacco Use   Smoking status: Never   Smokeless tobacco: Current    Types: Snuff  Vaping Use   Vaping status: Never Used  Substance and Sexual Activity   Alcohol use: Yes    Comment: occas - 12 beers/yr   Drug use: No   Sexual activity: Not on file  Other Topics Concern   Not on file  Social History Narrative   Not on file   Social Drivers of Health   Financial Resource Strain: Not on file  Food Insecurity: No Food Insecurity (06/16/2023)   Hunger Vital Sign    Worried About Running Out of Food in the Last Year: Never true    Ran Out of Food in the Last Year: Never true  Transportation Needs: No Transportation Needs (06/16/2023)   PRAPARE - Administrator, Civil Service (Medical): No    Lack of Transportation (Non-Medical): No  Physical Activity: Not on file  Stress: Not on file  Social Connections: Not on file  Intimate Partner Violence: Not At Risk (06/16/2023)   Humiliation, Afraid, Rape, and Kick questionnaire    Fear of Current or Ex-Partner: No    Emotionally Abused: No    Physically Abused: No    Sexually Abused: No    Family History  Problem Relation Age of Onset   Hypertension Mother    Hypertension Father    Vitals:   07/01/23 1211  BP: 122/84  Pulse: 94  SpO2: 95%  Weight: (!) 172.3 kg (379 lb 14.4 oz)   PHYSICAL EXAM: General:  well appearing.  No respiratory difficulty. Walked into clinic HEENT: normal Neck: supple. JVD difficult to see.  Cor: PMI nondisplaced. Regular rate & rhythm. No rubs, gallops or murmurs. Lungs: clear Extremities: no cyanosis, clubbing, rash, trace BLE edema  Neuro: alert & oriented x 3. Moves all 4 extremities w/o  difficulty. Affect pleasant.   Wt Readings from Last 3 Encounters:  07/01/23 (!) 172.3 kg (379 lb 14.4 oz)  06/22/23 (!) 164.6 kg (362 lb 14 oz)  03/17/23 (!) 168.7 kg (372 lb)    ECG: NSR 89 bpm  (Personally reviewed)    Unable to get ReDs clip x2.    ASSESSMENT & PLAN: Chronic biventricular heart failure - EF previously 35%, documented as NICM. Can not rule out ICM with metabolic syndrome. Never had LHC.  - Nuclear Stress in 2019 showed moderate inferior defect consistent with prior infarct, though suspect attenuation artifact - Echo: EF 20%, gHK, no LV thrombus, severely reduced RV (newly reduced) - NYHA  II-IIIa. Volume mildly overloaded. Weight up 17 lbs since discharged but completely asymptomatic.  - Increase torsemide to 60 mg BID - Continue digoxin 0.125 mg daily. Instructed to not take morning of f/u so that we can check level - Continue Jardiance 10 mg daily. Denies GU symptoms  - Continue Entresto 24-26 mg BID - Continue spiro 25 mg daily - Add BB at next visit.  - BMET/BNP today. Repeat BMET in 7-10 days - Discussed cutting back fluid intake and wearing compression socks which he has at home.   Atrial fibrillation with hx of RVR - During recent admission, chemically converted with amiodarone - Today in NSR - Continue amiodarone. Sent home with taper. Ok to stop when he runs out. EKG at next visit.  - Continue Eliquis 5 mg BID. Denies abnormal bleeding. CBC today   Elevated renal function - SCr baseline this last admission ~1.4 - BMET today  HTN - GDMT as above - BP stable today   Severe OSA - severe on 2023 sleep study - Does not have CPAP at home.  - Refer to Dr. Mayford Knife for CPAP  Obesity - Body mass index is 48.78 kg/m.  - discussed need for weight loss - Refer to PharmD for GLP-1   HLD - LDL 111 - Continue crestor 40 mg daily  HFSW to call about disability.   Follow up in 1 month to reassess volume.   Alen Bleacher AGACNP-BC  07/01/23

## 2023-07-01 ENCOUNTER — Encounter (HOSPITAL_COMMUNITY): Payer: Self-pay

## 2023-07-01 ENCOUNTER — Telehealth (HOSPITAL_COMMUNITY): Payer: Self-pay | Admitting: Licensed Clinical Social Worker

## 2023-07-01 ENCOUNTER — Ambulatory Visit (HOSPITAL_COMMUNITY)
Admit: 2023-07-01 | Discharge: 2023-07-01 | Disposition: A | Discharge status: Home / Self Care | Attending: Internal Medicine | Admitting: Internal Medicine

## 2023-07-01 ENCOUNTER — Other Ambulatory Visit (HOSPITAL_COMMUNITY): Payer: Self-pay | Admitting: Cardiology

## 2023-07-01 VITALS — BP 122/84 | HR 94 | Wt 379.9 lb

## 2023-07-01 DIAGNOSIS — G4733 Obstructive sleep apnea (adult) (pediatric): Secondary | ICD-10-CM | POA: Insufficient documentation

## 2023-07-01 DIAGNOSIS — I719 Aortic aneurysm of unspecified site, without rupture: Secondary | ICD-10-CM | POA: Diagnosis not present

## 2023-07-01 DIAGNOSIS — I5082 Biventricular heart failure: Secondary | ICD-10-CM | POA: Diagnosis not present

## 2023-07-01 DIAGNOSIS — I110 Hypertensive heart disease with heart failure: Secondary | ICD-10-CM | POA: Insufficient documentation

## 2023-07-01 DIAGNOSIS — I5022 Chronic systolic (congestive) heart failure: Secondary | ICD-10-CM | POA: Diagnosis present

## 2023-07-01 DIAGNOSIS — Z7984 Long term (current) use of oral hypoglycemic drugs: Secondary | ICD-10-CM | POA: Diagnosis not present

## 2023-07-01 DIAGNOSIS — K760 Fatty (change of) liver, not elsewhere classified: Secondary | ICD-10-CM | POA: Diagnosis not present

## 2023-07-01 DIAGNOSIS — E785 Hyperlipidemia, unspecified: Secondary | ICD-10-CM | POA: Insufficient documentation

## 2023-07-01 DIAGNOSIS — R911 Solitary pulmonary nodule: Secondary | ICD-10-CM | POA: Insufficient documentation

## 2023-07-01 DIAGNOSIS — I10 Essential (primary) hypertension: Secondary | ICD-10-CM

## 2023-07-01 DIAGNOSIS — N289 Disorder of kidney and ureter, unspecified: Secondary | ICD-10-CM | POA: Diagnosis not present

## 2023-07-01 DIAGNOSIS — I4891 Unspecified atrial fibrillation: Secondary | ICD-10-CM | POA: Diagnosis not present

## 2023-07-01 DIAGNOSIS — Z6841 Body Mass Index (BMI) 40.0 and over, adult: Secondary | ICD-10-CM | POA: Diagnosis not present

## 2023-07-01 DIAGNOSIS — I428 Other cardiomyopathies: Secondary | ICD-10-CM | POA: Diagnosis not present

## 2023-07-01 DIAGNOSIS — Z7901 Long term (current) use of anticoagulants: Secondary | ICD-10-CM | POA: Insufficient documentation

## 2023-07-01 DIAGNOSIS — Z79899 Other long term (current) drug therapy: Secondary | ICD-10-CM | POA: Diagnosis not present

## 2023-07-01 LAB — BRAIN NATRIURETIC PEPTIDE: B Natriuretic Peptide: 176.8 pg/mL — ABNORMAL HIGH (ref 0.0–100.0)

## 2023-07-01 LAB — CBC
HCT: 48.9 % (ref 39.0–52.0)
Hemoglobin: 15.2 g/dL (ref 13.0–17.0)
MCH: 23.7 pg — ABNORMAL LOW (ref 26.0–34.0)
MCHC: 31.1 g/dL (ref 30.0–36.0)
MCV: 76.2 fL — ABNORMAL LOW (ref 80.0–100.0)
Platelets: 235 10*3/uL (ref 150–400)
RBC: 6.42 MIL/uL — ABNORMAL HIGH (ref 4.22–5.81)
RDW: 19.1 % — ABNORMAL HIGH (ref 11.5–15.5)
WBC: 7.7 10*3/uL (ref 4.0–10.5)
nRBC: 0 % (ref 0.0–0.2)

## 2023-07-01 LAB — BASIC METABOLIC PANEL
Anion gap: 10 (ref 5–15)
BUN: 20 mg/dL (ref 6–20)
CO2: 24 mmol/L (ref 22–32)
Calcium: 8.8 mg/dL — ABNORMAL LOW (ref 8.9–10.3)
Chloride: 101 mmol/L (ref 98–111)
Creatinine, Ser: 1.5 mg/dL — ABNORMAL HIGH (ref 0.61–1.24)
GFR, Estimated: 57 mL/min — ABNORMAL LOW (ref >60)
Glucose, Bld: 146 mg/dL — ABNORMAL HIGH (ref 70–99)
Potassium: 4.2 mmol/L (ref 3.5–5.1)
Sodium: 135 mmol/L (ref 135–145)

## 2023-07-01 MED ORDER — TORSEMIDE 60 MG PO TABS: 60.0000 mg | ORAL_TABLET | Freq: Two times a day (BID) | ORAL | 3 refills | Status: DC

## 2023-07-01 MED ORDER — TORSEMIDE 20 MG PO TABS: 60.0000 mg | ORAL_TABLET | Freq: Two times a day (BID) | ORAL | 3 refills | Status: DC

## 2023-07-01 MED ORDER — TORSEMIDE 20 MG PO TABS: 40.0000 mg | ORAL_TABLET | Freq: Two times a day (BID) | ORAL | 3 refills | Status: DC

## 2023-07-01 NOTE — Patient Instructions (Addendum)
 Increase torsemide to 60 mg twice daily - Rx sent. Labs today - will call you if abnormal. Return in 7 - 10 days for repeat labs. DO NOT TAKE YOUR DIGOXIN BEFORE LABS ARE DRAWN.  Referral sent to Dr. Mayford Knife for CPAP.  Her office will call you to make first appointment. Contact info below. Referral sent to Pharmacy for medication for weight loss. They will call you to make first appointment. Contact info below. Please call us at [phone] if any questions or concerns prior to next visit.

## 2023-07-01 NOTE — Telephone Encounter (Signed)
 CSW consulted to speak with pt regarding questions with disability- unable to reach- left VM requesting return call  Burna Sis, LCSW Clinical Social Worker Advanced Heart Failure Clinic Desk#: [phone] Cell#: [phone]

## 2023-07-08 ENCOUNTER — Ambulatory Visit (HOSPITAL_COMMUNITY)
Admission: RE | Admit: 2023-07-08 | Discharge: 2023-07-08 | Disposition: A | Discharge status: Home / Self Care | Source: Ambulatory Visit | Attending: Cardiology | Admitting: Cardiology

## 2023-07-08 DIAGNOSIS — I5082 Biventricular heart failure: Secondary | ICD-10-CM | POA: Insufficient documentation

## 2023-07-08 LAB — BASIC METABOLIC PANEL
Anion gap: 10 (ref 5–15)
BUN: 17 mg/dL (ref 6–20)
CO2: 26 mmol/L (ref 22–32)
Calcium: 8.6 mg/dL — ABNORMAL LOW (ref 8.9–10.3)
Chloride: 97 mmol/L — ABNORMAL LOW (ref 98–111)
Creatinine, Ser: 1.54 mg/dL — ABNORMAL HIGH (ref 0.61–1.24)
GFR, Estimated: 55 mL/min — ABNORMAL LOW (ref >60)
Glucose, Bld: 191 mg/dL — ABNORMAL HIGH (ref 70–99)
Potassium: 3.6 mmol/L (ref 3.5–5.1)
Sodium: 133 mmol/L — ABNORMAL LOW (ref 135–145)

## 2023-07-08 LAB — DIGOXIN LEVEL: Digoxin Level: 0.3 ng/mL — ABNORMAL LOW (ref 0.8–2.0)

## 2023-07-09 ENCOUNTER — Other Ambulatory Visit (HOSPITAL_COMMUNITY): Payer: Self-pay

## 2023-07-12 ENCOUNTER — Other Ambulatory Visit (HOSPITAL_COMMUNITY): Payer: Self-pay

## 2023-07-14 ENCOUNTER — Ambulatory Visit: Payer: Medicaid Other | Attending: Nurse Practitioner

## 2023-07-14 ENCOUNTER — Telehealth: Payer: Self-pay | Admitting: Nurse Practitioner

## 2023-07-14 DIAGNOSIS — I5020 Unspecified systolic (congestive) heart failure: Secondary | ICD-10-CM | POA: Diagnosis not present

## 2023-07-14 DIAGNOSIS — I4891 Unspecified atrial fibrillation: Secondary | ICD-10-CM

## 2023-07-14 MED ORDER — PERFLUTREN LIPID MICROSPHERE
1.0000 mL | INTRAVENOUS | Status: AC | PRN
  Administered 2023-07-14: 5 mL via INTRAVENOUS

## 2023-07-14 NOTE — Telephone Encounter (Signed)
 Pt in office today for an echo and he stated that he needs new RX sent in for all the meds they prescribed him in the hospital. I went over his med list with him and the only ones he does not need is torsemide and benzonatate.

## 2023-07-14 NOTE — Telephone Encounter (Signed)
 Detailed message left on voice mail that all his prescriptions were sent to [facility] - if he was needing them to go to local pharmacy, need to clarify if Liberty Handy is correct.    Left message to return call OR he may reply via mychart.

## 2023-07-15 ENCOUNTER — Other Ambulatory Visit: Payer: Medicaid Other

## 2023-07-15 MED ORDER — SACUBITRIL-VALSARTAN 24-26 MG PO TABS: 1.0000 | ORAL_TABLET | Freq: Two times a day (BID) | ORAL | 0 refills | Status: DC

## 2023-07-15 MED ORDER — PANTOPRAZOLE SODIUM 40 MG PO TBEC: 40.0000 mg | DELAYED_RELEASE_TABLET | Freq: Every day | ORAL | 0 refills | Status: DC

## 2023-07-15 MED ORDER — DIGOXIN 125 MCG PO TABS: 0.1250 mg | ORAL_TABLET | Freq: Every day | ORAL | 0 refills | Status: DC

## 2023-07-15 MED ORDER — APIXABAN 5 MG PO TABS: 5.0000 mg | ORAL_TABLET | Freq: Two times a day (BID) | ORAL | 2 refills | Status: DC

## 2023-07-15 MED ORDER — AMIODARONE HCL 200 MG PO TABS: ORAL_TABLET | ORAL | 0 refills | Status: DC

## 2023-07-15 MED ORDER — EMPAGLIFLOZIN 10 MG PO TABS: 10.0000 mg | ORAL_TABLET | Freq: Every day | ORAL | 0 refills | Status: DC

## 2023-07-15 MED ORDER — ROSUVASTATIN CALCIUM 40 MG PO TABS: 40.0000 mg | ORAL_TABLET | Freq: Every day | ORAL | 0 refills | Status: DC

## 2023-07-15 MED ORDER — SPIRONOLACTONE 25 MG PO TABS: 25.0000 mg | ORAL_TABLET | Freq: Every day | ORAL | 0 refills | Status: DC

## 2023-07-15 NOTE — Telephone Encounter (Signed)
 All cardiac medications filled for 30 days until his appointment with peck  Will send to pharm d to fill eliquis

## 2023-07-15 NOTE — Telephone Encounter (Signed)
 Patient returned staff call and stated he will need his prescriptions sent to [facility] [address] [place] - [address].

## 2023-07-15 NOTE — Progress Notes (Addendum)
 New Patient Office Visit  Subjective   Patient ID: Cole Singleton, male    DOB: [birth year]  Age: 48 y.o. MRN: [id number]  CC:  Chief Complaint  Patient presents with   Establish Care    HPI Cole Singleton 48 year old male presents July 19, 2023 to establish care no concern.PMH of HFrEF/presumed NICM, hypertension, morbid obesity, OSA  Cole Singleton reports that he was recently diagnosed with heart failure after his recent hospital laxation in February 2025 with A-fib with RVR and acute on chronic CHF. Heart Failure The patient is a 48 year old morbidly obese male (368 lbs, BMI 47.35) with systolic heart failure (EF 20-25% per 10/2021 echo) on [place], Jardiance, and Amiodarone. He reports stable symptoms with mild exertional fatigue and SOB but denies PND, orthopnea, edema, chest pain, or palpitations. He is adherent to medications and working on dietary changes and weight loss. Recent Echo (10/2021): EF 20-25%, severely reduced LV function, dilated LV & LA, normal RV function, trivial MR, and dilated IVC. He denies worsening symptoms, dizziness, or syncop  HTN: History of hypertension, currently managed on Carvedilol (Coreg) 3.125 mg BID, Spironolactone (Aldactone) 12.5 mg daily, and Torsemide 60 mg BID. Today, his blood pressure is 130/77 mmHg, and he denies symptoms of dizziness, lightheadedness, headaches, vision changes, chest pain, or palpitations. He reports good adherence to his medication regimen and has been monitoring his blood pressure at home, with readings typically in the 120s-130s/70s-80s range. The patient is also working on dietary changes and weight loss to aid in blood pressure control. He has started reducing sodium intake but continues to struggle with weight loss. He denies significant lower extremity edema, shortness of breath, or fluid retention.or syncope. He also denies new lower extremity swelling, orthopnea, or worsening dyspnea. Morbid obese The patient is a  48 year old morbidly obese male (weight: 368 lbs, BMI: 47.35) recently diagnosed with heart failure. He is currently working on diet and exercise to facilitate weight loss. The patient reports increased fatigue and mild shortness of breath (SOB) with exertion, which he attributes to his current weight. He denies orthopnea, paroxysmal nocturnal dyspnea (PND), or significant lower extremity edema. No recent hospitalizations for heart failure exacerbation. The patient states he has started a low-sodium diet and is attempting to increase his physical activity, though prolonged activity remains challenging due to exertional symptoms. He expresses motivation to lose weight but struggles with dietary adherence. He denies chest pain, palpitations, dizziness, syncope, or new lower extremity swelling. No significant changes in urine output or recent episodes of fluid overload.  Dip tobacco 1-2 can daily Flowsheet Row Office Visit from 07/19/2023 in [facility]  PHQ-9 Total Score 3          07/19/2023    3:30 PM  GAD 7 : Generalized Anxiety Score  Nervous, Anxious, on Edge 0  Control/stop worrying 0  Worry too much - different things 0  Trouble relaxing 0  Restless 2  Easily annoyed or irritable 1  Afraid - awful might happen 0  Total GAD 7 Score 3  Anxiety Difficulty Not difficult at all      Outpatient Encounter Medications as of 07/19/2023  Medication Sig   amiodarone (PACERONE) 200 MG tablet Take 200 mg by mouth daily.   apixaban (ELIQUIS) 5 MG TABS tablet Take 1 tablet (5 mg total) by mouth 2 (two) times daily.   carvedilol (COREG) 3.125 MG tablet Take 1 tablet (3.125 mg total) by mouth 2 (two) times daily.   digoxin (LANOXIN)  0.125 MG tablet Take 1 tablet (0.125 mg total) by mouth daily.   empagliflozin (JARDIANCE) 10 MG TABS tablet Take 1 tablet (10 mg total) by mouth daily before breakfast.   pantoprazole (PROTONIX) 40 MG tablet Take 1 tablet (40 mg  total) by mouth daily at 12 noon.   rosuvastatin (CRESTOR) 40 MG tablet Take 1 tablet (40 mg total) by mouth daily.   sacubitril-valsartan (ENTRESTO) 24-26 MG Take 1 tablet by mouth 2 (two) times daily.   spironolactone (ALDACTONE) 25 MG tablet Take 0.5 tablets (12.5 mg total) by mouth daily.   torsemide (DEMADEX) 20 MG tablet Take 3 tablets (60 mg total) by mouth 2 (two) times daily.   [DISCONTINUED] amiodarone (PACERONE) 200 MG tablet Take 2 tablets (400 mg total) by mouth 2 (two) times daily for 7 days, THEN 1 tablet (200 mg total) daily for 23 days.   [DISCONTINUED] benzonatate (TESSALON) 100 MG capsule Take 100 mg by mouth 3 (three) times daily as needed.   [DISCONTINUED] spironolactone (ALDACTONE) 25 MG tablet Take 1 tablet (25 mg total) by mouth daily.   No facility-administered encounter medications on file as of 07/19/2023.    Past Medical History:  Diagnosis Date   Cardiomyopathy (HCC)    Cholelithiasis    Chronic HFrEF (heart failure with reduced ejection fraction) (HCC)    Diverticulosis    Hepatic steatosis    Hypertension    Morbid obesity (HCC)    OSA (obstructive sleep apnea)    Pneumonia    Pulmonary nodule     Past Surgical History:  Procedure Laterality Date   NO PAST SURGERIES     TRANSESOPHAGEAL ECHOCARDIOGRAM (CATH LAB) N/A 06/18/2023   Procedure: TRANSESOPHAGEAL ECHOCARDIOGRAM;  Surgeon: Romie Minus, MD;  Location: [facility];  Service: Cardiovascular;  Laterality: N/A;    Family History  Problem Relation Age of Onset   Hypertension Mother    Hypertension Father     Social History   Socioeconomic History   Marital status: Married    Spouse name: Not on file   Number of children: Not on file   Years of education: Not on file   Highest education level: Not on file  Occupational History   Not on file  Tobacco Use   Smoking status: Never   Smokeless tobacco: Current    Types: Snuff  Vaping Use   Vaping status: Never Used   Substance and Sexual Activity   Alcohol use: Yes    Comment: occas - 12 beers/yr   Drug use: No   Sexual activity: Not on file  Other Topics Concern   Not on file  Social History Narrative   Not on file   Social Drivers of Health   Financial Resource Strain: Not on file  Food Insecurity: No Food Insecurity (06/16/2023)   Hunger Vital Sign    Worried About Running Out of Food in the Last Year: Never true    Ran Out of Food in the Last Year: Never true  Transportation Needs: No Transportation Needs (06/16/2023)   PRAPARE - Administrator, Civil Service (Medical): No    Lack of Transportation (Non-Medical): No  Physical Activity: Not on file  Stress: Not on file  Social Connections: Not on file  Intimate Partner Violence: Not At Risk (06/16/2023)   Humiliation, Afraid, Rape, and Kick questionnaire    Fear of Current or Ex-Partner: No    Emotionally Abused: No    Physically Abused: No  Sexually Abused: No    Review of Systems  Constitutional:  Negative for chills and fever.  HENT:  Negative for congestion and ear pain.   Respiratory:  Negative for cough and shortness of breath.   Cardiovascular:  Negative for chest pain and leg swelling.  Gastrointestinal:  Negative for diarrhea, nausea and vomiting.  Skin:  Negative for itching and rash.  Neurological:  Negative for dizziness and headaches.   Negative unless indicated in HPI    Objective   BP 130/77   Pulse 73   Temp 98.2 F (36.8 C) (Temporal)   Ht 6\' 2"  (1.88 m)   Wt (!) 368 lb 12.8 oz (167.3 kg)   SpO2 (!) 9%   BMI 47.35 kg/m   Physical Exam Vitals and nursing note reviewed.  Constitutional:      General: He is not in acute distress.    Appearance: He is morbidly obese.  HENT:     Head: Normocephalic and atraumatic.     Right Ear: Tympanic membrane, ear canal and external ear normal. There is no impacted cerumen.     Left Ear: Tympanic membrane and ear canal normal. There is no impacted  cerumen.     Nose: Nose normal.     Mouth/Throat:     Mouth: Mucous membranes are moist.  Eyes:     General: No scleral icterus.    Extraocular Movements: Extraocular movements intact.     Conjunctiva/sclera: Conjunctivae normal.     Pupils: Pupils are equal, round, and reactive to light.  Neck:     Vascular: No carotid bruit.  Cardiovascular:     Heart sounds: Normal heart sounds.  Pulmonary:     Effort: Pulmonary effort is normal.     Breath sounds: Normal breath sounds.  Abdominal:     Palpations: Abdomen is soft.  Musculoskeletal:        General: Normal range of motion.     Cervical back: Normal range of motion and neck supple. No rigidity or tenderness.     Right lower leg: No edema.     Left lower leg: No edema.  Lymphadenopathy:     Cervical: No cervical adenopathy.  Skin:    General: Skin is warm and dry.  Neurological:     Mental Status: He is alert and oriented to person, place, and time.     Gait: Gait is intact.  Psychiatric:        Mood and Affect: Mood normal.        Behavior: Behavior normal.        Judgment: Judgment normal.     Last CBC Lab Results  Component Value Date   WBC 7.7 07/01/2023   HGB 15.2 07/01/2023   HCT 48.9 07/01/2023   MCV 76.2 (L) 07/01/2023   MCH 23.7 (L) 07/01/2023   RDW 19.1 (H) 07/01/2023   PLT 235 07/01/2023   Last metabolic panel Lab Results  Component Value Date   GLUCOSE 191 (H) 07/08/2023   NA 133 (L) 07/08/2023   K 3.6 07/08/2023   CL 97 (L) 07/08/2023   CO2 26 07/08/2023   BUN 17 07/08/2023   CREATININE 1.54 (H) 07/08/2023   GFRNONAA 55 (L) 07/08/2023   CALCIUM 8.6 (L) 07/08/2023   PHOS 3.7 06/17/2023   PROT 6.8 06/18/2023   ALBUMIN 3.5 06/18/2023   LABGLOB 2.6 03/24/2023   BILITOT 1.1 06/18/2023   ALKPHOS 53 06/18/2023   AST 28 06/18/2023   ALT 37 06/18/2023   ANIONGAP  10 07/08/2023   Last lipids Lab Results  Component Value Date   CHOL 161 06/17/2023   HDL 30 (L) 06/17/2023   LDLCALC 111 (H)  06/17/2023   TRIG 99 06/17/2023   CHOLHDL 5.4 06/17/2023   Last hemoglobin A1c Lab Results  Component Value Date   HGBA1C 6.5 (H) 06/18/2023   Last thyroid functions Lab Results  Component Value Date   TSH 3.584 06/16/2023   T4TOTAL 7.1 03/24/2023        Assessment & Plan:  Encounter for general adult medical examination with abnormal findings -     CMP14+EGFR -     CBC with Differential/Platelet -     Lipid panel -     Thyroid Panel With TSH  Primary hypertension -     CMP14+EGFR  Acute systolic heart failure (HCC)  Microcytic anemia -     CBC with Differential/Platelet  Obesity, Class III, BMI 40-49.9 (morbid obesity) (HCC) -     Lipid panel -     Thyroid Panel With TSH  Screening PSA (prostate specific antigen) -     PSA, total and free  Screening for colon cancer -     Cologuard  Cole Singleton is a [age] year old Caucasian male seen today To establish care, no acute distress HTN: Diet/heart healthy diet continue torsemide 60 mg twice a day, Aldactone 12.5 mg daily, client report 125 mg twice daily, monitor BP twice a day at home Systolic heart failure: Continue digoxin, Entresto and Jardiance as prescribed by cardiologist Macrocytic anemia: Recheck CBC Obesity: Continue working on diet and exercise Labs: CBC, CMP, lipid, TSH result pending Health maintenance: Cologuard, PSA ordered result pending Plan to continue all previously prescribed medication by previous PCP, no medication refills needed today Continue healthy lifestyle choices, including diet (rich in fruits, vegetables, and lean proteins, and low in salt and simple carbohydrates) and exercise (at least 30 minutes of moderate physical activity daily).     The above assessment and management plan was discussed with the patient. The patient verbalized understanding of and has agreed to the management plan. Patient is aware to call the clinic if they develop any new symptoms or if symptoms persist or  worsen. Patient is aware when to return to the clinic for a follow-up visit. Patient educated on when it is appropriate to go to the emergency department.  Return in about 3 months (around 10/18/2023).   Arrie Aran Santa Lighter, [facility] [address] [place], [place] [address] [phone]  Note: This document was prepared by Reubin Milan voice dictation technology and any errors that results from this process are unintentional.

## 2023-07-15 NOTE — Telephone Encounter (Signed)
 Left message stating medication refills sent to [facility] and to return or call if they need to go elsewhere

## 2023-07-16 ENCOUNTER — Telehealth (HOSPITAL_COMMUNITY): Payer: Self-pay | Admitting: Licensed Clinical Social Worker

## 2023-07-16 LAB — ECHOCARDIOGRAM COMPLETE
AR max vel: 1.89 cm2
AV Area VTI: 2.3 cm2
AV Area mean vel: 2.27 cm2
AV Mean grad: 4 mmHg
AV Peak grad: 8.4 mmHg
Ao pk vel: 1.45 m/s
Area-P 1/2: 4.17 cm2
Est EF: <20
MV VTI: 2.52 cm2
S' Lateral: 7.1 cm

## 2023-07-16 NOTE — Telephone Encounter (Signed)
 CSW attempted to call pt to follow up regarding questions he had during clinic visit regarding disability- unable to reach- left VM requesting return call  Burna Sis, LCSW Clinical Social Worker [facility] Desk#: [phone] Cell#: [phone]

## 2023-07-19 ENCOUNTER — Encounter: Payer: Self-pay | Admitting: Nurse Practitioner

## 2023-07-19 ENCOUNTER — Ambulatory Visit: Payer: Self-pay | Admitting: Nurse Practitioner

## 2023-07-19 ENCOUNTER — Ambulatory Visit: Payer: Medicaid Other | Attending: Nurse Practitioner | Admitting: Nurse Practitioner

## 2023-07-19 VITALS — BP 130/77 | HR 73 | Temp 98.2°F | Ht 74.0 in | Wt 368.8 lb

## 2023-07-19 VITALS — BP 114/88 | HR 64 | Ht 74.0 in | Wt 367.6 lb

## 2023-07-19 DIAGNOSIS — E66813 Obesity, class 3: Secondary | ICD-10-CM

## 2023-07-19 DIAGNOSIS — D509 Iron deficiency anemia, unspecified: Secondary | ICD-10-CM | POA: Diagnosis not present

## 2023-07-19 DIAGNOSIS — M791 Myalgia, unspecified site: Secondary | ICD-10-CM

## 2023-07-19 DIAGNOSIS — I10 Essential (primary) hypertension: Secondary | ICD-10-CM

## 2023-07-19 DIAGNOSIS — Z1211 Encounter for screening for malignant neoplasm of colon: Secondary | ICD-10-CM

## 2023-07-19 DIAGNOSIS — Z6841 Body Mass Index (BMI) 40.0 and over, adult: Secondary | ICD-10-CM

## 2023-07-19 DIAGNOSIS — I5021 Acute systolic (congestive) heart failure: Secondary | ICD-10-CM | POA: Diagnosis not present

## 2023-07-19 DIAGNOSIS — E785 Hyperlipidemia, unspecified: Secondary | ICD-10-CM

## 2023-07-19 DIAGNOSIS — I5020 Unspecified systolic (congestive) heart failure: Secondary | ICD-10-CM | POA: Diagnosis not present

## 2023-07-19 DIAGNOSIS — I4891 Unspecified atrial fibrillation: Secondary | ICD-10-CM

## 2023-07-19 DIAGNOSIS — Z125 Encounter for screening for malignant neoplasm of prostate: Secondary | ICD-10-CM

## 2023-07-19 DIAGNOSIS — Z0001 Encounter for general adult medical examination with abnormal findings: Secondary | ICD-10-CM

## 2023-07-19 MED ORDER — CARVEDILOL 3.125 MG PO TABS: 3.1250 mg | ORAL_TABLET | Freq: Two times a day (BID) | ORAL | 0 refills | Status: DC

## 2023-07-19 MED ORDER — SPIRONOLACTONE 25 MG PO TABS: 12.5000 mg | ORAL_TABLET | Freq: Every day | ORAL | 0 refills | Status: DC

## 2023-07-19 NOTE — Patient Instructions (Addendum)
 Medication Instructions:  Your physician has recommended you make the following change in your medication:  Hold Crestor (Rosuvastatin) for 1 week, then update [place] on symptoms.  Please Start Carvedilol 3.125 Mg Twice Daily.  Please reduce Spironolactone to 12.5 Mg Daily.   Labwork: None   Testing/Procedures: None   Follow-Up: Your physician recommends that you schedule a follow-up appointment in: 6 weeks   Any Other Special Instructions Will Be Listed Below (If Applicable).  If you need a refill on your cardiac medications before your next appointment, please call your pharmacy.

## 2023-07-19 NOTE — Progress Notes (Unsigned)
 Cardiology Office Note:  .   Date:  07/19/2023 ID:  Reginia Naas, DOB 06/15/1975, MRN [id number] PCP: Martina Sinner, NP  [facility] Providers Cardiologist:  Chrystie Nose, MD    History of Present Illness: .   Ghali Morissette is a 48 y.o. male with a PMH of HFrEF/presumed NICM, hypertension, HLD, morbid obesity, OSA, who presents today for overdue follow-up.  Last seen by Randall An, PA-C on January 28, 2022.  Was over doing well at the time, did note some intermittent dyspnea, denied any PND/orthopnea, as well as chest pain/palpitations.  It was noted he had a previous sleep study performed in July 2023 that revealed severe OSA, had not proceeded with arranging a CPAP and patient noted he was unsure if he be able to tolerate CPAP.  Was not interested in ICD placement at the time.   03/17/2023 - Today he presents for overdue follow-up.  He states he recently changed jobs and had to [place], said he ran out of medications and had a spell of heart failure symptoms that have since resolved since returning to taking his medications.  Says he could not tolerate spironolactone as this made him feel lightheaded/"drained."  Overall he is doing well from a cardiac perspective, does admit to recent allergies/sinus issues.  He is due for lab work to be drawn, says he is looking for primary care provider.  Hospitalized in February 2025 for A-fib with RVR and acute on chronic CHF exacerbation.  Was given Lovenox, started on a Cardizem drip, and received IV Lasix, IV Lopressor, and IV Solu-Medrol.  CXR revealed cardiomegaly with pulmonary edema.  Diltiazem drip was stopped, will start on amiodarone drip.  Echocardiogram in the hospital which showed EF 20%, no LV thrombus, severely reduced RV (new finding), and global hypokinesis.  Was followed by advanced heart failure team, had poor response to IV Lasix, found to have severe dyspnea during conversation, cool extremities  and narrow pulse pressure.  Found to be in A-fib with RVR.  Was started on milrinone and Lasix drip.  Did well with milrinone that was eventually weaned off, started on GDMT. See TEE report noted below.   Seen by heart failure clinic on July 01, 2023.  Was doing pretty good.  Weight at home was found to be 372 pounds.  Just admitted to doing dip.  Denied any alcohol, tobacco abuse, or illicit drug abuse.  Was recommended to add beta-blocker at next visit.  Today he presents for follow-up.  Overall doing well.  He admits to some generalized cramping and wonders if this is related to his medication. Denies any chest pain, shortness of breath, palpitations, syncope, presyncope, dizziness, orthopnea, PND, swelling or significant weight changes, acute bleeding, or claudication.  Weight is overall stable per his report. His weight is down 5 lbs from November 2024.  Compliant with his medications.  ROS: Negative. See HPI.   Studies Reviewed: Marland Kitchen    EKG: EKG is not ordered.  Echo 06/2023:  1. Left ventricular ejection fraction, by estimation, is <20%. The left  ventricle has severely decreased function. The left ventricle demonstrates global hypokinesis. The left ventricular internal cavity size was severely dilated. Left ventricular diastolic parameters are consistent with Grade I diastolic dysfunction (impaired relaxation). Elevated left atrial pressure.   2. Right ventricular systolic function is normal. The right ventricular  size is normal. Tricuspid regurgitation signal is inadequate for assessing  PA pressure.   3. The mitral valve is normal  in structure. No evidence of mitral valve  regurgitation. No evidence of mitral stenosis.   4. The aortic valve was not well visualized. Aortic valve regurgitation  is not visualized. No aortic stenosis is present.   5. Aortic dilatation noted. There is mild dilatation of the ascending  aorta, measuring 39 mm.   6. The inferior vena cava is normal in size  with greater than 50%  respiratory variability, suggesting right atrial pressure of 3 mmHg.   Comparison(s): A prior study was performed on 06/16/2023. EF 20%.Left  ventricle moderately to severely dilated.   TEE 05/2023:  Septal shift into the RV consistent with left sided volume overload.  Left ventricular ejection fraction, by estimation, is <20%. The left  ventricle has severely decreased function. The left ventricle demonstrates  global hypokinesis. The left  ventricular internal cavity size was moderately dilated.   2. Right ventricular systolic function is moderately reduced. The right  ventricular size is mildly enlarged. There is moderately elevated  pulmonary artery systolic pressure. The estimated right ventricular  systolic pressure is 56.0 mmHg.   3. Left atrial size was moderately dilated. A left atrial/left atrial  appendage thrombus was detected. The LAA emptying velocity was 28 cm/s.   4. Right atrial size was moderately dilated.   5. Moderate secondary MR. The mitral valve is normal in structure.  Moderate mitral valve regurgitation. No evidence of mitral stenosis.   6. Tricuspid valve regurgitation is mild to moderate.   7. The aortic valve is tricuspid. Aortic valve regurgitation is not  visualized.   8. The left upper pulmonary vein is abnormal.   FINDINGS   Left Ventricle: Septal shift into the RV consistent with left sided  volume overload. Left ventricular ejection fraction, by estimation, is  Echo 10/2021:  1. Left ventricular ejection fraction, by estimation, is 20 to 25%. The  left ventricle has severely decreased function. The left ventricle  demonstrates global hypokinesis. The left ventricular internal cavity size was severely dilated. Left ventricular diastolic parameters were normal.   2. Right ventricular systolic function is normal. The right ventricular  size is normal.   3. Left atrial size was moderately dilated.   4. The mitral valve is normal  in structure. Trivial mitral valve  regurgitation. No evidence of mitral stenosis.   5. The aortic valve is tricuspid. Aortic valve regurgitation is not  visualized. No aortic stenosis is present.   6. The inferior vena cava is dilated in size with >50% respiratory  variability, suggesting right atrial pressure of 8 mmHg.  Lexiscan 05/2017:  There was no ST segment deviation noted during stress. This is a high risk study. Risk is based on decreased LVEF. There is no current myocardium at jeopardy. The left ventricular ejection fraction is severely decreased (<30%). Moderate inferior defect consistent with possible prior infarct vs subdiaphragmatic attenuation. There is no current ischemia.  Physical Exam:   VS:  BP 114/88   Pulse 64   Ht 6\' 2"  (1.88 m)   Wt (!) 367 lb 9.6 oz (166.7 kg)   SpO2 92%   BMI 47.20 kg/m    Wt Readings from Last 3 Encounters:  07/19/23 (!) 368 lb 12.8 oz (167.3 kg)  07/19/23 (!) 367 lb 9.6 oz (166.7 kg)  07/01/23 (!) 379 lb 14.4 oz (172.3 kg)    GEN: Morbidly obese, 48 y.o. male in no acute distress NECK: No JVD; No carotid bruits CARDIAC: S1/S2, RRR, no murmurs, rubs, gallops RESPIRATORY:  Clear to auscultation  without rales, wheezing or rhonchi  ABDOMEN: Soft, non-tender, non-distended EXTREMITIES:  No edema; No deformity   ASSESSMENT AND PLAN: .    HFrEF Stage C, NYHA class I-II symptoms. EF <20%  06/2023. Euvolemic and well compensated on exam.  Continue Jardiance, Torsemide, Entresto, and will reduce Aldactone - see below. Will start Coreg 3.125 mg BID, tolerated this well in the past. Low sodium diet, fluid restriction <2L, and daily weights encouraged. Educated to contact our office for weight gain of 2 lbs overnight or 5 lbs in one week. Continue to follow-up with HF clinic as scheduled.   2. A-fib Patient chemically converted with amiodarone.  Continue amiodarone.  Was instructed by heart failure specialist on office visit on July 01, 2023 to  stop amiodarone when he runs out.  EKG will be obtained at next office visit with heart failure clinic.  Continue Eliquis for stroke prevention.  He is on appropriate dosage denies any bleeding issues.  3. HTN SBP stable. Discussed to monitor BP at home at least 2 hours after medications and sitting for 5-10 minutes.  Will begin Coreg 3.125 mg twice daily and will reduce Aldactone to 12.5 mg daily.  Continue rest of medication regimen.  Heart healthy diet and regular cardiovascular exercise encouraged.   4. Morbid obesity Weight loss via diet and exercise encouraged. Discussed the impact being overweight would have on cardiovascular risk.  He has been referred to clinical Pharm.D. for GLP-1.  He denies any personal or family history of thyroid cancer/medullary endocrine neoplasia syndrome type II.  5. Myalgias/cramping Admits to generalized cramping/myalgias.  Instructed him to hold Crestor for 1 week and he will write to [place] via MyChart in 1 week about his symptoms.  Reducing Aldactone as mentioned above.  He verbalized understanding.  Continue follow-up with PCP.  6. HLD He has pending labs.  Holding rosuvastatin for 1 week as mentioned above.  He will let us know at the end of 1 week how he is doing.  Continue follow-up with PCP.   Dispo: Will provide refills per his request.  Follow-up with me/APP in 6-8 weeks or sooner if anything changes.   Signed, Sharlene Dory, NP

## 2023-07-20 ENCOUNTER — Encounter: Payer: Self-pay | Admitting: Nurse Practitioner

## 2023-07-20 LAB — CMP14+EGFR
ALT: 41 IU/L (ref 0–44)
AST: 33 IU/L (ref 0–40)
Albumin: 4.4 g/dL (ref 4.1–5.1)
Alkaline Phosphatase: 105 IU/L (ref 44–121)
BUN/Creatinine Ratio: 11 (ref 9–20)
BUN: 16 mg/dL (ref 6–24)
Bilirubin Total: 0.4 mg/dL (ref 0.0–1.2)
CO2: 25 mmol/L (ref 20–29)
Calcium: 9.3 mg/dL (ref 8.7–10.2)
Chloride: 92 mmol/L — ABNORMAL LOW (ref 96–106)
Creatinine, Ser: 1.45 mg/dL — ABNORMAL HIGH (ref 0.76–1.27)
Globulin, Total: 2.8 g/dL (ref 1.5–4.5)
Glucose: 250 mg/dL — ABNORMAL HIGH (ref 70–99)
Potassium: 3.7 mmol/L (ref 3.5–5.2)
Sodium: 134 mmol/L (ref 134–144)
Total Protein: 7.2 g/dL (ref 6.0–8.5)
eGFR: 59 mL/min/{1.73_m2} — ABNORMAL LOW (ref >59)

## 2023-07-20 LAB — PSA, TOTAL AND FREE
PSA, Free Pct: 30 %
PSA, Free: 0.12 ng/mL
Prostate Specific Ag, Serum: 0.4 ng/mL (ref 0.0–4.0)

## 2023-07-20 LAB — THYROID PANEL WITH TSH
Free Thyroxine Index: 2.7 (ref 1.2–4.9)
T3 Uptake Ratio: 27 % (ref 24–39)
T4, Total: 10.1 ug/dL (ref 4.5–12.0)
TSH: 3.7 u[IU]/mL (ref 0.450–4.500)

## 2023-07-20 LAB — CBC WITH DIFFERENTIAL/PLATELET
Basophils Absolute: 0.1 10*3/uL (ref 0.0–0.2)
Basos: 1 %
EOS (ABSOLUTE): 0.1 10*3/uL (ref 0.0–0.4)
Eos: 1 %
Hematocrit: 49.6 % (ref 37.5–51.0)
Hemoglobin: 15.9 g/dL (ref 13.0–17.7)
Immature Grans (Abs): 0 10*3/uL (ref 0.0–0.1)
Immature Granulocytes: 0 %
Lymphocytes Absolute: 1 10*3/uL (ref 0.7–3.1)
Lymphs: 12 %
MCH: 24.9 pg — ABNORMAL LOW (ref 26.6–33.0)
MCHC: 32.1 g/dL (ref 31.5–35.7)
MCV: 78 fL — ABNORMAL LOW (ref 79–97)
Monocytes Absolute: 0.5 10*3/uL (ref 0.1–0.9)
Monocytes: 6 %
Neutrophils Absolute: 6.8 10*3/uL (ref 1.4–7.0)
Neutrophils: 80 %
Platelets: 255 10*3/uL (ref 150–450)
RBC: 6.39 x10E6/uL — ABNORMAL HIGH (ref 4.14–5.80)
RDW: 19.2 % — ABNORMAL HIGH (ref 11.6–15.4)
WBC: 8.5 10*3/uL (ref 3.4–10.8)

## 2023-07-20 LAB — LIPID PANEL
Chol/HDL Ratio: 3.7 ratio (ref 0.0–5.0)
Cholesterol, Total: 154 mg/dL (ref 100–199)
HDL: 42 mg/dL (ref >39)
LDL Chol Calc (NIH): 67 mg/dL (ref 0–99)
Triglycerides: 282 mg/dL — ABNORMAL HIGH (ref 0–149)
VLDL Cholesterol Cal: 45 mg/dL — ABNORMAL HIGH (ref 5–40)

## 2023-07-28 NOTE — Progress Notes (Signed)
 ADVANCED HF CLINIC CONSULT NOTE  Referring Physician: Martina Sinner, NP Primary Care: Evern Bio, Dois Davenport, NP Primary Cardiologist: Chrystie Nose, MD  Chief Complaint: Heart failure HPI: Cole Singleton is a 48 y.o. male with a hx of chronic HFrEF with presumed NICM, morbid obesity, OSA intolerant of CPAP, HTN, mild aneurysmal dilation of aorta previously seen by CT 2019, cholelithiasis, 3mm pulm nodule, severe hepatic steatosis, colonic diverticulosis, and ventral hernia.   Established care with [facility] in 2016. EF 20-25%. Lost to follow up until 2019. Nuclear stress in 2019 showed mod inferior defect consistent with prior infact. Has continued to follow with intermittent lapses in medication adherence.    Echo 7/23 showed EF 20-25% and gHK.    2/25 he developed viral URI and was treated with Augmentin, however symptoms worsened and he developed palpitations and abdominal bloating. He then took PRN Lasix without increase in UOP and developed progressive shortness of breath.    Presented to [facility] 06/15/23 where he was noted to be in atrial fibrillation with RVR. He was given Lovenox and started on IV diltiazem gtt, and given Lopressor 5 mg IV, Lasix IV 40 mg, solumedrol 40 mg. CXR with cardiomegaly with with pulm edema. Cardiology consulted. Dilt was stopped and he was started on amio gtt. Echo 2/25 showed EF 20%, gHK, no LV thrombus, severely reduced RV (newly reduced). AHF team consulted and found patient with conversational dyspnea, a poor response to IV lasix, cool extremities and narrow pulse pressure. He was still in AF with rates 120s-130s. PICC line was placed. He was started on milrinone and lasix gtt. Diuresed very well with milrinone and lasix gtt. Milrinone was able to be weaned off and he was started on GDMT.   Echo 3/25 EF <20%, GIDD, RV normal, no MR  Today he returns for AHF follow up. Overall feeling ***. Denies palpitations, CP, dizziness, edema,  or PND/Orthopnea. *** SOB. Appetite ok. No fever or chills. Weight at home *** pounds. Taking all medications. Denies ETOH, tobacco or drug use. Has established with PCP.   Past Medical History:  Diagnosis Date   Cardiomyopathy (HCC)    Cholelithiasis    Chronic HFrEF (heart failure with reduced ejection fraction) (HCC)    Diverticulosis    Hepatic steatosis    Hypertension    Morbid obesity (HCC)    OSA (obstructive sleep apnea)    Pneumonia    Pulmonary nodule    Current Outpatient Medications  Medication Sig Dispense Refill   amiodarone (PACERONE) 200 MG tablet Take 200 mg by mouth daily.     apixaban (ELIQUIS) 5 MG TABS tablet Take 1 tablet (5 mg total) by mouth 2 (two) times daily. 60 tablet 2   carvedilol (COREG) 3.125 MG tablet Take 1 tablet (3.125 mg total) by mouth 2 (two) times daily. 180 tablet 0   digoxin (LANOXIN) 0.125 MG tablet Take 1 tablet (0.125 mg total) by mouth daily. 30 tablet 0   empagliflozin (JARDIANCE) 10 MG TABS tablet Take 1 tablet (10 mg total) by mouth daily before breakfast. 30 tablet 0   pantoprazole (PROTONIX) 40 MG tablet Take 1 tablet (40 mg total) by mouth daily at 12 noon. 30 tablet 0   rosuvastatin (CRESTOR) 40 MG tablet Take 1 tablet (40 mg total) by mouth daily. 30 tablet 0   sacubitril-valsartan (ENTRESTO) 24-26 MG Take 1 tablet by mouth 2 (two) times daily. 60 tablet 0   spironolactone (ALDACTONE) 25 MG tablet Take 0.5  tablets (12.5 mg total) by mouth daily. 45 tablet 0   torsemide (DEMADEX) 20 MG tablet Take 3 tablets (60 mg total) by mouth 2 (two) times daily. 180 tablet 3   No current facility-administered medications for this visit.   Allergies  Allergen Reactions   Codeine Other (See Comments)    Severe headache Pt reports being able to take percocet & vicodin   Social History   Socioeconomic History   Marital status: Married    Spouse name: Not on file   Number of children: Not on file   Years of education: Not on file    Highest education level: Not on file  Occupational History   Not on file  Tobacco Use   Smoking status: Never   Smokeless tobacco: Current    Types: Snuff  Vaping Use   Vaping status: Never Used  Substance and Sexual Activity   Alcohol use: Yes    Comment: occas - 12 beers/yr   Drug use: No   Sexual activity: Not on file  Other Topics Concern   Not on file  Social History Narrative   Not on file   Social Drivers of Health   Financial Resource Strain: Not on file  Food Insecurity: No Food Insecurity (06/16/2023)   Hunger Vital Sign    Worried About Running Out of Food in the Last Year: Never true    Ran Out of Food in the Last Year: Never true  Transportation Needs: No Transportation Needs (06/16/2023)   PRAPARE - Administrator, Civil Service (Medical): No    Lack of Transportation (Non-Medical): No  Physical Activity: Not on file  Stress: Not on file  Social Connections: Not on file  Intimate Partner Violence: Not At Risk (06/16/2023)   Humiliation, Afraid, Rape, and Kick questionnaire    Fear of Current or Ex-Partner: No    Emotionally Abused: No    Physically Abused: No    Sexually Abused: No    Family History  Problem Relation Age of Onset   Hypertension Mother    Hypertension Father    There were no vitals filed for this visit.  PHYSICAL EXAM: General:  well appearing.  No respiratory difficulty. Walked into clinic HEENT: normal Neck: supple. JVD difficult to see.  Cor: PMI nondisplaced. Regular rate & rhythm. No rubs, gallops or murmurs. Lungs: clear Extremities: no cyanosis, clubbing, rash, trace BLE edema  Neuro: alert & oriented x 3. Moves all 4 extremities w/o difficulty. Affect pleasant.   Wt Readings from Last 3 Encounters:  07/19/23 (!) 167.3 kg (368 lb 12.8 oz)  07/19/23 (!) 166.7 kg (367 lb 9.6 oz)  07/01/23 (!) 172.3 kg (379 lb 14.4 oz)    ECG: NSR  (Personally reviewed 3/25)    ASSESSMENT & PLAN: Chronic biventricular heart  failure - EF previously 35%, documented as NICM. Can not rule out ICM with metabolic syndrome. Never had LHC.  - Nuclear Stress in 2019 showed moderate inferior defect consistent with prior infarct, though suspect attenuation artifact - Echo 2/25 EF 20%, gHK, no LV thrombus, severely reduced RV (newly reduced) - Echo 3/25 EF <20%, GIDD, RV normal, no MR - NYHA II-IIIa. Volume mildly overloaded. Weight up 17 lbs since discharged but completely asymptomatic.  *** - Increase torsemide to 60 mg BID - Continue digoxin 0.125 mg daily. 0.3 3/25 - Continue Jardiance 10 mg daily. Denies GU symptoms  - Continue Entresto 24-26 mg BID - Spiro decreased to 12.5 mg daily (didn't  tolerate higher dose) - Continue coreg 3.125 gm BID - BMET/BNP today. Repeat BMET in 7-10 days - Discussed cutting back fluid intake and wearing compression socks which he has at home.   Atrial fibrillation with hx of RVR - During recent admission, chemically converted with amiodarone - Today in NSR - Now off amiodarone. EKG with NSR 07/01/23 - Continue Eliquis 5 mg BID. Denies abnormal bleeding. CBC today   Elevated renal function - SCr baseline this last admission ~1.4 - BMET today  HTN - GDMT as above - BP stable today   Severe OSA - severe on 2023 sleep study - Does not have CPAP at home.  - Refer to Dr. Mayford Knife for CPAP  Obesity - There is no height or weight on file to calculate BMI.  - discussed need for weight loss - Has been referred to PharmD for GLP-1   HLD - LDL 111 - Continue crestor 40 mg daily *** held with myalgias  HFSW to call about disability.   Follow up in 1 month to reassess volume.   Alen Bleacher AGACNP-BC  07/28/23

## 2023-07-29 ENCOUNTER — Encounter (HOSPITAL_COMMUNITY): Payer: Self-pay

## 2023-07-29 ENCOUNTER — Ambulatory Visit (HOSPITAL_COMMUNITY)
Admission: RE | Admit: 2023-07-29 | Discharge: 2023-07-29 | Disposition: A | Discharge status: Home / Self Care | Source: Ambulatory Visit | Attending: Internal Medicine | Admitting: Internal Medicine

## 2023-07-29 VITALS — BP 146/28 | HR 74 | Ht 74.0 in | Wt 370.0 lb

## 2023-07-29 DIAGNOSIS — Z7984 Long term (current) use of oral hypoglycemic drugs: Secondary | ICD-10-CM | POA: Insufficient documentation

## 2023-07-29 DIAGNOSIS — I5042 Chronic combined systolic (congestive) and diastolic (congestive) heart failure: Secondary | ICD-10-CM

## 2023-07-29 DIAGNOSIS — Z79899 Other long term (current) drug therapy: Secondary | ICD-10-CM | POA: Insufficient documentation

## 2023-07-29 DIAGNOSIS — K760 Fatty (change of) liver, not elsewhere classified: Secondary | ICD-10-CM | POA: Insufficient documentation

## 2023-07-29 DIAGNOSIS — Z6841 Body Mass Index (BMI) 40.0 and over, adult: Secondary | ICD-10-CM | POA: Insufficient documentation

## 2023-07-29 DIAGNOSIS — E785 Hyperlipidemia, unspecified: Secondary | ICD-10-CM | POA: Diagnosis not present

## 2023-07-29 DIAGNOSIS — G4733 Obstructive sleep apnea (adult) (pediatric): Secondary | ICD-10-CM | POA: Insufficient documentation

## 2023-07-29 DIAGNOSIS — I5082 Biventricular heart failure: Secondary | ICD-10-CM | POA: Insufficient documentation

## 2023-07-29 DIAGNOSIS — I10 Essential (primary) hypertension: Secondary | ICD-10-CM | POA: Diagnosis not present

## 2023-07-29 DIAGNOSIS — I429 Cardiomyopathy, unspecified: Secondary | ICD-10-CM | POA: Diagnosis not present

## 2023-07-29 DIAGNOSIS — I4891 Unspecified atrial fibrillation: Secondary | ICD-10-CM | POA: Insufficient documentation

## 2023-07-29 DIAGNOSIS — N289 Disorder of kidney and ureter, unspecified: Secondary | ICD-10-CM | POA: Diagnosis not present

## 2023-07-29 DIAGNOSIS — R0609 Other forms of dyspnea: Secondary | ICD-10-CM | POA: Diagnosis not present

## 2023-07-29 DIAGNOSIS — Z7901 Long term (current) use of anticoagulants: Secondary | ICD-10-CM | POA: Insufficient documentation

## 2023-07-29 DIAGNOSIS — I5022 Chronic systolic (congestive) heart failure: Secondary | ICD-10-CM

## 2023-07-29 DIAGNOSIS — I110 Hypertensive heart disease with heart failure: Secondary | ICD-10-CM | POA: Diagnosis not present

## 2023-07-29 DIAGNOSIS — R911 Solitary pulmonary nodule: Secondary | ICD-10-CM | POA: Insufficient documentation

## 2023-07-29 DIAGNOSIS — I428 Other cardiomyopathies: Secondary | ICD-10-CM | POA: Diagnosis not present

## 2023-07-29 LAB — BRAIN NATRIURETIC PEPTIDE: B Natriuretic Peptide: 76.5 pg/mL (ref 0.0–100.0)

## 2023-07-29 LAB — BASIC METABOLIC PANEL WITH GFR
Anion gap: 13 (ref 5–15)
BUN: 8 mg/dL (ref 6–20)
CO2: 24 mmol/L (ref 22–32)
Calcium: 9.1 mg/dL (ref 8.9–10.3)
Chloride: 99 mmol/L (ref 98–111)
Creatinine, Ser: 1.31 mg/dL — ABNORMAL HIGH (ref 0.61–1.24)
GFR, Estimated: >60 mL/min (ref >60)
Glucose, Bld: 222 mg/dL — ABNORMAL HIGH (ref 70–99)
Potassium: 3.2 mmol/L — ABNORMAL LOW (ref 3.5–5.1)
Sodium: 136 mmol/L (ref 135–145)

## 2023-07-29 MED ORDER — METOLAZONE 2.5 MG PO TABS: 2.5000 mg | ORAL_TABLET | Freq: Every day | ORAL | 0 refills | Status: DC

## 2023-07-29 MED ORDER — POTASSIUM CHLORIDE CRYS ER 20 MEQ PO TBCR: 20.0000 meq | EXTENDED_RELEASE_TABLET | Freq: Two times a day (BID) | ORAL | 0 refills | Status: DC

## 2023-07-29 NOTE — Patient Instructions (Addendum)
 Thank you for coming in today  If you had labs drawn today, any labs that are abnormal the clinic will call you No news is good news  Medications: Take Metolazone 2.5 mg 1 tablet and 40 meq of Potassium today.  Follow up appointments:  Your physician recommends that you schedule a follow-up appointment in:  2-3 weeks in clinic   Do the following things EVERYDAY: Weigh yourself in the morning before breakfast. Write it down and keep it in a log. Take your medicines as prescribed Eat low salt foods--Limit salt (sodium) to 2000 mg per day.  Stay as active as you can everyday Limit all fluids for the day to less than 2 liters   At the [facility], you and your health needs are our priority. As part of our continuing mission to provide you with exceptional heart care, we have created designated Provider Care Teams. These Care Teams include your primary Cardiologist (physician) and Advanced Practice Providers (APPs- Physician Assistants and Nurse Practitioners) who all work together to provide you with the care you need, when you need it.   You may see any of the following providers on your designated Care Team at your next follow up: Dr Arvilla Meres Dr Marca Ancona Dr. Marcos Eke, NP Robbie Lis, [place] [facility] [place], [place] Brynda Peon, NP Karle Plumber, PharmD   Please be sure to bring in all your medications bottles to every appointment.    Thank you for choosing [facility]  If you have any questions or concerns before your next appointment please send [place] a message through [place] or call our office at [phone].    TO LEAVE A MESSAGE FOR THE NURSE SELECT OPTION 2, PLEASE LEAVE A MESSAGE INCLUDING: YOUR NAME DATE OF BIRTH CALL BACK NUMBER REASON FOR CALL**this is important as we prioritize the call backs  YOU WILL RECEIVE A CALL BACK THE SAME DAY AS LONG AS YOU CALL BEFORE  4:00 PM

## 2023-07-29 NOTE — Progress Notes (Signed)
 ReDS Vest / Clip - 07/29/23 1457       ReDS Vest / Clip   Station Marker D    Ruler Value 46    ReDS Value Range High volume overload    ReDS Actual Value 47

## 2023-07-30 ENCOUNTER — Telehealth (HOSPITAL_COMMUNITY): Payer: Self-pay

## 2023-07-30 ENCOUNTER — Other Ambulatory Visit (HOSPITAL_COMMUNITY): Payer: Self-pay

## 2023-07-30 ENCOUNTER — Encounter (HOSPITAL_COMMUNITY): Payer: Self-pay

## 2023-07-30 DIAGNOSIS — I5042 Chronic combined systolic (congestive) and diastolic (congestive) heart failure: Secondary | ICD-10-CM

## 2023-07-30 NOTE — Telephone Encounter (Signed)
-----   Message from Alen Bleacher sent at 07/29/2023  4:51 PM EDT ----- Potassium is low. Increase daily KDUR to 40 mEq BID. Take additional 60 mEq KDUR with metolazone. Repeat BMET in 1 week.

## 2023-08-05 LAB — COLOGUARD: COLOGUARD: NEGATIVE

## 2023-08-10 ENCOUNTER — Other Ambulatory Visit (HOSPITAL_COMMUNITY)
Admission: RE | Admit: 2023-08-10 | Discharge: 2023-08-10 | Disposition: A | Discharge status: Home / Self Care | Source: Ambulatory Visit | Attending: Internal Medicine | Admitting: Internal Medicine

## 2023-08-10 ENCOUNTER — Telehealth (HOSPITAL_COMMUNITY): Payer: Self-pay | Admitting: Cardiology

## 2023-08-10 DIAGNOSIS — I5042 Chronic combined systolic (congestive) and diastolic (congestive) heart failure: Secondary | ICD-10-CM | POA: Insufficient documentation

## 2023-08-10 LAB — BASIC METABOLIC PANEL WITH GFR
Anion gap: 14 (ref 5–15)
BUN: 13 mg/dL (ref 6–20)
CO2: 26 mmol/L (ref 22–32)
Calcium: 8.9 mg/dL (ref 8.9–10.3)
Chloride: 90 mmol/L — ABNORMAL LOW (ref 98–111)
Creatinine, Ser: 1.42 mg/dL — ABNORMAL HIGH (ref 0.61–1.24)
GFR, Estimated: >60 mL/min (ref >60)
Glucose, Bld: 374 mg/dL — ABNORMAL HIGH (ref 70–99)
Potassium: 3.1 mmol/L — ABNORMAL LOW (ref 3.5–5.1)
Sodium: 130 mmol/L — ABNORMAL LOW (ref 135–145)

## 2023-08-10 MED ORDER — POTASSIUM CHLORIDE CRYS ER 20 MEQ PO TBCR: 40.0000 meq | EXTENDED_RELEASE_TABLET | Freq: Two times a day (BID) | ORAL | 6 refills | Status: DC

## 2023-08-10 NOTE — Telephone Encounter (Signed)
-----   Message from Sheryl Donna sent at 08/10/2023  1:34 PM EDT ----- K persistently low. Increase KDUR to 40 mEq BID. Please update prescription (this was supposed to be changed 4/10, still says 20 mEq BID). If he's taking 40 mEq BID>>Increase to 60 mEq BID. Take additional 40 mEq today for one time extra dose. Plan for repeat lab work at follow up on the 30th.

## 2023-08-10 NOTE — Telephone Encounter (Signed)
 Patient called.  Patient aware. Pt reports his KCL is 20 meq twice a day (1 in the AM and 1 in the PM) Advised of  4/22 PM dose of 60 meq x 1 starting 4/23 40 meq BID

## 2023-08-17 ENCOUNTER — Telehealth (HOSPITAL_COMMUNITY): Payer: Self-pay | Admitting: *Deleted

## 2023-08-17 NOTE — Telephone Encounter (Signed)
 Called to confirm/remind patient of their appointment at the [facility] on 07/23/23.    Appointment:              [] Confirmed             [x] Left mess              [] No answer/No voice mail             [] Phone not in service   Patient reminded to bring all medications and/or complete list.   Confirmed patient has transportation. Gave directions, instructed to utilize valet parking.

## 2023-08-18 ENCOUNTER — Telehealth (HOSPITAL_COMMUNITY): Payer: Self-pay

## 2023-08-18 ENCOUNTER — Encounter (HOSPITAL_COMMUNITY): Payer: Self-pay

## 2023-08-18 ENCOUNTER — Ambulatory Visit (HOSPITAL_COMMUNITY)
Admission: RE | Admit: 2023-08-18 | Discharge: 2023-08-18 | Disposition: A | Discharge status: Home / Self Care | Source: Ambulatory Visit | Attending: Family Medicine | Admitting: Family Medicine

## 2023-08-18 VITALS — BP 110/66 | HR 93 | Wt 361.6 lb

## 2023-08-18 DIAGNOSIS — I428 Other cardiomyopathies: Secondary | ICD-10-CM | POA: Insufficient documentation

## 2023-08-18 DIAGNOSIS — K760 Fatty (change of) liver, not elsewhere classified: Secondary | ICD-10-CM | POA: Diagnosis not present

## 2023-08-18 DIAGNOSIS — K439 Ventral hernia without obstruction or gangrene: Secondary | ICD-10-CM | POA: Insufficient documentation

## 2023-08-18 DIAGNOSIS — I130 Hypertensive heart and chronic kidney disease with heart failure and stage 1 through stage 4 chronic kidney disease, or unspecified chronic kidney disease: Secondary | ICD-10-CM | POA: Insufficient documentation

## 2023-08-18 DIAGNOSIS — F17290 Nicotine dependence, other tobacco product, uncomplicated: Secondary | ICD-10-CM | POA: Diagnosis not present

## 2023-08-18 DIAGNOSIS — Z7984 Long term (current) use of oral hypoglycemic drugs: Secondary | ICD-10-CM | POA: Insufficient documentation

## 2023-08-18 DIAGNOSIS — Z79899 Other long term (current) drug therapy: Secondary | ICD-10-CM | POA: Diagnosis not present

## 2023-08-18 DIAGNOSIS — I480 Paroxysmal atrial fibrillation: Secondary | ICD-10-CM | POA: Diagnosis not present

## 2023-08-18 DIAGNOSIS — E1122 Type 2 diabetes mellitus with diabetic chronic kidney disease: Secondary | ICD-10-CM | POA: Diagnosis not present

## 2023-08-18 DIAGNOSIS — I5082 Biventricular heart failure: Secondary | ICD-10-CM | POA: Insufficient documentation

## 2023-08-18 DIAGNOSIS — I5022 Chronic systolic (congestive) heart failure: Secondary | ICD-10-CM | POA: Diagnosis not present

## 2023-08-18 DIAGNOSIS — E785 Hyperlipidemia, unspecified: Secondary | ICD-10-CM | POA: Diagnosis not present

## 2023-08-18 DIAGNOSIS — N182 Chronic kidney disease, stage 2 (mild): Secondary | ICD-10-CM | POA: Insufficient documentation

## 2023-08-18 DIAGNOSIS — Z7901 Long term (current) use of anticoagulants: Secondary | ICD-10-CM | POA: Diagnosis not present

## 2023-08-18 DIAGNOSIS — I10 Essential (primary) hypertension: Secondary | ICD-10-CM | POA: Diagnosis not present

## 2023-08-18 DIAGNOSIS — G4733 Obstructive sleep apnea (adult) (pediatric): Secondary | ICD-10-CM | POA: Insufficient documentation

## 2023-08-18 DIAGNOSIS — Z6841 Body Mass Index (BMI) 40.0 and over, adult: Secondary | ICD-10-CM | POA: Insufficient documentation

## 2023-08-18 DIAGNOSIS — Z794 Long term (current) use of insulin: Secondary | ICD-10-CM

## 2023-08-18 LAB — BASIC METABOLIC PANEL WITH GFR
Anion gap: 13 (ref 5–15)
BUN: 14 mg/dL (ref 6–20)
CO2: 24 mmol/L (ref 22–32)
Calcium: 9.1 mg/dL (ref 8.9–10.3)
Chloride: 93 mmol/L — ABNORMAL LOW (ref 98–111)
Creatinine, Ser: 1.45 mg/dL — ABNORMAL HIGH (ref 0.61–1.24)
GFR, Estimated: 59 mL/min — ABNORMAL LOW (ref >60)
Glucose, Bld: 368 mg/dL — ABNORMAL HIGH (ref 70–99)
Potassium: 3.6 mmol/L (ref 3.5–5.1)
Sodium: 130 mmol/L — ABNORMAL LOW (ref 135–145)

## 2023-08-18 LAB — CBC
HCT: 46.6 % (ref 39.0–52.0)
Hemoglobin: 15.5 g/dL (ref 13.0–17.0)
MCH: 25 pg — ABNORMAL LOW (ref 26.0–34.0)
MCHC: 33.3 g/dL (ref 30.0–36.0)
MCV: 75.2 fL — ABNORMAL LOW (ref 80.0–100.0)
Platelets: 254 10*3/uL (ref 150–400)
RBC: 6.2 MIL/uL — ABNORMAL HIGH (ref 4.22–5.81)
RDW: 18.7 % — ABNORMAL HIGH (ref 11.5–15.5)
WBC: 8.9 10*3/uL (ref 4.0–10.5)
nRBC: 0 % (ref 0.0–0.2)

## 2023-08-18 LAB — IRON AND TIBC
Iron: 87 ug/dL (ref 45–182)
Saturation Ratios: 24 % (ref 17.9–39.5)
TIBC: 364 ug/dL (ref 250–450)
UIBC: 277 ug/dL

## 2023-08-18 LAB — FERRITIN: Ferritin: 185 ng/mL (ref 24–336)

## 2023-08-18 LAB — BRAIN NATRIURETIC PEPTIDE: B Natriuretic Peptide: 44.2 pg/mL (ref 0.0–100.0)

## 2023-08-18 LAB — DIGOXIN LEVEL: Digoxin Level: 0.5 ng/mL — ABNORMAL LOW (ref 0.8–2.0)

## 2023-08-18 MED ORDER — ROSUVASTATIN CALCIUM 40 MG PO TABS: 40.0000 mg | ORAL_TABLET | Freq: Every day | ORAL | 3 refills | Status: AC

## 2023-08-18 MED ORDER — SPIRONOLACTONE 25 MG PO TABS: 25.0000 mg | ORAL_TABLET | Freq: Every day | ORAL | 3 refills | Status: AC

## 2023-08-18 MED ORDER — POTASSIUM CHLORIDE CRYS ER 20 MEQ PO TBCR: 40.0000 meq | EXTENDED_RELEASE_TABLET | Freq: Two times a day (BID) | ORAL | 6 refills | Status: DC

## 2023-08-18 MED ORDER — APIXABAN 5 MG PO TABS
5.0000 mg | ORAL_TABLET | Freq: Two times a day (BID) | ORAL | 3 refills | Status: AC
Start: 2023-08-18 — End: ?

## 2023-08-18 MED ORDER — EMPAGLIFLOZIN 10 MG PO TABS: 10.0000 mg | ORAL_TABLET | Freq: Every day | ORAL | 3 refills | Status: DC

## 2023-08-18 MED ORDER — TORSEMIDE 20 MG PO TABS: 60.0000 mg | ORAL_TABLET | Freq: Two times a day (BID) | ORAL | 3 refills | Status: DC

## 2023-08-18 MED ORDER — AMIODARONE HCL 200 MG PO TABS: 200.0000 mg | ORAL_TABLET | Freq: Every day | ORAL | 3 refills | Status: DC

## 2023-08-18 MED ORDER — CARVEDILOL 3.125 MG PO TABS: 3.1250 mg | ORAL_TABLET | Freq: Two times a day (BID) | ORAL | 3 refills | Status: DC

## 2023-08-18 MED ORDER — SACUBITRIL-VALSARTAN 24-26 MG PO TABS: 1.0000 | ORAL_TABLET | Freq: Two times a day (BID) | ORAL | 3 refills | Status: DC

## 2023-08-18 NOTE — Progress Notes (Signed)
 ADVANCED HF CLINIC CONSULT NOTE   Primary Care: Villa Greaser, Adell Hones, NP Primary Cardiologist: Hazle Lites, MD HF Cardiologist: Dr. Alease Amend  HPI: Cole Singleton is a 48 y.o. male with a hx of chronic HFrEF with presumed NICM, morbid obesity, OSA intolerant of CPAP, HTN, mild aneurysmal dilation of aorta previously seen by CT 2019, cholelithiasis, 3mm pulm nodule, severe hepatic steatosis, colonic diverticulosis, and ventral hernia.   Established care with [facility] in 2016. EF 20-25%. Lost to follow up until 2019. Nuclear stress in 2019 showed mod inferior defect consistent with prior infact. Has continued to follow with intermittent lapses in medication adherence.    Echo 7/23 showed EF 20-25% and gHK.    2/25 he developed viral URI and was treated with Augmentin, however symptoms worsened and he developed palpitations and abdominal bloating. He then took PRN Lasix  without increase in UOP and developed progressive shortness of breath.    Admitted 2/25 with AF with RVR. He was given Lovenox , IV lasix  and lopressor  and started on IV Dilt gtts. Cardiology consulted. Dilt was stopped and he was started on amio gtt. Echo 2/25 showed EF 20%, gHK, no LV thrombus, severely reduced RV (newly reduced). AHF team consulted, patient felt to be decompensating. He remained in AF with rates 120s-130s. PICC line was placed, started on milrinone  and lasix  gtts. TEE/DCCV showed LVEF <20%, RV moderately down and + thrombus, so no DCCV. Chemically converted to NSR. Cath deferred given conversion. Drips weaned and GDMT titrated. He was discharged home, weight 362 lbs.  Today he returns for HF follow up. Overall feeling fatigued. No SOB with ADLs or walking around the house. Denies palpitations, abnormal bleeding, CP, dizziness, edema, or PND/Orthopnea. Appetite ok. No fever or chills. Weight at home 358-360 pounds. Taking all medications. Previously worked Theatre stage manager job. Now working on  disability now. He dips, no smoking or ETOH use.   Cardiac Studies - Echo 3/25: EF < 20%, normal RV, no MR  - TEE 2/25: LVEF < 20%, RVEF moderately reduced, + LAA thrombus, moderate secondary MR  - Echo 2/25: EF 20%, RV severely reduced  - Echo 7/23: EF 20-25%  - Nuclear Stress Test: mod inferior defect consistent with prior infarct  - Echo 2016: EF 20-25%  Past Medical History:  Diagnosis Date   Cardiomyopathy (HCC)    Cholelithiasis    Chronic HFrEF (heart failure with reduced ejection fraction) (HCC)    Diverticulosis    Hepatic steatosis    Hypertension    Morbid obesity (HCC)    OSA (obstructive sleep apnea)    Pneumonia    Pulmonary nodule    Current Outpatient Medications  Medication Sig Dispense Refill   amiodarone  (PACERONE ) 200 MG tablet Take 200 mg by mouth daily.     apixaban  (ELIQUIS ) 5 MG TABS tablet Take 1 tablet (5 mg total) by mouth 2 (two) times daily. 60 tablet 2   carvedilol  (COREG ) 3.125 MG tablet Take 1 tablet (3.125 mg total) by mouth 2 (two) times daily. 180 tablet 0   digoxin  (LANOXIN ) 0.125 MG tablet Take 1 tablet (0.125 mg total) by mouth daily. 30 tablet 0   empagliflozin  (JARDIANCE ) 10 MG TABS tablet Take 1 tablet (10 mg total) by mouth daily before breakfast. 30 tablet 0   pantoprazole  (PROTONIX ) 40 MG tablet Take 1 tablet (40 mg total) by mouth daily at 12 noon. 30 tablet 0   potassium chloride  SA (KLOR-CON  M) 20 MEQ tablet Take 2 tablets (40  mEq total) by mouth 2 (two) times daily. 120 tablet 6   rosuvastatin  (CRESTOR ) 40 MG tablet Take 1 tablet (40 mg total) by mouth daily. 30 tablet 0   sacubitril -valsartan  (ENTRESTO ) 24-26 MG Take 1 tablet by mouth 2 (two) times daily. 60 tablet 0   spironolactone  (ALDACTONE ) 25 MG tablet Take 0.5 tablets (12.5 mg total) by mouth daily. (Patient taking differently: Take 25 mg by mouth daily.) 45 tablet 0   torsemide  (DEMADEX ) 20 MG tablet Take 3 tablets (60 mg total) by mouth 2 (two) times daily. 180 tablet 3    No current facility-administered medications for this encounter.   Allergies  Allergen Reactions   Codeine Other (See Comments)    Severe headache Pt reports being able to take percocet & vicodin   Social History   Socioeconomic History   Marital status: Married    Spouse name: Not on file   Number of children: Not on file   Years of education: Not on file   Highest education level: Not on file  Occupational History   Not on file  Tobacco Use   Smoking status: Never   Smokeless tobacco: Current    Types: Snuff  Vaping Use   Vaping status: Never Used  Substance and Sexual Activity   Alcohol use: Yes    Comment: occas - 12 beers/yr   Drug use: No   Sexual activity: Not on file  Other Topics Concern   Not on file  Social History Narrative   Not on file   Social Drivers of Health   Financial Resource Strain: Not on file  Food Insecurity: No Food Insecurity (06/16/2023)   Hunger Vital Sign    Worried About Running Out of Food in the Last Year: Never true    Ran Out of Food in the Last Year: Never true  Transportation Needs: No Transportation Needs (06/16/2023)   PRAPARE - Administrator, Civil Service (Medical): No    Lack of Transportation (Non-Medical): No  Physical Activity: Not on file  Stress: Not on file  Social Connections: Not on file  Intimate Partner Violence: Not At Risk (06/16/2023)   Humiliation, Afraid, Rape, and Kick questionnaire    Fear of Current or Ex-Partner: No    Emotionally Abused: No    Physically Abused: No    Sexually Abused: No    Family History  Problem Relation Age of Onset   Hypertension Mother    Hypertension Father    Wt Readings from Last 3 Encounters:  08/18/23 (!) 164 kg (361 lb 9.6 oz)  07/29/23 (!) 167.8 kg (370 lb)  07/19/23 (!) 167.3 kg (368 lb 12.8 oz)   BP 110/66   Pulse 93   Wt (!) 164 kg (361 lb 9.6 oz)   SpO2 97%   BMI 46.43 kg/m   PHYSICAL EXAM: General:  NAD. No resp difficulty, walked into  clinic HEENT: Normal Neck: Supple. No JVD. Cor: Regular rate & rhythm. No rubs, gallops or murmurs. Lungs: Clear Abdomen: Soft, obese, nontender, nondistended.  Extremities: No cyanosis, clubbing, rash, edema Neuro: Alert & oriented x 3, moves all 4 extremities w/o difficulty. Affect pleasant.  ASSESSMENT & PLAN: Chronic biventricular heart failure - EF previously 35%, documented as NICM. Can not rule out ICM with metabolic syndrome. Never had LHC.  - Nuclear Stress in 2019 showed moderate inferior defect consistent with prior infarct, though suspect attenuation artifact - Echo 2/25 EF: 20%, gHK, no LV thrombus, severely reduced RV (newly  reduced) - Echo 3/25 EF: < 20%, GIDD, RV normal, no MR - NYHA II. Volume stable. - Continue torsemide  60 mg bid + 40 KCL bid - Continue digoxin  0.125 mg daily. Dig level 0.3 07/08/23 - Continue Jardiance  10 mg daily. Denies GU symptoms  - Continue Entresto  24-26 mg bid - Continue spiro 25 mg daily. - Continue Coreg  3.125 mg bid - Repeat echo next visit. Consider formal ischemic eval if no improvement in EF with maintenance of SR and GDMT - Labs today.  PAF - During recent admission with RVR, chemically converted with amiodarone  - Regular on exam today - Continue amiodarone  200 mg daily.  - Continue Eliquis  5 mg bid. - LFTs and TSH normal (06/2023) - CBC and iron  panel today.  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  - Has been referred to Dr. Micael Adas for CPAP.  Obesity - Body mass index is 46.43 kg/m.  - Has been referred to PharmD for GLP-1 (appt scheduled in May)  HLD - LDL 67 (3/25) - Continue statin  8. DM2 - A1C 6.5 - Continue SGLT2i - Management per PCP  Follow up in 2-3 months with Dr. Alease Amend + echo  Arlice Bene [facility] FNP-BC 08/18/23

## 2023-08-18 NOTE — Patient Instructions (Signed)
 Medication Changes:  No Changes In Medications at this time.   Lab Work:  Labs done today, your results will be available in MyChart, we will contact you for abnormal readings  Follow-Up in: 3 months with an ECHO PLEASE CALL OUR OFFICE AROUND late MAY TO GET SCHEDULED FOR YOUR APPOINTMENT. PHONE NUMBER IS [phone] OPTION 2   At the [facility], you and your health needs are our priority. We have a designated team specialized in the treatment of Heart Failure. This Care Team includes your primary Heart Failure Specialized Cardiologist (physician), Advanced Practice Providers (APPs- Physician Assistants and Nurse Practitioners), and Pharmacist who all work together to provide you with the care you need, when you need it.   You may see any of the following providers on your designated Care Team at your next follow up:  Dr. Jules Oar Dr. Peder Bourdon Dr. Alwin Baars Dr. Judyth Nunnery Nieves Bars, NP Ruddy Corral, [place] [facility] [place], [place] Dennise Fitz, NP [place] Lee, NP Luster Salters, PharmD   Please be sure to bring in all your medications bottles to every appointment.   Need to Contact Us :  If you have any questions or concerns before your next appointment please send us  a message through [place] or call our office at [phone].    TO LEAVE A MESSAGE FOR THE NURSE SELECT OPTION 2, PLEASE LEAVE A MESSAGE INCLUDING: YOUR NAME DATE OF BIRTH CALL BACK NUMBER REASON FOR CALL**this is important as we prioritize the call backs  YOU WILL RECEIVE A CALL BACK THE SAME DAY AS LONG AS YOU CALL BEFORE 4:00 PM

## 2023-08-18 NOTE — Telephone Encounter (Addendum)
 Spoke to patient regarding lab work. Made him aware of elevated glucose and needs to follow up with PCP. States he has an appointment in July. Told patient he needs to get a earlier appointment. Patient agreeable.  ----- Message from Elmarie Hacking sent at 08/18/2023  4:17 PM EDT ----- Labs stable but glucose elevated, needs to see PCP regarding glucose control

## 2023-08-19 ENCOUNTER — Other Ambulatory Visit: Payer: Self-pay | Admitting: Nurse Practitioner

## 2023-08-30 ENCOUNTER — Ambulatory Visit: Attending: Nurse Practitioner | Admitting: Nurse Practitioner

## 2023-08-30 ENCOUNTER — Encounter: Payer: Self-pay | Admitting: Nurse Practitioner

## 2023-08-30 VITALS — BP 136/96 | HR 87 | Ht 74.0 in | Wt 361.0 lb

## 2023-08-30 DIAGNOSIS — I5022 Chronic systolic (congestive) heart failure: Secondary | ICD-10-CM | POA: Insufficient documentation

## 2023-08-30 DIAGNOSIS — I10 Essential (primary) hypertension: Secondary | ICD-10-CM | POA: Insufficient documentation

## 2023-08-30 DIAGNOSIS — I4891 Unspecified atrial fibrillation: Secondary | ICD-10-CM | POA: Diagnosis present

## 2023-08-30 DIAGNOSIS — K219 Gastro-esophageal reflux disease without esophagitis: Secondary | ICD-10-CM | POA: Insufficient documentation

## 2023-08-30 DIAGNOSIS — E785 Hyperlipidemia, unspecified: Secondary | ICD-10-CM | POA: Insufficient documentation

## 2023-08-30 DIAGNOSIS — Z79899 Other long term (current) drug therapy: Secondary | ICD-10-CM | POA: Insufficient documentation

## 2023-08-30 MED ORDER — DIGOXIN 125 MCG PO TABS
125.0000 ug | ORAL_TABLET | Freq: Every day | ORAL | 1 refills | Status: AC
Start: 2023-08-30 — End: ?

## 2023-08-30 NOTE — Patient Instructions (Addendum)
 Medication Instructions:  Your physician has recommended you make the following change in your medication:  Please restart Digoxin  0.125   Labwork: In 1 week   Testing/Procedures: None   Follow-Up: Your physician recommends that you schedule a follow-up appointment in: 4-6 months  1 week Nurse visit   Any Other Special Instructions Will Be Listed Below (If Applicable).  If you need a refill on your cardiac medications before your next appointment, please call your pharmacy.

## 2023-08-30 NOTE — Progress Notes (Unsigned)
 Cardiology Office Note:  .   Date:  08/30/2023 ID:  Cole Singleton, DOB Dec 29, 1975, MRN [id number] PCP: Anton Baton, NP  [facility] Providers Cardiologist:  Hazle Lites, MD    History of Present Illness: .   Cole Singleton is a 48 y.o. male with a PMH of HFrEF/presumed NICM, hypertension, HLD, morbid obesity, OSA, who presents today for follow-up.  Last seen by Woodfin Hays, PA-C on January 28, 2022.  Was over doing well at the time, did note some intermittent dyspnea, denied any PND/orthopnea, as well as chest pain/palpitations.  It was noted he had a previous sleep study performed in July 2023 that revealed severe OSA, had not proceeded with arranging a CPAP and patient noted he was unsure if he be able to tolerate CPAP.  Was not interested in ICD placement at the time.   03/17/2023 - Today he presents for overdue follow-up.  He states he recently changed jobs and had to [place], said he ran out of medications and had a spell of heart failure symptoms that have since resolved since returning to taking his medications.  Says he could not tolerate spironolactone  as this made him feel lightheaded/"drained."  Overall he is doing well from a cardiac perspective, does admit to recent allergies/sinus issues.  He is due for lab work to be drawn, says he is looking for primary care provider.  Hospitalized in February 2025 for A-fib with RVR and acute on chronic CHF exacerbation.  Was given Lovenox , started on a Cardizem  drip, and received IV Lasix , IV Lopressor , and IV Solu-Medrol .  CXR revealed cardiomegaly with pulmonary edema.  Diltiazem  drip was stopped, will start on amiodarone  drip.  Echocardiogram in the hospital which showed EF 20%, no LV thrombus, severely reduced RV (new finding), and global hypokinesis.  Was followed by advanced heart failure team, had poor response to IV Lasix , found to have severe dyspnea during conversation, cool extremities and narrow  pulse pressure.  Found to be in A-fib with RVR.  Was started on milrinone  and Lasix  drip.  Did well with milrinone  that was eventually weaned off, started on GDMT. See TEE report noted below.   Seen by heart failure clinic on July 01, 2023.  Was doing pretty good.  Weight at home was found to be 372 pounds.  Just admitted to doing dip.  Denied any alcohol, tobacco abuse, or illicit drug abuse.  Was recommended to add beta-blocker at next visit.  07/19/2023 - Today he presents for follow-up.  Overall doing well.  He admits to some generalized cramping and wonders if this is related to his medication. Denies any chest pain, shortness of breath, palpitations, syncope, presyncope, dizziness, orthopnea, PND, swelling or significant weight changes, acute bleeding, or claudication.  Weight is overall stable per his report. His weight is down 5 lbs from November 2024.  Compliant with his medications.  08/30/2023 - Overall doing well. Weight is down from last office visit. Does admit to sensation of feeling like he is in "a haze." Says he believes this is related to his blood sugar and from his med changes.  He is out of digoxin  and requesting a refill of this medication.  Also says he has been out of Protonix  and has been taking Prilosec  over-the-counter and replace of this.  He is scheduled to see clinical pharmacist regarding Ozempic tomorrow. Denies any chest pain, shortness of breath, palpitations, syncope, presyncope, dizziness, orthopnea, PND, swelling or significant weight changes, acute bleeding, or  claudication.  ROS: Negative. See HPI.   Studies Reviewed: Aaron Aas    EKG: EKG is not ordered.  Echo 06/2023:  1. Left ventricular ejection fraction, by estimation, is <20%. The left  ventricle has severely decreased function. The left ventricle demonstrates global hypokinesis. The left ventricular internal cavity size was severely dilated. Left ventricular diastolic parameters are consistent with Grade I  diastolic dysfunction (impaired relaxation). Elevated left atrial pressure.   2. Right ventricular systolic function is normal. The right ventricular  size is normal. Tricuspid regurgitation signal is inadequate for assessing  PA pressure.   3. The mitral valve is normal in structure. No evidence of mitral valve  regurgitation. No evidence of mitral stenosis.   4. The aortic valve was not well visualized. Aortic valve regurgitation  is not visualized. No aortic stenosis is present.   5. Aortic dilatation noted. There is mild dilatation of the ascending  aorta, measuring 39 mm.   6. The inferior vena cava is normal in size with greater than 50%  respiratory variability, suggesting right atrial pressure of 3 mmHg.   Comparison(s): A prior study was performed on 06/16/2023. EF 20%.Left  ventricle moderately to severely dilated.   TEE 05/2023:  Septal shift into the RV consistent with left sided volume overload.  Left ventricular ejection fraction, by estimation, is <20%. The left  ventricle has severely decreased function. The left ventricle demonstrates  global hypokinesis. The left  ventricular internal cavity size was moderately dilated.   2. Right ventricular systolic function is moderately reduced. The right  ventricular size is mildly enlarged. There is moderately elevated  pulmonary artery systolic pressure. The estimated right ventricular  systolic pressure is 56.0 mmHg.   3. Left atrial size was moderately dilated. A left atrial/left atrial  appendage thrombus was detected. The LAA emptying velocity was 28 cm/s.   4. Right atrial size was moderately dilated.   5. Moderate secondary MR. The mitral valve is normal in structure.  Moderate mitral valve regurgitation. No evidence of mitral stenosis.   6. Tricuspid valve regurgitation is mild to moderate.   7. The aortic valve is tricuspid. Aortic valve regurgitation is not  visualized.   8. The left upper pulmonary vein is abnormal.    FINDINGS   Left Ventricle: Septal shift into the RV consistent with left sided  volume overload. Left ventricular ejection fraction, by estimation, is  Echo 10/2021:  1. Left ventricular ejection fraction, by estimation, is 20 to 25%. The  left ventricle has severely decreased function. The left ventricle  demonstrates global hypokinesis. The left ventricular internal cavity size was severely dilated. Left ventricular diastolic parameters were normal.   2. Right ventricular systolic function is normal. The right ventricular  size is normal.   3. Left atrial size was moderately dilated.   4. The mitral valve is normal in structure. Trivial mitral valve  regurgitation. No evidence of mitral stenosis.   5. The aortic valve is tricuspid. Aortic valve regurgitation is not  visualized. No aortic stenosis is present.   6. The inferior vena cava is dilated in size with >50% respiratory  variability, suggesting right atrial pressure of 8 mmHg.  Lexiscan  05/2017:  There was no ST segment deviation noted during stress. This is a high risk study. Risk is based on decreased LVEF. There is no current myocardium at jeopardy. The left ventricular ejection fraction is severely decreased (<30%). Moderate inferior defect consistent with possible prior infarct vs subdiaphragmatic attenuation. There is no current ischemia.  Physical Exam:   VS:  BP (!) 136/96   Pulse 87   Ht 6\' 2"  (1.88 m)   Wt (!) 361 lb (163.7 kg)   SpO2 98%   BMI 46.35 kg/m    Wt Readings from Last 3 Encounters:  08/30/23 (!) 361 lb (163.7 kg)  08/18/23 (!) 361 lb 9.6 oz (164 kg)  07/29/23 (!) 370 lb (167.8 kg)    GEN: Morbidly obese, 48 y.o. male in no acute distress NECK: No JVD; No carotid bruits CARDIAC: S1/S2, RRR, no murmurs, rubs, gallops RESPIRATORY:  Clear to auscultation without rales, wheezing or rhonchi  ABDOMEN: Soft, non-tender, non-distended EXTREMITIES:  No edema; No deformity   ASSESSMENT AND PLAN: .     HFrEF/chronic systolic CHF, medication management Stage C, NYHA class I-II symptoms. EF <20%  06/2023. Euvolemic and well compensated on exam.  Continue Coreg , Jardiance , Torsemide , Entresto , and Aldactone .  Will restart digoxin  and obtain EKG and check digoxin  level in 1 week.  He verbalized understanding.  Low sodium diet, fluid restriction <2L, and daily weights encouraged. Educated to contact our office for weight gain of 2 lbs overnight or 5 lbs in one week. Continue to follow-up with HF clinic as scheduled.   2. A-fib Patient chemically converted with amiodarone .  Continue amiodarone .  Was instructed by heart failure specialist on office visit on July 01, 2023 to stop amiodarone  when he runs out.  Continue Eliquis  for stroke prevention.  He is on appropriate dosage denies any bleeding issues.  3. HTN SBP mildly elevated. Discussed to monitor BP at home at least 2 hours after medications and sitting for 5-10 minutes.  Will begin and restart digoxin  as noted above.  Continue rest of medication regimen.   Heart healthy diet and regular cardiovascular exercise encouraged.   4. Morbid obesity Weight loss via diet and exercise encouraged. Discussed the impact being overweight would have on cardiovascular risk.  He has been referred to clinical Pharm.D. for GLP-1.  He denies any personal or family history of thyroid  cancer/medullary endocrine neoplasia syndrome type II.  5. GERD Currently tolerating Prilosec  well.  Instructed him to let PCP aware and okay to continue omeprazole  20 mg daily.  PCP to manage.  Continue to follow with PCP.  6. HLD LDL from March 2025 was 67.  Triglycerides 282.  Continue medication regimen. Continue follow-up with PCP.  Dispo: Follow-up with me/APP in 4-6 months or sooner if anything changes.   Signed, Lasalle Pointer, NP

## 2023-08-31 ENCOUNTER — Telehealth: Payer: Self-pay | Admitting: Pharmacist

## 2023-08-31 ENCOUNTER — Encounter: Payer: Self-pay | Admitting: Pharmacist

## 2023-08-31 ENCOUNTER — Ambulatory Visit: Attending: Cardiovascular Disease | Admitting: Pharmacist

## 2023-08-31 ENCOUNTER — Other Ambulatory Visit (HOSPITAL_COMMUNITY): Payer: Self-pay

## 2023-08-31 ENCOUNTER — Telehealth: Payer: Self-pay | Admitting: Pharmacy Technician

## 2023-08-31 VITALS — Ht 74.0 in | Wt 361.0 lb

## 2023-08-31 DIAGNOSIS — Z794 Long term (current) use of insulin: Secondary | ICD-10-CM | POA: Diagnosis present

## 2023-08-31 DIAGNOSIS — G4733 Obstructive sleep apnea (adult) (pediatric): Secondary | ICD-10-CM | POA: Diagnosis present

## 2023-08-31 DIAGNOSIS — E1122 Type 2 diabetes mellitus with diabetic chronic kidney disease: Secondary | ICD-10-CM | POA: Diagnosis present

## 2023-08-31 DIAGNOSIS — N182 Chronic kidney disease, stage 2 (mild): Secondary | ICD-10-CM | POA: Insufficient documentation

## 2023-08-31 NOTE — Patient Instructions (Addendum)
  It was nice meeting you today  The medications we discussed are called Mounjaro and Ozempic. Both are given once a month  I will complete the prior authorizations for you and contact you with result  I recommend trying to increase your physical activity as tolerated. Try to focus on vegetables, lean proteins, and whole grains  Joelene Murrain, PharmD, BCACP, CDCES, CPP [address] [place], [place], [address] Phone: [phone], Fax: [phone]

## 2023-08-31 NOTE — Telephone Encounter (Signed)
 Please complete PA for [facility]

## 2023-08-31 NOTE — Telephone Encounter (Signed)
 Pharmacy Patient Advocate Encounter   Received notification from Pt Calls Messages that prior authorization for mounjaro is required/requested.   Insurance verification completed.   The patient is insured through [facility] .   Per test claim: PA required; PA submitted to above mentioned insurance via CoverMyMeds Key/confirmation #/EOC [id number] Status is pending

## 2023-08-31 NOTE — Progress Notes (Signed)
 Patient ID: [name]                 DOB: February 29, 1976                    MRN: [id number]     HPI: Cole Singleton is a 48 y.o. male patient referred to pharmacy clinic by Dennise Fitz NP to initiate GLP1-RA therapy. Patient of Dr Maximo Spar. PMH is significant for HTN, CHF, A Fib, and obesity.   Patient presents today with wife. Limited in amount of physical activity he can perform due to SOB.   Eats two meals a day. Wife occasionally cooks but they often eat out. Limited by finances, patient currently applying for disability.   Last A1c 6.5% however wife says blood sugar has been higher since hospitalization for HF in February.  Baseline weight and BMI: 361# and 46.33    Labs: Lab Results  Component Value Date   HGBA1C 6.5 (H) 06/18/2023    Wt Readings from Last 1 Encounters:  08/30/23 (!) 361 lb (163.7 kg)    BP Readings from Last 1 Encounters:  08/30/23 (!) 136/96   Pulse Readings from Last 1 Encounters:  08/30/23 87       Component Value Date/Time   CHOL 154 07/19/2023 1550   TRIG 282 (H) 07/19/2023 1550   HDL 42 07/19/2023 1550   CHOLHDL 3.7 07/19/2023 1550   CHOLHDL 5.4 06/17/2023 0226   VLDL 20 06/17/2023 0226   LDLCALC 67 07/19/2023 1550    Past Medical History:  Diagnosis Date   Cardiomyopathy (HCC)    Cholelithiasis    Chronic HFrEF (heart failure with reduced ejection fraction) (HCC)    Diverticulosis    Hepatic steatosis    Hypertension    Morbid obesity (HCC)    OSA (obstructive sleep apnea)    Pneumonia    Pulmonary nodule     Current Outpatient Medications on File Prior to Visit  Medication Sig Dispense Refill   amiodarone  (PACERONE ) 200 MG tablet Take 1 tablet (200 mg total) by mouth daily. 90 tablet 3   apixaban  (ELIQUIS ) 5 MG TABS tablet Take 1 tablet (5 mg total) by mouth 2 (two) times daily. 180 tablet 3   carvedilol  (COREG ) 3.125 MG tablet Take 1 tablet (3.125 mg total) by mouth 2 (two) times daily. 180 tablet 3   digoxin  (LANOXIN )  0.125 MG tablet Take 1 tablet (125 mcg total) by mouth daily. 90 tablet 1   empagliflozin  (JARDIANCE ) 10 MG TABS tablet Take 1 tablet (10 mg total) by mouth daily before breakfast. 90 tablet 3   omeprazole  (PRILOSEC ) 20 MG capsule Take 20 mg by mouth daily.     potassium chloride  SA (KLOR-CON  M) 20 MEQ tablet Take 2 tablets (40 mEq total) by mouth 2 (two) times daily. 120 tablet 6   rosuvastatin  (CRESTOR ) 40 MG tablet Take 1 tablet (40 mg total) by mouth daily. 90 tablet 3   sacubitril -valsartan  (ENTRESTO ) 24-26 MG Take 1 tablet by mouth 2 (two) times daily. 60 tablet 3   spironolactone  (ALDACTONE ) 25 MG tablet Take 1 tablet (25 mg total) by mouth daily. 90 tablet 3   torsemide  (DEMADEX ) 20 MG tablet Take 3 tablets (60 mg total) by mouth 2 (two) times daily. 180 tablet 3   No current facility-administered medications on file prior to visit.    Allergies  Allergen Reactions   Codeine Other (See Comments)    Severe headache Pt reports being able to take percocet &  vicodin     Assessment/Plan:  1. T2DM - Patient's last A1c 6.5% which is at goal of <7.0% however BMI is 46.33 placing him in Class III severe obesity category. Recommend starting GLP1a therapy.  Using tirzepatide and semaglutide demo pens, educated on mechanism of action, storage, site selection, and administration. Advised patient on common side effects including nausea, diarrhea, dyspepsia, decreased appetite, and fatigue. Counseled patient on reducing meal size and how to titrate medication to minimize side effects. Counseled patient to call if intolerable side effects or if experiencing dehydration, abdominal pain, or dizziness. Patient will adhere to dietary modifications and will target at least 150 minutes of moderate intensity exercise weekly.   Will complete PA and contact patient with result  Chris Armstrong Creasy, PharmD, BCACP, CDCES, CPP [address] [place], [place], [address] Phone: [phone], Fax:  [phone]

## 2023-09-01 ENCOUNTER — Other Ambulatory Visit (HOSPITAL_COMMUNITY): Payer: Self-pay

## 2023-09-01 ENCOUNTER — Telehealth: Payer: Self-pay | Admitting: Pharmacy Technician

## 2023-09-01 DIAGNOSIS — E1122 Type 2 diabetes mellitus with diabetic chronic kidney disease: Secondary | ICD-10-CM

## 2023-09-01 NOTE — Telephone Encounter (Signed)
 Pharmacy Patient Advocate Encounter  Received notification from [facility] community plan that Prior Authorization for Mounjaro has been DENIED.  Full denial letter will be uploaded to the media tab. See denial reason below.   PA #/Case ID/Reference #: [id number]

## 2023-09-01 NOTE — Telephone Encounter (Signed)
 Pharmacy Patient Advocate Encounter   Received notification from Pt Calls Messages that prior authorization for ozempic is required/requested.   Insurance verification completed.   The patient is insured through [employer] .   Per test claim: PA required; PA submitted to above mentioned insurance via CoverMyMeds Key/confirmation #/EOC [id number] Status is pending

## 2023-09-01 NOTE — Telephone Encounter (Signed)
 Pharmacy Patient Advocate Encounter  Received notification from [employer] community plan that Prior Authorization for ozempic has been APPROVED from 09/01/23 to 08/31/24. Ran test claim, Copay is $4.00- one month. This test claim was processed through [facility]- copay amounts may vary at other pharmacies due to pharmacy/plan contracts, or as the patient moves through the different stages of their insurance plan.   PA #/Case ID/Reference #: [id number]

## 2023-09-02 MED ORDER — OZEMPIC (2 MG/DOSE) 8 MG/3ML ~~LOC~~ SOPN: PEN_INJECTOR | SUBCUTANEOUS | 1 refills | Status: DC

## 2023-09-02 NOTE — Telephone Encounter (Signed)
 Called and spoke with patient.

## 2023-09-02 NOTE — Addendum Note (Signed)
 Addended by: Sunny English on: 09/02/2023 01:38 PM   Modules accepted: Orders

## 2023-09-06 ENCOUNTER — Ambulatory Visit

## 2023-09-06 ENCOUNTER — Encounter: Payer: Self-pay | Admitting: Pharmacist

## 2023-09-06 ENCOUNTER — Other Ambulatory Visit (HOSPITAL_COMMUNITY)
Admission: RE | Admit: 2023-09-06 | Discharge: 2023-09-06 | Disposition: A | Discharge status: Home / Self Care | Source: Ambulatory Visit | Attending: Nurse Practitioner | Admitting: Nurse Practitioner

## 2023-09-06 DIAGNOSIS — I5022 Chronic systolic (congestive) heart failure: Secondary | ICD-10-CM | POA: Insufficient documentation

## 2023-09-06 DIAGNOSIS — Z79899 Other long term (current) drug therapy: Secondary | ICD-10-CM | POA: Diagnosis present

## 2023-09-06 DIAGNOSIS — I4891 Unspecified atrial fibrillation: Secondary | ICD-10-CM | POA: Insufficient documentation

## 2023-09-06 LAB — DIGOXIN LEVEL: Digoxin Level: 0.8 ng/mL (ref 0.8–2.0)

## 2023-09-06 MED ORDER — SEMAGLUTIDE(0.25 OR 0.5MG/DOS) 2 MG/3ML ~~LOC~~ SOPN: PEN_INJECTOR | SUBCUTANEOUS | 1 refills | Status: DC

## 2023-09-06 NOTE — Progress Notes (Signed)
 Patient here for EKG routed over to provider

## 2023-09-07 NOTE — Progress Notes (Signed)
 EKG reviewed that shows NSR with some nonspecific ST/T wave abnormality. Continue current treatment plan.   Thanks!   Best, Lasalle Pointer, NP

## 2023-09-08 ENCOUNTER — Telehealth: Payer: Self-pay

## 2023-09-08 NOTE — Telephone Encounter (Signed)
-----   Message from Lasalle Pointer sent at 09/07/2023  3:49 PM EDT -----  ----- Message ----- From: Camilo Cella, CMA Sent: 09/06/2023   1:29 PM EDT To: Lasalle Pointer, NP

## 2023-09-08 NOTE — Telephone Encounter (Signed)
 Per Nellie Banas:  EKG reviewed that shows NSR with some nonspecific ST/T wave abnormality. Continue current treatment plan.    Thanks!    Best, Lasalle Pointer, NP  Patient informed and verbalized understanding

## 2023-09-10 ENCOUNTER — Ambulatory Visit: Payer: Self-pay | Admitting: Nurse Practitioner

## 2023-10-08 ENCOUNTER — Encounter: Payer: Self-pay | Admitting: Pharmacist

## 2023-10-08 DIAGNOSIS — E1122 Type 2 diabetes mellitus with diabetic chronic kidney disease: Secondary | ICD-10-CM

## 2023-10-08 DIAGNOSIS — Z794 Long term (current) use of insulin: Secondary | ICD-10-CM

## 2023-10-08 MED ORDER — OZEMPIC (0.25 OR 0.5 MG/DOSE) 2 MG/3ML ~~LOC~~ SOPN: 0.5000 mg | PEN_INJECTOR | SUBCUTANEOUS | 0 refills | Status: DC

## 2023-10-19 ENCOUNTER — Ambulatory Visit: Admitting: Nurse Practitioner

## 2023-11-08 ENCOUNTER — Other Ambulatory Visit: Payer: Self-pay | Admitting: Internal Medicine

## 2023-11-08 DIAGNOSIS — E1122 Type 2 diabetes mellitus with diabetic chronic kidney disease: Secondary | ICD-10-CM

## 2023-11-09 MED ORDER — OZEMPIC (0.25 OR 0.5 MG/DOSE) 2 MG/3ML ~~LOC~~ SOPN
0.5000 mg | PEN_INJECTOR | SUBCUTANEOUS | 0 refills | Status: DC
Start: 2023-11-09 — End: 2023-12-10

## 2023-11-09 NOTE — Addendum Note (Signed)
 Addended by: DARRELL BRUCKNER on: 11/09/2023 02:13 PM   Modules accepted: Orders

## 2023-12-01 ENCOUNTER — Ambulatory Visit (HOSPITAL_COMMUNITY): Payer: Self-pay | Admitting: Family Medicine

## 2023-12-01 ENCOUNTER — Ambulatory Visit: Attending: Family Medicine

## 2023-12-01 DIAGNOSIS — I5022 Chronic systolic (congestive) heart failure: Secondary | ICD-10-CM | POA: Diagnosis present

## 2023-12-01 LAB — ECHOCARDIOGRAM COMPLETE
AR max vel: 2.6 cm2
AV Area VTI: 2.79 cm2
AV Area mean vel: 2.88 cm2
AV Mean grad: 5 mmHg
AV Peak grad: 9.2 mmHg
Ao pk vel: 1.52 m/s
Area-P 1/2: 5.5 cm2
Calc EF: 38.7 %
MV VTI: 3.8 cm2
S' Lateral: 5.6 cm
Single Plane A2C EF: 41.4 %
Single Plane A4C EF: 36 %

## 2023-12-01 MED ORDER — PERFLUTREN LIPID MICROSPHERE
1.0000 mL | INTRAVENOUS | Status: AC | PRN
  Administered 2023-12-01 (×2): 10 mL via INTRAVENOUS

## 2023-12-03 ENCOUNTER — Other Ambulatory Visit: Payer: Self-pay | Admitting: Internal Medicine

## 2023-12-03 DIAGNOSIS — E1122 Type 2 diabetes mellitus with diabetic chronic kidney disease: Secondary | ICD-10-CM

## 2023-12-03 NOTE — Telephone Encounter (Addendum)
 Patient aware of echocardiogram results and appointment made for him with Dr. Zenaida.  ----- Message from Harlene CHRISTELLA Gainer sent at 12/01/2023  4:49 PM EDT ----- EF mildly improved, now 30-35%, RV moderately reduced.   Please arrange for follow up with Dr. Zenaida to discuss next steps  ----- Message ----- From: Interface, Three One Seven Sent: 12/01/2023   4:13 PM EDT To: Harlene CHRISTELLA Gainer, FNP

## 2023-12-10 ENCOUNTER — Other Ambulatory Visit (HOSPITAL_COMMUNITY): Payer: Self-pay | Admitting: Family Medicine

## 2023-12-14 ENCOUNTER — Ambulatory Visit (HOSPITAL_COMMUNITY)
Admission: RE | Admit: 2023-12-14 | Discharge: 2023-12-14 | Disposition: A | Discharge status: Home / Self Care | Source: Ambulatory Visit | Attending: Cardiology | Admitting: Cardiology

## 2023-12-14 ENCOUNTER — Encounter (HOSPITAL_COMMUNITY): Payer: Self-pay | Admitting: Cardiology

## 2023-12-14 VITALS — BP 130/88 | HR 90 | Wt 332.8 lb

## 2023-12-14 DIAGNOSIS — I5022 Chronic systolic (congestive) heart failure: Secondary | ICD-10-CM | POA: Diagnosis present

## 2023-12-14 LAB — COMPREHENSIVE METABOLIC PANEL WITH GFR
ALT: 77 U/L — ABNORMAL HIGH (ref 0–44)
AST: 55 U/L — ABNORMAL HIGH (ref 15–41)
Albumin: 4.2 g/dL (ref 3.5–5.0)
Alkaline Phosphatase: 94 U/L (ref 38–126)
Anion gap: 15 (ref 5–15)
BUN: 12 mg/dL (ref 6–20)
CO2: 22 mmol/L (ref 22–32)
Calcium: 9.8 mg/dL (ref 8.9–10.3)
Chloride: 94 mmol/L — ABNORMAL LOW (ref 98–111)
Creatinine, Ser: 1.4 mg/dL — ABNORMAL HIGH (ref 0.61–1.24)
GFR, Estimated: >60 mL/min (ref >60)
Glucose, Bld: 352 mg/dL — ABNORMAL HIGH (ref 70–99)
Potassium: 4.6 mmol/L (ref 3.5–5.1)
Sodium: 131 mmol/L — ABNORMAL LOW (ref 135–145)
Total Bilirubin: 0.9 mg/dL (ref 0.0–1.2)
Total Protein: 7.9 g/dL (ref 6.5–8.1)

## 2023-12-14 LAB — TSH: TSH: 2.947 u[IU]/mL (ref 0.350–4.500)

## 2023-12-14 LAB — HEMOGLOBIN A1C
Hgb A1c MFr Bld: 10.9 % — ABNORMAL HIGH (ref 4.8–5.6)
Mean Plasma Glucose: 266.13 mg/dL

## 2023-12-14 LAB — T4, FREE: Free T4: 1.12 ng/dL (ref 0.61–1.12)

## 2023-12-14 LAB — BRAIN NATRIURETIC PEPTIDE: B Natriuretic Peptide: 16.1 pg/mL (ref 0.0–100.0)

## 2023-12-14 MED ORDER — TORSEMIDE 20 MG PO TABS: 40.0000 mg | ORAL_TABLET | Freq: Every day | ORAL | 3 refills | Status: AC

## 2023-12-14 MED ORDER — SACUBITRIL-VALSARTAN 49-51 MG PO TABS: 1.0000 | ORAL_TABLET | Freq: Two times a day (BID) | ORAL | 11 refills | Status: DC

## 2023-12-14 MED ORDER — POTASSIUM CHLORIDE CRYS ER 20 MEQ PO TBCR: 20.0000 meq | EXTENDED_RELEASE_TABLET | Freq: Every day | ORAL | 11 refills | Status: AC

## 2023-12-14 NOTE — Patient Instructions (Addendum)
 DECREASE Torsemide  to 40 mg daily.  DECREASE Potassium to 20 mEq ( 1 tab) daily.  INCREASE Entresto  to 49/51 mg Twice daily  Labs done today, your results will be available in MyChart, we will contact you for abnormal readings.  Your physician recommends that you schedule a follow-up appointment in: 2 months ( October) ** PLEASE CALL THE OFFICE IN SEPTEMBER TO ARRANGE YOUR FOLLOW UP APPOINTMENT.**  If you have any questions or concerns before your next appointment please send us  a message through [place] or call our office at [phone].    TO LEAVE A MESSAGE FOR THE NURSE SELECT OPTION 2, PLEASE LEAVE A MESSAGE INCLUDING: YOUR NAME DATE OF BIRTH CALL BACK NUMBER REASON FOR CALL**this is important as we prioritize the call backs  YOU WILL RECEIVE A CALL BACK THE SAME DAY AS LONG AS YOU CALL BEFORE 4:00 PM  At the [facility], you and your health needs are our priority. As part of our continuing mission to provide you with exceptional heart care, we have created designated Provider Care Teams. These Care Teams include your primary Cardiologist (physician) and Advanced Practice Providers (APPs- Physician Assistants and Nurse Practitioners) who all work together to provide you with the care you need, when you need it.   You may see any of the following providers on your designated Care Team at your next follow up: Dr Toribio Fuel Dr Ezra Shuck Dr. Ria Commander Dr. Morene Brownie Amy Lenetta, NP Caffie Shed, [place] [facility] [place], [place] Beckey Coe, NP [place] Lee, NP Ellouise Class, NP Tinnie Redman, PharmD Jaun Bash, PharmD   Please be sure to bring in all your medications bottles to every appointment.    Thank you for choosing [facility]

## 2023-12-15 ENCOUNTER — Ambulatory Visit (HOSPITAL_COMMUNITY): Payer: Self-pay | Admitting: Cardiology

## 2023-12-15 LAB — T3, FREE: T3, Free: 3 pg/mL (ref 2.0–4.4)

## 2023-12-17 ENCOUNTER — Ambulatory Visit: Payer: Self-pay

## 2023-12-17 NOTE — Telephone Encounter (Signed)
 Called CAL and spoke with Ashley---Patient and patient's wife were warm transferred to her for further assistance with scheduling in the PCP office and advice on addressing his blood sugar level right now.  FYI Only or Action Required?: FYI only for provider.  Patient was last seen in primary care on 07/19/2023 by Deitra Morton Sebastian Nena, NP.  Called Nurse Triage reporting Hyperglycemia.  Symptoms began blood sugar has been elevated for an unknown amount of time.  Interventions attempted: Other: medication prescribed for blood sugar.  Symptoms are: unchanged.  Triage Disposition: Call PCP Now  Patient/caregiver understands and will follow disposition?: Yes---Patient and patient's wife transferred over to [facility] at the [facility] for further assistance with scheduling and advice on current blood sugar                Copied from CRM #[id number]. Topic: Clinical - Red Word Triage >> Dec 17, 2023  3:27 PM Rachelle R wrote: Red Word that prompted transfer to Nurse Triage: Patient got lab results done at the hospital and blood sugar was over 300. Was advised needed to be seen by his PCP as soon as possible.  Established with NP [facility], but does not want to be seen by her. Wants to [facility] to another physician. Reason for Disposition  [1] Blood glucose > 300 mg/dL (83.2 mmol/L) AND [7] two or more times in a row  Answer Assessment - Initial Assessment Questions Patient had a lot of medication changes since February Breathing has improved Weight loss going well Patient just feels tired  344 blood sugar at 3:42 PM on 12/17/2023 during triage with this RN  Called CAL and spoke with Ashley---Patient and patient's wife were warm transferred to her for further assistance with scheduling in the PCP office and advice on addressing his blood sugar level right now.    1. BLOOD GLUCOSE: What is your blood glucose level?      At doctor's office three days ago 352 2. ONSET: When did  you check the blood glucose?     At doctors office three days ago 3. USUAL RANGE: What is your glucose level usually? (e.g., usual fasting morning value, usual evening value)     ----- 4. KETONES: Do you check for ketones (urine or blood test strips)? If Yes, ask: What does the test show now?      ---- 5. TYPE 1 or 2:  Do you know what type of diabetes you have?  (e.g., Type 1, Type 2, Gestational; doesn't know)      ---- 6. INSULIN : Do you take insulin ? What type of insulin (s) do you use? What is the mode of delivery? (syringe, pen; injection or pump)?      Ozempic  7. DIABETES PILLS: Do you take any pills for your diabetes? If Yes, ask: Have you missed taking any pills recently?     Jardiance  8. OTHER SYMPTOMS: Do you have any symptoms? (e.g., fever, frequent urination, difficulty breathing, dizziness, weakness, vomiting)     Patient has some nausea but it's not clear if it is from his blood sugar being elevated or if it is from all these medication changes  Protocols used: Diabetes - High Blood Sugar-A-AH

## 2023-12-17 NOTE — Telephone Encounter (Signed)
 Patient is going to urgent care and then following up with dod when the office opens back up Tuesday if needed.

## 2023-12-19 NOTE — Progress Notes (Signed)
   ADVANCED HEART FAILURE FOLLOW UP CLINIC NOTE  Referring Physician: Deitra Morton Hummer, Thurmon*  Primary Care: Deitra Morton Hummer, Nena, NP Primary Cardiologist:  HPI: Cole Singleton is a 48 y.o. male who presents for follow up of chronic systolic heart faliure.      Established care with [facility] in 2016. EF 20-25%. Lost to follow up until 2019. Nuclear stress in 2019 showed mod inferior defect consistent with prior infact. Has continued to follow with intermittent lapses in medication adherence.       2/25 he developed viral URI and was treated with Augmentin, however symptoms worsened and he developed palpitations and abdominal bloating. He then took PRN Lasix  without increase in UOP and developed progressive shortness of breath.    Admitted 2/25 with AF with RVR. Decompensated after dilt drip was started, required IV milrinone  and aggressive diuresis. TEE/DCCV showed LVEF <20%, RV moderately down and + thrombus, so no DCCV. Chemically converted to NSR. Cath deferred given conversion. Drips weaned and GDMT titrated. He was discharged home, weight 362 lbs.     SUBJECTIVE:  Has lost a significant amount of weight recently. He does report overall being fatigued, though denies any significant orthopnea or PND, his wife notes that his snoring has been much better since discharge. He is continually thirsty and has been drinking a lot more recently, though still urinating. He denies any palpitations.   PMH, current medications, allergies, social history, and family history reviewed in epic.  PHYSICAL EXAM: Vitals:   12/14/23 1348  BP: 130/88  Pulse: 90  SpO2: 96%   GENERAL: Well nourished and in no apparent distress at rest, obese PULM:  Normal work of breathing, clear to auscultation bilaterally. Respirations are unlabored.  CARDIAC:  JVP: flat         Normal rate with regular rhythm. No murmurs, rubs or gallops.  Trace edema. Warm and well perfused extremities. ABDOMEN:  Soft, non-tender, non-distended. NEUROLOGIC: Patient is oriented x3 with no focal or lateralizing neurologic deficits.    DATA REVIEW  ECG: 09/06/2023: NSR, cannot rule out anterior infarct   12/01/2023: LVEF 30-35%, moderately to severely dilated, RV function moderately reduced  ECHO: 10/2021: LVEF 25-30%, global hypokinesis   CATH: None   ASSESSMENT & PLAN:  Chronic systolic heart failure: Patient with long standing cardiomyopathy, recent decompensation due to atrial fibrillation. No ischemic evaluation, EF still moderately reduced but BP not optimized. - Increase entresto  to 49/51mg  BID - Continue digoxin  0.125mg  daily - Continue carvedilol  3.125mg  BID - Continue spironolactone  25mg  daily - Continue jardiance  10mg  daily - Decrease torsemide  to 40mg  daily - Decrease Kcl to 20mEq daily - Continue to maximize GDMT, if still reduced can consider ischemic evaluation and ICD  Diabetes: Symptoms concerning for progression of disease including increased thirst, fatigue. - Decrease diuretics, obtain A1c - Follow up urgently with PCP  Atrial fibrillation: Currently in NSR. - Continue amiodarone  200mg  daily - Labs at next visit  Obesity: Has lost over 30lbs since discharge. - Continue GLP-1  HLD:  - Continue crestor   Follow up in 2-3 months  Morene Brownie, MD Advanced Heart Failure Mechanical Circulatory Support 12/19/23

## 2023-12-21 ENCOUNTER — Ambulatory Visit (INDEPENDENT_AMBULATORY_CARE_PROVIDER_SITE_OTHER): Admitting: Family Medicine

## 2023-12-21 VITALS — BP 121/89 | HR 81 | Temp 97.5°F | Ht 74.0 in | Wt 335.4 lb

## 2023-12-21 DIAGNOSIS — E1169 Type 2 diabetes mellitus with other specified complication: Secondary | ICD-10-CM | POA: Diagnosis not present

## 2023-12-21 DIAGNOSIS — K219 Gastro-esophageal reflux disease without esophagitis: Secondary | ICD-10-CM | POA: Diagnosis not present

## 2023-12-21 DIAGNOSIS — E119 Type 2 diabetes mellitus without complications: Secondary | ICD-10-CM | POA: Insufficient documentation

## 2023-12-21 DIAGNOSIS — I5022 Chronic systolic (congestive) heart failure: Secondary | ICD-10-CM | POA: Diagnosis not present

## 2023-12-21 DIAGNOSIS — Z7984 Long term (current) use of oral hypoglycemic drugs: Secondary | ICD-10-CM | POA: Diagnosis not present

## 2023-12-21 DIAGNOSIS — Z7985 Long-term (current) use of injectable non-insulin antidiabetic drugs: Secondary | ICD-10-CM

## 2023-12-21 MED ORDER — SEMAGLUTIDE (2 MG/DOSE) 8 MG/3ML ~~LOC~~ SOPN: 2.0000 mg | PEN_INJECTOR | SUBCUTANEOUS | 3 refills | Status: DC

## 2023-12-21 MED ORDER — OMEPRAZOLE 20 MG PO CPDR
20.0000 mg | DELAYED_RELEASE_CAPSULE | Freq: Two times a day (BID) | ORAL | 1 refills | Status: AC
Start: 2023-12-21 — End: ?

## 2023-12-21 MED ORDER — EMPAGLIFLOZIN 25 MG PO TABS: 25.0000 mg | ORAL_TABLET | Freq: Every day | ORAL | 1 refills | Status: AC

## 2023-12-21 NOTE — Patient Instructions (Addendum)
 Tips for success with GLP-1 Mediations (and by success, how not to be super sick on your stomach): Eat small meals AVOID heavy foods (fried/ high in carbs like bread, pasta, rice) AVOID carbonated beverages (soda/ beer, as these can increase bloating) DOUBLE your water intake (will help you avoid constipation/ dehydration)   GLP-1 Medications CAN cause: Nausea Abdominal pain Increased acid reflux (sometimes presents as sour burps) Constipation OR Diarrhea Fatigue (especially when you first start it) Continue to monitor your blood sugars as we discussed and record them. Bring the log to your next appointment.  Take your medications as directed.    Goal Blood glucose:    Fasting (before meals) = 80 to 130   Within 2 hours of eating = less than 180   Understanding your Hemoglobin A1c: 10.9     Diabetes Mellitus and Nutrition    I think that you would greatly benefit from seeing a nutritionist. If this is something you are interested in, please call Dr Wonda at [phone] to schedule an appointment.   When you have diabetes (diabetes mellitus), it is very important to have healthy eating habits because your blood sugar (glucose) levels are greatly affected by what you eat and drink. Eating healthy foods in the appropriate amounts, at about the same times every day, can help you: Control your blood glucose. Lower your risk of heart disease. Improve your blood pressure. Reach or maintain a healthy weight.  Every person with diabetes is different, and each person has different needs for a meal plan. Your health care provider may recommend that you work with a diet and nutrition specialist (dietitian) to make a meal plan that is best for you. Your meal plan may vary depending on factors such as: The calories you need. The medicines you take. Your weight. Your blood glucose, blood pressure, and cholesterol levels. Your activity level. Other health conditions you have, such as  heart or kidney disease.  How do carbohydrates affect me? Carbohydrates affect your blood glucose level more than any other type of food. Eating carbohydrates naturally increases the amount of glucose in your blood. Carbohydrate counting is a method for keeping track of how many carbohydrates you eat. Counting carbohydrates is important to keep your blood glucose at a healthy level, especially if you use insulin  or take certain oral diabetes medicines. It is important to know how many carbohydrates you can safely have in each meal. This is different for every person. Your dietitian can help you calculate how many carbohydrates you should have at each meal and for snack. Foods that contain carbohydrates include: Bread, cereal, rice, pasta, and crackers. Potatoes and corn. Peas, beans, and lentils. Milk and yogurt. Fruit and juice. Desserts, such as cakes, cookies, ice cream, and candy.  How does alcohol affect me? Alcohol can cause a sudden decrease in blood glucose (hypoglycemia), especially if you use insulin  or take certain oral diabetes medicines. Hypoglycemia can be a life-threatening condition. Symptoms of hypoglycemia (sleepiness, dizziness, and confusion) are similar to symptoms of having too much alcohol. If your health care provider says that alcohol is safe for you, follow these guidelines: Limit alcohol intake to no more than 1 drink per day for nonpregnant women and 2 drinks per day for men. One drink equals 12 oz of beer, 5 oz of wine, or 1 oz of hard liquor. Do not drink on an empty stomach. Keep yourself hydrated with water, diet soda, or unsweetened iced tea. Keep in mind that regular soda, juice,  and other mixers may contain a lot of sugar and must be counted as carbohydrates.  What are tips for following this plan?  Reading food labels Start by checking the serving size on the label. The amount of calories, carbohydrates, fats, and other nutrients listed on the label are  based on one serving of the food. Many foods contain more than one serving per package. Check the total grams (g) of carbohydrates in one serving. You can calculate the number of servings of carbohydrates in one serving by dividing the total carbohydrates by 15. For example, if a food has 30 g of total carbohydrates, it would be equal to 2 servings of carbohydrates. Check the number of grams (g) of saturated and trans fats in one serving. Choose foods that have low or no amount of these fats. Check the number of milligrams (mg) of sodium in one serving. Most people should limit total sodium intake to less than 2,300 mg per day. Always check the nutrition information of foods labeled as low-fat or nonfat. These foods may be higher in added sugar or refined carbohydrates and should be avoided. Talk to your dietitian to identify your daily goals for nutrients listed on the label.  Shopping Avoid buying canned, premade, or processed foods. These foods tend to be high in fat, sodium, and added sugar. Shop around the outside edge of the grocery store. This includes fresh fruits and vegetables, bulk grains, fresh meats, and fresh dairy.  Cooking Use low-heat cooking methods, such as baking, instead of high-heat cooking methods like deep frying. Cook using healthy oils, such as olive, canola, or sunflower oil. Avoid cooking with butter, cream, or high-fat meats.  Meal planning Eat meals and snacks regularly, preferably at the same times every day. Avoid going long periods of time without eating. Eat foods high in fiber, such as fresh fruits, vegetables, beans, and whole grains. Talk to your dietitian about how many servings of carbohydrates you can eat at each meal. Eat 4-6 ounces of lean protein each day, such as lean meat, chicken, fish, eggs, or tofu. 1 ounce is equal to 1 ounce of meat, chicken, or fish, 1 egg, or 1/4 cup of tofu. Eat some foods each day that contain healthy fats, such as  avocado, nuts, seeds, and fish.  Lifestyle  Check your blood glucose regularly. Exercise at least 30 minutes 5 or more days each week, or as told by your health care provider. Take medicines as told by your health care provider. Do not use any products that contain nicotine or tobacco, such as cigarettes and e-cigarettes. If you need help quitting, ask your health care provider. Work with a Veterinary surgeon or diabetes educator to identify strategies to manage stress and any emotional and social challenges.  What are some questions to ask my health care provider? Do I need to meet with a diabetes educator? Do I need to meet with a dietitian? What number can I call if I have questions? When are the best times to check my blood glucose?  Where to find more information: American Diabetes Association: diabetes.org/food-and-fitness/food Academy of Nutrition and Dietetics: https://www.vargas.com/ [employer] of Diabetes and Digestive and Kidney Diseases (NIH): Find[employer]el[employer].cz  Summary A healthy meal plan will help you control your blood glucose and maintain a healthy lifestyle. Working with a diet and nutrition specialist (dietitian) can help you make a meal plan that is best for you. Keep in mind that carbohydrates and alcohol have immediate effects on your blood glucose levels.  It is important to count carbohydrates and to use alcohol carefully. This information is not intended to replace advice given to you by your health care provider. Make sure you discuss any questions you have with your health care provider. Document Released: 01/01/2005 Document Revised: 05/11/2016 Document Reviewed: 05/11/2016 Elsevier Interactive Patient Education  [employer].

## 2023-12-21 NOTE — Progress Notes (Signed)
 Subjective:  Patient ID: Cole Singleton, male    DOB: 1975-08-13, 48 y.o.   MRN: [id number]  Patient Care Team: Deitra Morton Sebastian Nena, NP as PCP - General (Nurse Practitioner) Mona Vinie BROCKS, MD as PCP - Cardiology (Cardiology)   Chief Complaint:  elevated BS (Since he was in the hospital in Feb )   HPI: Cole Singleton is a 48 y.o. male presenting on 12/21/2023 for elevated BS (Since he was in the hospital in Feb )     [name] is a 48 year old male with type 2 diabetes who presents with elevated blood sugar levels.  He has been experiencing elevated blood sugar levels, with recent home readings of 350 and 240 mg/dL upon waking without food intake. He has not been consistently checking his blood sugar at home until recently. His hemoglobin A1c was measured at 10.9% seven days ago. He has been on Ozempic  (semaglutide ) 1 mg weekly for 15 weeks and Jardiance  (empagliflozin ) 10 mg daily for over a year. He experiences nausea and lack of energy as side effects from Ozempic , particularly on Tuesdays and Wednesdays, which affects his appetite.  He has a history of heart failure and is on Entresto  (sacubitril /valsartan ) for this condition. He has been seeing doctors for heart-related issues for about 15 years. He has lost approximately 60 pounds recently.  His medication regimen includes spironolactone , which he was initially intolerant to but later managed to take without issues. He is also on omeprazole  for acid reflux, which he takes once daily, but may need to increase to twice daily due to worsening symptoms. He has been on a stable regimen for heart failure, including digoxin  and Coreg  (carvedilol ).  Experiences a sensation of fullness and acid reflux.          Relevant past medical, surgical, family, and social history reviewed and updated as indicated.  Allergies and medications reviewed and updated. Data reviewed: Chart in Epic.   Past Medical History:   Diagnosis Date   Cardiomyopathy (HCC)    Cholelithiasis    Chronic HFrEF (heart failure with reduced ejection fraction) (HCC)    Diverticulosis    Hepatic steatosis    Hypertension    Morbid obesity (HCC)    OSA (obstructive sleep apnea)    Pneumonia    Pulmonary nodule     Past Surgical History:  Procedure Laterality Date   NO PAST SURGERIES     TRANSESOPHAGEAL ECHOCARDIOGRAM (CATH LAB) N/A 06/18/2023   Procedure: TRANSESOPHAGEAL ECHOCARDIOGRAM;  Surgeon: Zenaida Morene PARAS, MD;  Location: [facility] INVASIVE CV LAB;  Service: Cardiovascular;  Laterality: N/A;    Social History   Socioeconomic History   Marital status: Married    Spouse name: Not on file   Number of children: Not on file   Years of education: Not on file   Highest education level: 12th grade  Occupational History   Not on file  Tobacco Use   Smoking status: Never   Smokeless tobacco: Current    Types: Snuff  Vaping Use   Vaping status: Never Used  Substance and Sexual Activity   Alcohol use: Yes    Comment: occas - 12 beers/yr   Drug use: No   Sexual activity: Not on file  Other Topics Concern   Not on file  Social History Narrative   Not on file   Social Drivers of Health   Financial Resource Strain: Low Risk  (12/21/2023)   Overall Financial Resource Strain (CARDIA)  Difficulty of Paying Living Expenses: Not hard at all  Food Insecurity: No Food Insecurity (12/21/2023)   Hunger Vital Sign    Worried About Running Out of Food in the Last Year: Never true    Ran Out of Food in the Last Year: Never true  Transportation Needs: No Transportation Needs (12/21/2023)   PRAPARE - Administrator, Civil Service (Medical): No    Lack of Transportation (Non-Medical): No  Physical Activity: Unknown (12/21/2023)   Exercise Vital Sign    Days of Exercise per Week: Patient declined    Minutes of Exercise per Session: Not on file  Stress: No Stress Concern Present (12/21/2023)   [employer] of  Occupational Health - Occupational Stress Questionnaire    Feeling of Stress: Only a little  Social Connections: Moderately Isolated (12/21/2023)   Social Connection and Isolation Panel    Frequency of Communication with Friends and Family: Three times a week    Frequency of Social Gatherings with Friends and Family: Once a week    Attends Religious Services: Patient declined    Database administrator or Organizations: No    Attends Engineer, structural: Not on file    Marital Status: Married  Catering manager Violence: Not At Risk (06/16/2023)   Humiliation, Afraid, Rape, and Kick questionnaire    Fear of Current or Ex-Partner: No    Emotionally Abused: No    Physically Abused: No    Sexually Abused: No    Outpatient Encounter Medications as of 12/21/2023  Medication Sig   amiodarone  (PACERONE ) 200 MG tablet Take 1 tablet (200 mg total) by mouth daily.   apixaban  (ELIQUIS ) 5 MG TABS tablet Take 1 tablet (5 mg total) by mouth 2 (two) times daily.   carvedilol  (COREG ) 3.125 MG tablet Take 1 tablet (3.125 mg total) by mouth 2 (two) times daily.   digoxin  (LANOXIN ) 0.125 MG tablet Take 1 tablet (125 mcg total) by mouth daily.   empagliflozin  (JARDIANCE ) 25 MG TABS tablet Take 1 tablet (25 mg total) by mouth daily before breakfast.   potassium chloride  SA (KLOR-CON  M) 20 MEQ tablet Take 1 tablet (20 mEq total) by mouth daily.   rosuvastatin  (CRESTOR ) 40 MG tablet Take 1 tablet (40 mg total) by mouth daily.   sacubitril -valsartan  (ENTRESTO ) 49-51 MG Take 1 tablet by mouth 2 (two) times daily.   Semaglutide , 2 MG/DOSE, 8 MG/3ML SOPN Inject 2 mg as directed once a week.   spironolactone  (ALDACTONE ) 25 MG tablet Take 1 tablet (25 mg total) by mouth daily.   torsemide  (DEMADEX ) 20 MG tablet Take 2 tablets (40 mg total) by mouth daily.   [DISCONTINUED] empagliflozin  (JARDIANCE ) 10 MG TABS tablet Take 1 tablet (10 mg total) by mouth daily before breakfast.   [DISCONTINUED] omeprazole   (PRILOSEC ) 20 MG capsule Take 20 mg by mouth daily.   [DISCONTINUED] Semaglutide , 1 MG/DOSE, 4 MG/3ML SOPN Inject 1 mg into the skin once a week.   omeprazole  (PRILOSEC ) 20 MG capsule Take 1 capsule (20 mg total) by mouth 2 (two) times daily before a meal.   No facility-administered encounter medications on file as of 12/21/2023.    Allergies  Allergen Reactions   Codeine Other (See Comments)    Severe headache Pt reports being able to take percocet & vicodin    Pertinent ROS per HPI, otherwise unremarkable      Objective:  BP 121/89   Pulse 81   Temp (!) 97.5 F (36.4 C)  Ht 6' 2 (1.88 m)   Wt (!) 335 lb 6.4 oz (152.1 kg)   SpO2 92%   BMI 43.06 kg/m    Wt Readings from Last 3 Encounters:  12/21/23 (!) 335 lb 6.4 oz (152.1 kg)  12/14/23 (!) 332 lb 12.8 oz (151 kg)  08/31/23 (!) 361 lb (163.7 kg)    Physical Exam Vitals and nursing note reviewed.  Constitutional:      General: He is not in acute distress.    Appearance: Normal appearance. He is morbidly obese. He is not ill-appearing, toxic-appearing or diaphoretic.  HENT:     Head: Normocephalic and atraumatic.     Nose: Nose normal.     Mouth/Throat:     Mouth: Mucous membranes are moist.  Eyes:     Conjunctiva/sclera: Conjunctivae normal.     Pupils: Pupils are equal, round, and reactive to light.  Cardiovascular:     Rate and Rhythm: Normal rate and regular rhythm.     Pulses: Normal pulses.     Heart sounds: Normal heart sounds.  Pulmonary:     Effort: Pulmonary effort is normal.     Breath sounds: Normal breath sounds.  Musculoskeletal:     Right lower leg: Edema (trace) present.     Left lower leg: Edema (trace) present.  Skin:    General: Skin is warm and dry.     Capillary Refill: Capillary refill takes less than 2 seconds.  Neurological:     General: No focal deficit present.     Mental Status: He is alert and oriented to person, place, and time.  Psychiatric:        Mood and Affect: Mood  normal.        Behavior: Behavior normal. Behavior is cooperative.        Thought Content: Thought content normal.        Judgment: Judgment normal.      Results for orders placed or performed during the hospital encounter of 12/14/23  Comprehensive Metabolic Panel (CMET)   Collection Time: 12/14/23  2:27 PM  Result Value Ref Range   Sodium 131 (L) 135 - 145 mmol/L   Potassium 4.6 3.5 - 5.1 mmol/L   Chloride 94 (L) 98 - 111 mmol/L   CO2 22 22 - 32 mmol/L   Glucose, Bld 352 (H) 70 - 99 mg/dL   BUN 12 6 - 20 mg/dL   Creatinine, Ser 8.59 (H) 0.61 - 1.24 mg/dL   Calcium  9.8 8.9 - 10.3 mg/dL   Total Protein 7.9 6.5 - 8.1 g/dL   Albumin 4.2 3.5 - 5.0 g/dL   AST 55 (H) 15 - 41 U/L   ALT 77 (H) 0 - 44 U/L   Alkaline Phosphatase 94 38 - 126 U/L   Total Bilirubin 0.9 0.0 - 1.2 mg/dL   GFR, Estimated >39 >39 mL/min   Anion gap 15 5 - 15  B Nat Peptide   Collection Time: 12/14/23  2:27 PM  Result Value Ref Range   B Natriuretic Peptide 16.1 0.0 - 100.0 pg/mL  TSH   Collection Time: 12/14/23  2:27 PM  Result Value Ref Range   TSH 2.947 0.350 - 4.500 uIU/mL  T3, free   Collection Time: 12/14/23  2:27 PM  Result Value Ref Range   T3, Free 3.0 2.0 - 4.4 pg/mL  T4, free   Collection Time: 12/14/23  2:27 PM  Result Value Ref Range   Free T4 1.12 0.61 - 1.12 ng/dL  HgB  A1c   Collection Time: 12/14/23  2:27 PM  Result Value Ref Range   Hgb A1c MFr Bld 10.9 (H) 4.8 - 5.6 %   Mean Plasma Glucose 266.13 mg/dL       Pertinent labs & imaging results that were available during my care of the patient were reviewed by me and considered in my medical decision making.  Assessment & Plan:  Joie was seen today for elevated bs.  Diagnoses and all orders for this visit:  Type 2 diabetes mellitus with other specified complication, without long-term current use of insulin  (HCC) -     Semaglutide , 2 MG/DOSE, 8 MG/3ML SOPN; Inject 2 mg as directed once a week. -     empagliflozin   (JARDIANCE ) 25 MG TABS tablet; Take 1 tablet (25 mg total) by mouth daily before breakfast.  Chronic systolic heart failure (HCC) -     Semaglutide , 2 MG/DOSE, 8 MG/3ML SOPN; Inject 2 mg as directed once a week. -     empagliflozin  (JARDIANCE ) 25 MG TABS tablet; Take 1 tablet (25 mg total) by mouth daily before breakfast.  Gastroesophageal reflux disease without esophagitis -     omeprazole  (PRILOSEC ) 20 MG capsule; Take 1 capsule (20 mg total) by mouth 2 (two) times daily before a meal.       Type 2 diabetes mellitus with hyperglycemia, poorly controlled Type 2 diabetes mellitus with hyperglycemia, poorly controlled, as evidenced by recent A1c of 10.9 and blood glucose levels ranging from 240 to 350 mg/dL. Current medications include Ozempic  (semaglutide ) 1 mg weekly and Jardiance  (empagliflozin ) 10 mg daily. Reports nausea and lack of energy as side effects of Ozempic . Discussed the importance of optimizing current medications before considering insulin  due to potential weight gain associated with insulin . Emphasized dietary changes and medication titration to achieve glycemic control. Discussed potential switch to Mounjaro if current regimen fails to achieve control, noting better A1c and weight reduction outcomes with Mounjaro. - Increase Ozempic  to 2 mg weekly after completing current 1 mg dosing - Increase Jardiance  to 25 mg daily - Provide dietary education and handouts on diabetic eating plans - Advise on managing Ozempic  side effects: small frequent meals, avoid heavy/greasy foods, avoid carbonated beverages, increase water intake - Consider switching to Mounjaro if glycemic control is not achieved with increased Ozempic  and Jardiance  - Monitor blood glucose levels and contact if levels remain above 300 mg/dL - Schedule follow-up in 3 months  Gastroesophageal reflux disease (GERD) Gastroesophageal reflux disease managed with omeprazole . Reports nausea potentially exacerbated by  Ozempic . - Increase omeprazole  to twice daily if needed to manage nausea and reflux symptoms  Heart failure with reduced ejection fraction Heart failure with reduced ejection fraction, currently managed with Entresto , spironolactone , digoxin , and Coreg . Reports significant improvement in ejection fraction and heart function. Current regimen appears effective in managing heart failure symptoms.        I spent 40 minutes dedicated to the care of this patient on the date of this encounter to include pre-visit review of records including diagnostic studies and labs, face-to-face time with the patient discussing above conditions, post visit ordering of testing, clinical documentation in the electronic health record, making appropriate referrals as documented, and communicating necessary information to the patient's healthcare team.    Continue all other maintenance medications.  Follow up plan: Return in about 3 months (around 03/21/2024), or if symptoms worsen or fail to improve, for DM.   Continue healthy lifestyle choices, including diet (rich in fruits, vegetables, and lean  proteins, and low in salt and simple carbohydrates) and exercise (at least 30 minutes of moderate physical activity daily).  Educational handout given for DM information   The above assessment and management plan was discussed with the patient. The patient verbalized understanding of and has agreed to the management plan. Patient is aware to call the clinic if they develop any new symptoms or if symptoms persist or worsen. Patient is aware when to return to the clinic for a follow-up visit. Patient educated on when it is appropriate to go to the emergency department.   Rosaline Bruns, FNP-C [facility] [phone]

## 2024-01-03 ENCOUNTER — Ambulatory Visit: Attending: Nurse Practitioner | Admitting: Nurse Practitioner

## 2024-01-03 ENCOUNTER — Encounter: Payer: Self-pay | Admitting: Nurse Practitioner

## 2024-01-03 VITALS — BP 122/84 | HR 83 | Ht 74.0 in | Wt 332.8 lb

## 2024-01-03 DIAGNOSIS — E785 Hyperlipidemia, unspecified: Secondary | ICD-10-CM | POA: Insufficient documentation

## 2024-01-03 DIAGNOSIS — I4891 Unspecified atrial fibrillation: Secondary | ICD-10-CM | POA: Diagnosis not present

## 2024-01-03 DIAGNOSIS — I5022 Chronic systolic (congestive) heart failure: Secondary | ICD-10-CM | POA: Insufficient documentation

## 2024-01-03 DIAGNOSIS — Z79899 Other long term (current) drug therapy: Secondary | ICD-10-CM | POA: Insufficient documentation

## 2024-01-03 DIAGNOSIS — I5020 Unspecified systolic (congestive) heart failure: Secondary | ICD-10-CM | POA: Diagnosis present

## 2024-01-03 MED ORDER — SACUBITRIL-VALSARTAN 97-103 MG PO TABS: 1.0000 | ORAL_TABLET | Freq: Two times a day (BID) | ORAL | 7 refills | Status: AC

## 2024-01-03 NOTE — Progress Notes (Unsigned)
 Cardiology Office Note:  .   Date:  01/03/2024 ID:  Cole Singleton, DOB 07-28-1975, MRN [id number] PCP: Severa Rock HERO, FNP  [facility] Providers Cardiologist:  Vinie JAYSON Maxcy, MD    History of Present Illness: .   Cole Singleton is a 48 y.o. male with a PMH of HFrEF/presumed NICM, hypertension, HLD, morbid obesity, OSA, who presents today for follow-up.  Last seen by Laymon Qua, PA-C on January 28, 2022.  Was over doing well at the time, did note some intermittent dyspnea, denied any PND/orthopnea, as well as chest pain/palpitations.  It was noted he had a previous sleep study performed in July 2023 that revealed severe OSA, had not proceeded with arranging a CPAP and patient noted he was unsure if he be able to tolerate CPAP.  Was not interested in ICD placement at the time.   03/17/2023 - Today he presents for overdue follow-up.  He states he recently changed jobs and had to [place], said he ran out of medications and had a spell of heart failure symptoms that have since resolved since returning to taking his medications.  Says he could not tolerate spironolactone  as this made him feel lightheaded/drained.  Overall he is doing well from a cardiac perspective, does admit to recent allergies/sinus issues.  He is due for lab work to be drawn, says he is looking for primary care provider.  Hospitalized in February 2025 for A-fib with RVR and acute on chronic CHF exacerbation.  Was given Lovenox , started on a Cardizem  drip, and received IV Lasix , IV Lopressor , and IV Solu-Medrol .  CXR revealed cardiomegaly with pulmonary edema.  Diltiazem  drip was stopped, will start on amiodarone  drip.  Echocardiogram in the hospital which showed EF 20%, no LV thrombus, severely reduced RV (new finding), and global hypokinesis.  Was followed by advanced heart failure team, had poor response to IV Lasix , found to have severe dyspnea during conversation, cool extremities and narrow pulse  pressure.  Found to be in A-fib with RVR.  Was started on milrinone  and Lasix  drip.  Did well with milrinone  that was eventually weaned off, started on GDMT. See TEE report noted below.   Seen by heart failure clinic on July 01, 2023.  Was doing pretty good.  Weight at home was found to be 372 pounds.  Just admitted to doing dip.  Denied any alcohol, tobacco abuse, or illicit drug abuse.  Was recommended to add beta-blocker at next visit.  07/19/2023 - Today he presents for follow-up.  Overall doing well.  He admits to some generalized cramping and wonders if this is related to his medication. Denies any chest pain, shortness of breath, palpitations, syncope, presyncope, dizziness, orthopnea, PND, swelling or significant weight changes, acute bleeding, or claudication.  Weight is overall stable per his report. His weight is down 5 lbs from November 2024.  Compliant with his medications.  08/30/2023 - Overall doing well. Weight is down from last office visit. Does admit to sensation of feeling like he is in a haze. Says he believes this is related to his blood sugar and from his med changes.  He is out of digoxin  and requesting a refill of this medication.  Also says he has been out of Protonix  and has been taking Prilosec  over-the-counter and replace of this.  He is scheduled to see clinical pharmacist regarding Ozempic  tomorrow. Denies any chest pain, shortness of breath, palpitations, syncope, presyncope, dizziness, orthopnea, PND, swelling or significant weight changes, acute bleeding, or claudication.  01/03/2024 - Here for follow-up today. Doing well. Tolerating his medications well. Denies any chest pain, shortness of breath, palpitations, syncope, presyncope, dizziness, orthopnea, PND, swelling or significant weight changes, acute bleeding, or claudication.  ROS: Negative. See HPI.   Studies Reviewed: SABRA    EKG: EKG is not ordered.  Echo 11/2023:  1. Left ventricular ejection fraction, by  estimation, is 30 to 35%. The  left ventricle has moderately decreased function. The left ventricle  demonstrates global hypokinesis. The left ventricular internal cavity size  was moderately to severely dilated.  Left ventricular diastolic parameters are indeterminate.   2. Right ventricular systolic function is moderately reduced. The right  ventricular size is normal. Tricuspid regurgitation signal is inadequate  for assessing PA pressure.   3. The mitral valve is normal in structure. No evidence of mitral valve  regurgitation. No evidence of mitral stenosis.   4. The aortic valve was not well visualized. Aortic valve regurgitation  is not visualized. No aortic stenosis is present.   5. Aortic dilatation noted. There is borderline dilatation of the  ascending aorta, measuring 39 mm.   Comparison(s): A prior study was performed on 07/14/2023. EF <20%. LV is  severely dilated. Aortic dilatation noted.  Echo 06/2023:  1. Left ventricular ejection fraction, by estimation, is <20%. The left  ventricle has severely decreased function. The left ventricle demonstrates global hypokinesis. The left ventricular internal cavity size was severely dilated. Left ventricular diastolic parameters are consistent with Grade I diastolic dysfunction (impaired relaxation). Elevated left atrial pressure.   2. Right ventricular systolic function is normal. The right ventricular  size is normal. Tricuspid regurgitation signal is inadequate for assessing  PA pressure.   3. The mitral valve is normal in structure. No evidence of mitral valve  regurgitation. No evidence of mitral stenosis.   4. The aortic valve was not well visualized. Aortic valve regurgitation  is not visualized. No aortic stenosis is present.   5. Aortic dilatation noted. There is mild dilatation of the ascending  aorta, measuring 39 mm.   6. The inferior vena cava is normal in size with greater than 50%  respiratory variability, suggesting  right atrial pressure of 3 mmHg.   Comparison(s): A prior study was performed on 06/16/2023. EF 20%.Left  ventricle moderately to severely dilated.   TEE 05/2023:  Septal shift into the RV consistent with left sided volume overload.  Left ventricular ejection fraction, by estimation, is <20%. The left  ventricle has severely decreased function. The left ventricle demonstrates  global hypokinesis. The left  ventricular internal cavity size was moderately dilated.   2. Right ventricular systolic function is moderately reduced. The right  ventricular size is mildly enlarged. There is moderately elevated  pulmonary artery systolic pressure. The estimated right ventricular  systolic pressure is 56.0 mmHg.   3. Left atrial size was moderately dilated. A left atrial/left atrial  appendage thrombus was detected. The LAA emptying velocity was 28 cm/s.   4. Right atrial size was moderately dilated.   5. Moderate secondary MR. The mitral valve is normal in structure.  Moderate mitral valve regurgitation. No evidence of mitral stenosis.   6. Tricuspid valve regurgitation is mild to moderate.   7. The aortic valve is tricuspid. Aortic valve regurgitation is not  visualized.   8. The left upper pulmonary vein is abnormal.   FINDINGS   Left Ventricle: Septal shift into the RV consistent with left sided  volume overload. Left ventricular ejection fraction, by estimation,  is  Echo 10/2021:  1. Left ventricular ejection fraction, by estimation, is 20 to 25%. The  left ventricle has severely decreased function. The left ventricle  demonstrates global hypokinesis. The left ventricular internal cavity size was severely dilated. Left ventricular diastolic parameters were normal.   2. Right ventricular systolic function is normal. The right ventricular  size is normal.   3. Left atrial size was moderately dilated.   4. The mitral valve is normal in structure. Trivial mitral valve  regurgitation. No  evidence of mitral stenosis.   5. The aortic valve is tricuspid. Aortic valve regurgitation is not  visualized. No aortic stenosis is present.   6. The inferior vena cava is dilated in size with >50% respiratory  variability, suggesting right atrial pressure of 8 mmHg.  Lexiscan  05/2017:  There was no ST segment deviation noted during stress. This is a high risk study. Risk is based on decreased LVEF. There is no current myocardium at jeopardy. The left ventricular ejection fraction is severely decreased (<30%). Moderate inferior defect consistent with possible prior infarct vs subdiaphragmatic attenuation. There is no current ischemia.  Physical Exam:   VS:  BP 122/84 (BP Location: Left Arm)   Pulse 83   Ht 6' 2 (1.88 m)   Wt (!) 332 lb 12.8 oz (151 kg)   SpO2 95%   BMI 42.73 kg/m    Wt Readings from Last 3 Encounters:  01/03/24 (!) 332 lb 12.8 oz (151 kg)  12/21/23 (!) 335 lb 6.4 oz (152.1 kg)  12/14/23 (!) 332 lb 12.8 oz (151 kg)    GEN: Morbidly obese, 48 y.o. male in no acute distress NECK: No JVD; No carotid bruits CARDIAC: S1/S2, RRR, no murmurs, rubs, gallops RESPIRATORY:  Clear to auscultation without rales, wheezing or rhonchi  ABDOMEN: Soft, non-tender, non-distended EXTREMITIES:  No edema; No deformity   ASSESSMENT AND PLAN: .    HFrEF/chronic systolic CHF, medication management Stage C, NYHA class I-II symptoms. EF <20%  06/2023 with recent EF 30-35%.  Euvolemic and well compensated on exam.  Will increase Entresto  to 97/103 mg BID and obtain BMET in 2 weeks.  Continue rest of medication regimen.   Low sodium diet, fluid restriction <2L, and daily weights encouraged. Educated to contact our office for weight gain of 2 lbs overnight or 5 lbs in one week. Continue to follow-up with HF clinic as scheduled.   2. A-fib Denies any tachycardia or palpitations.  Continue amiodarone .   Continue Eliquis  for stroke prevention.  He is on appropriate dosage denies any bleeding  issues.  3. HTN SBP stable. Discussed to monitor BP at home at least 2 hours after medications and sitting for 5-10 minutes.  Increasing Entresto  as noted above.  Continue rest of medication regimen.   Heart healthy diet and regular cardiovascular exercise encouraged.   4. Morbid obesity He has lost 29 lbs since I last have seen him. Congratulated him on his weight loss. Continue Ozempic .   6. HLD LDL from March 2025 was 67.  Triglycerides 282.  Continue medication regimen. Continue follow-up with PCP.  Dispo: Follow-up with MD/APP in 3 months or sooner if anything changes.   Signed, Almarie Crate, NP

## 2024-01-03 NOTE — Patient Instructions (Addendum)
 Medication Instructions:  Please Increase Entresto  to 97-103 Mg Twice daily   Labwork: In 2 weeks at [facility]   Testing/Procedures: None   Follow-Up: Your physician recommends that you schedule a follow-up appointment in: 3 Months   Any Other Special Instructions Will Be Listed Below (If Applicable).  If you need a refill on your cardiac medications before your next appointment, please call your pharmacy.

## 2024-01-20 ENCOUNTER — Other Ambulatory Visit (HOSPITAL_COMMUNITY)
Admission: RE | Admit: 2024-01-20 | Discharge: 2024-01-20 | Disposition: A | Discharge status: Home / Self Care | Source: Ambulatory Visit | Attending: Nurse Practitioner | Admitting: Nurse Practitioner

## 2024-01-20 DIAGNOSIS — I5022 Chronic systolic (congestive) heart failure: Secondary | ICD-10-CM | POA: Diagnosis present

## 2024-01-20 DIAGNOSIS — I5020 Unspecified systolic (congestive) heart failure: Secondary | ICD-10-CM | POA: Diagnosis present

## 2024-01-20 DIAGNOSIS — Z79899 Other long term (current) drug therapy: Secondary | ICD-10-CM | POA: Insufficient documentation

## 2024-01-20 LAB — BASIC METABOLIC PANEL WITH GFR
Anion gap: 14 (ref 5–15)
BUN: 9 mg/dL (ref 6–20)
CO2: 24 mmol/L (ref 22–32)
Calcium: 9.8 mg/dL (ref 8.9–10.3)
Chloride: 97 mmol/L — ABNORMAL LOW (ref 98–111)
Creatinine, Ser: 1.42 mg/dL — ABNORMAL HIGH (ref 0.61–1.24)
GFR, Estimated: >60 mL/min (ref >60)
Glucose, Bld: 178 mg/dL — ABNORMAL HIGH (ref 70–99)
Potassium: 3.6 mmol/L (ref 3.5–5.1)
Sodium: 135 mmol/L (ref 135–145)

## 2024-02-10 ENCOUNTER — Ambulatory Visit: Payer: Self-pay | Admitting: Nurse Practitioner

## 2024-02-14 ENCOUNTER — Encounter: Payer: Self-pay | Admitting: *Deleted

## 2024-03-04 ENCOUNTER — Other Ambulatory Visit: Payer: Self-pay | Admitting: Nurse Practitioner

## 2024-03-04 DIAGNOSIS — I5022 Chronic systolic (congestive) heart failure: Secondary | ICD-10-CM

## 2024-03-23 ENCOUNTER — Ambulatory Visit: Payer: Self-pay | Admitting: Family Medicine

## 2024-03-23 ENCOUNTER — Encounter: Payer: Self-pay | Admitting: Family Medicine

## 2024-03-23 VITALS — BP 114/80 | HR 75 | Temp 98.1°F | Ht 74.0 in | Wt 336.0 lb

## 2024-03-23 DIAGNOSIS — E66813 Obesity, class 3: Secondary | ICD-10-CM

## 2024-03-23 DIAGNOSIS — I480 Paroxysmal atrial fibrillation: Secondary | ICD-10-CM

## 2024-03-23 DIAGNOSIS — E1169 Type 2 diabetes mellitus with other specified complication: Secondary | ICD-10-CM

## 2024-03-23 DIAGNOSIS — I10 Essential (primary) hypertension: Secondary | ICD-10-CM

## 2024-03-23 DIAGNOSIS — I5022 Chronic systolic (congestive) heart failure: Secondary | ICD-10-CM

## 2024-03-23 DIAGNOSIS — I152 Hypertension secondary to endocrine disorders: Secondary | ICD-10-CM

## 2024-03-23 LAB — LIPID PANEL

## 2024-03-23 LAB — BAYER DCA HB A1C WAIVED: HB A1C (BAYER DCA - WAIVED): 7.2 % — ABNORMAL HIGH (ref 4.8–5.6)

## 2024-03-23 MED ORDER — TIRZEPATIDE 5 MG/0.5ML ~~LOC~~ SOAJ: 5.0000 mg | SUBCUTANEOUS | 3 refills | Status: DC

## 2024-03-23 NOTE — Progress Notes (Signed)
 Subjective:  Patient ID: Asael Pann, male    DOB: 1975-05-12, 48 y.o.   MRN: [id number]  Patient Care Team: Severa Rock HERO, FNP as PCP - General (Family Medicine) Mona Vinie BROCKS, MD as PCP - Cardiology (Cardiology)   Chief Complaint:  Establish Care (Previous [facility] patient ) and Diabetes   HPI: Albaraa Swingle is a 48 y.o. male presenting on 03/23/2024 for Establish Care (Previous [facility] patient ) and Diabetes   Marsean Elkhatib is a 48 year old male with diabetes and heart failure who presents with side effects from Ozempic .  He has been experiencing significant side effects from Ozempic , including feeling unwell for about three days each week. He describes a cycle of feeling better just in time for the next dose, which would then make him feel unwell again. Due to these side effects, he has not taken Ozempic  for the past two weeks. A recent toothache requiring urgent care and antibiotics contributed to him missing a dose.  He has a history of heart failure and cardiomyopathy. He reports that he tires quickly. After about fifteen minutes of activity, he needs to rest. He also experiences episodes of staggering and lightheadedness, particularly when standing up quickly. His weight has fluctuated, with a recent low of 317 pounds, but he has gained some weight back since stopping Ozempic , currently weighing around 330 pounds.  His blood sugar levels have improved, with a recent reading of 156 mg/dL, down from previous levels in the 350s. He has been on amiodarone  since his hospitalization. He also takes Entresto  and Jardiance , tolerating the latter well at a dose of 25 mg.  No increased hunger, thirst, or urination recently. He has not been to an eye doctor recently, despite having diabetes and hypertension, and has completed a Cologuard test for colon cancer screening.          Relevant past medical, surgical, family, and social history reviewed and updated as indicated.   Allergies and medications reviewed and updated. Data reviewed: Chart in Epic.   Past Medical History:  Diagnosis Date   Cardiomyopathy (HCC)    Cholelithiasis    Chronic HFrEF (heart failure with reduced ejection fraction) (HCC)    Diverticulosis    Hepatic steatosis    Hypertension    Morbid obesity (HCC)    OSA (obstructive sleep apnea)    Pneumonia    Pulmonary nodule     Past Surgical History:  Procedure Laterality Date   NO PAST SURGERIES     TRANSESOPHAGEAL ECHOCARDIOGRAM (CATH LAB) N/A 06/18/2023   Procedure: TRANSESOPHAGEAL ECHOCARDIOGRAM;  Surgeon: Zenaida Morene PARAS, MD;  Location: [facility] INVASIVE CV LAB;  Service: Cardiovascular;  Laterality: N/A;    Social History   Socioeconomic History   Marital status: Married    Spouse name: Not on file   Number of children: Not on file   Years of education: Not on file   Highest education level: 12th grade  Occupational History   Not on file  Tobacco Use   Smoking status: Never   Smokeless tobacco: Current    Types: Snuff  Vaping Use   Vaping status: Never Used  Substance and Sexual Activity   Alcohol use: Yes    Comment: occas - 12 beers/yr   Drug use: No   Sexual activity: Not on file  Other Topics Concern   Not on file  Social History Narrative   Not on file   Social Drivers of Health   Financial Resource Strain: Medium  Risk (03/19/2024)   Overall Financial Resource Strain (CARDIA)    Difficulty of Paying Living Expenses: Somewhat hard  Food Insecurity: No Food Insecurity (03/19/2024)   Hunger Vital Sign    Worried About Running Out of Food in the Last Year: Never true    Ran Out of Food in the Last Year: Never true  Transportation Needs: No Transportation Needs (03/19/2024)   PRAPARE - Administrator, Civil Service (Medical): No    Lack of Transportation (Non-Medical): No  Physical Activity: Insufficiently Active (03/19/2024)   Exercise Vital Sign    Days of Exercise per Week: 2 days     Minutes of Exercise per Session: 30 min  Stress: No Stress Concern Present (03/19/2024)   [employer] of Occupational Health - Occupational Stress Questionnaire    Feeling of Stress: Only a little  Social Connections: Moderately Isolated (03/19/2024)   Social Connection and Isolation Panel    Frequency of Communication with Friends and Family: Twice a week    Frequency of Social Gatherings with Friends and Family: Once a week    Attends Religious Services: Never    Database Administrator or Organizations: No    Attends Engineer, Structural: Not on file    Marital Status: Married  Catering Manager Violence: Not At Risk (06/16/2023)   Humiliation, Afraid, Rape, and Kick questionnaire    Fear of Current or Ex-Partner: No    Emotionally Abused: No    Physically Abused: No    Sexually Abused: No    Outpatient Encounter Medications as of 03/23/2024  Medication Sig   amiodarone  (PACERONE ) 200 MG tablet Take 1 tablet (200 mg total) by mouth daily.   apixaban  (ELIQUIS ) 5 MG TABS tablet Take 1 tablet (5 mg total) by mouth 2 (two) times daily.   carvedilol  (COREG ) 3.125 MG tablet Take 1 tablet (3.125 mg total) by mouth 2 (two) times daily.   digoxin  (LANOXIN ) 0.125 MG tablet Take 1 tablet (125 mcg total) by mouth daily.   empagliflozin  (JARDIANCE ) 25 MG TABS tablet Take 1 tablet (25 mg total) by mouth daily before breakfast.   omeprazole  (PRILOSEC ) 20 MG capsule Take 1 capsule (20 mg total) by mouth 2 (two) times daily before a meal.   potassium chloride  SA (KLOR-CON  M) 20 MEQ tablet Take 1 tablet (20 mEq total) by mouth daily.   rosuvastatin  (CRESTOR ) 40 MG tablet Take 1 tablet (40 mg total) by mouth daily.   sacubitril -valsartan  (ENTRESTO ) 97-103 MG Take 1 tablet by mouth 2 (two) times daily.   spironolactone  (ALDACTONE ) 25 MG tablet Take 1 tablet (25 mg total) by mouth daily.   tirzepatide (MOUNJARO) 5 MG/0.5ML Pen Inject 5 mg into the skin once a week.   torsemide  (DEMADEX ) 20  MG tablet Take 2 tablets (40 mg total) by mouth daily.   [DISCONTINUED] Semaglutide , 2 MG/DOSE, 8 MG/3ML SOPN Inject 2 mg as directed once a week. (Patient not taking: Reported on 03/23/2024)   No facility-administered encounter medications on file as of 03/23/2024.    Allergies  Allergen Reactions   Codeine Other (See Comments)    Severe headache Pt reports being able to take percocet & vicodin    Pertinent ROS per HPI, otherwise unremarkable      Objective:  BP 114/80   Pulse 75   Temp 98.1 F (36.7 C)   Ht 6' 2 (1.88 m)   Wt (!) 336 lb (152.4 kg)   SpO2 95%   BMI 43.14 kg/m  Wt Readings from Last 3 Encounters:  03/23/24 (!) 336 lb (152.4 kg)  01/03/24 (!) 332 lb 12.8 oz (151 kg)  12/21/23 (!) 335 lb 6.4 oz (152.1 kg)    Physical Exam Vitals and nursing note reviewed.  Constitutional:      General: He is not in acute distress.    Appearance: Normal appearance. He is well-developed and well-groomed. He is morbidly obese. He is not ill-appearing, toxic-appearing or diaphoretic.  HENT:     Head: Normocephalic and atraumatic.     Jaw: There is normal jaw occlusion.     Right Ear: Hearing normal.     Left Ear: Hearing normal.     Nose: Nose normal.     Mouth/Throat:     Lips: Pink.     Mouth: Mucous membranes are moist.     Pharynx: Oropharynx is clear. Uvula midline.  Eyes:     General: Lids are normal.     Extraocular Movements: Extraocular movements intact.     Conjunctiva/sclera: Conjunctivae normal.     Pupils: Pupils are equal, round, and reactive to light.  Neck:     Trachea: Trachea and phonation normal.  Cardiovascular:     Rate and Rhythm: Normal rate and regular rhythm.     Chest Wall: PMI is not displaced.     Pulses: Normal pulses.          Dorsalis pedis pulses are 2+ on the right side and 2+ on the left side.       Posterior tibial pulses are 2+ on the right side and 2+ on the left side.     Heart sounds: Normal heart sounds. No murmur  heard.    No friction rub. No gallop.  Pulmonary:     Effort: Pulmonary effort is normal. No respiratory distress.     Breath sounds: Normal breath sounds. No wheezing.  Abdominal:     General: Bowel sounds are normal.     Palpations: Abdomen is soft.  Musculoskeletal:        General: Normal range of motion.     Cervical back: Normal range of motion and neck supple.     Right lower leg: No edema.     Left lower leg: No edema.     Right foot: Normal range of motion. No deformity, bunion, Charcot foot, foot drop or prominent metatarsal heads.     Left foot: Normal range of motion. No deformity, bunion, Charcot foot, foot drop or prominent metatarsal heads.  Feet:     Right foot:     Protective Sensation: 10 sites tested.  10 sites sensed.     Skin integrity: Skin integrity normal.     Toenail Condition: Right toenails are normal.     Left foot:     Protective Sensation: 10 sites tested.  10 sites sensed.     Skin integrity: Skin integrity normal.     Toenail Condition: Left toenails are normal.  Skin:    General: Skin is warm and dry.     Capillary Refill: Capillary refill takes less than 2 seconds.     Coloration: Skin is not cyanotic, jaundiced or pale.     Findings: No rash.  Neurological:     General: No focal deficit present.     Mental Status: He is alert and oriented to person, place, and time.     Sensory: Sensation is intact.     Motor: Motor function is intact.     Coordination: Coordination is  intact.     Gait: Gait is intact.     Deep Tendon Reflexes: Reflexes are normal and symmetric.  Psychiatric:        Attention and Perception: Attention and perception normal.        Mood and Affect: Mood and affect normal.        Speech: Speech normal.        Behavior: Behavior normal. Behavior is cooperative.        Thought Content: Thought content normal.        Cognition and Memory: Cognition and memory normal.        Judgment: Judgment normal.     Results for orders  placed or performed during the hospital encounter of 01/20/24  Basic Metabolic Panel (BMET)   Collection Time: 01/20/24  3:14 PM  Result Value Ref Range   Sodium 135 135 - 145 mmol/L   Potassium 3.6 3.5 - 5.1 mmol/L   Chloride 97 (L) 98 - 111 mmol/L   CO2 24 22 - 32 mmol/L   Glucose, Bld 178 (H) 70 - 99 mg/dL   BUN 9 6 - 20 mg/dL   Creatinine, Ser 8.57 (H) 0.61 - 1.24 mg/dL   Calcium  9.8 8.9 - 10.3 mg/dL   GFR, Estimated >39 >39 mL/min   Anion gap 14 5 - 15       Pertinent labs & imaging results that were available during my care of the patient were reviewed by me and considered in my medical decision making.  Assessment & Plan:  Srijan was seen today for establish care and diabetes.  Diagnoses and all orders for this visit:  Type 2 diabetes mellitus with other specified complication, without long-term current use of insulin  (HCC) -     Bayer DCA Hb A1c Waived -     CMP14+EGFR -     CBC with Differential/Platelet -     Lipid panel -     Microalbumin / creatinine urine ratio -     tirzepatide (MOUNJARO) 5 MG/0.5ML Pen; Inject 5 mg into the skin once a week.  Chronic systolic heart failure (HCC) -     CMP14+EGFR -     CBC with Differential/Platelet -     tirzepatide (MOUNJARO) 5 MG/0.5ML Pen; Inject 5 mg into the skin once a week.  Hypertension associated with diabetes (HCC) -     Bayer DCA Hb A1c Waived -     CMP14+EGFR -     CBC with Differential/Platelet -     Lipid panel -     Microalbumin / creatinine urine ratio -     tirzepatide (MOUNJARO) 5 MG/0.5ML Pen; Inject 5 mg into the skin once a week.  Paroxysmal atrial fibrillation with RVR (HCC) -     CMP14+EGFR -     CBC with Differential/Platelet -     Lipid panel -     Microalbumin / creatinine urine ratio -     tirzepatide (MOUNJARO) 5 MG/0.5ML Pen; Inject 5 mg into the skin once a week.  Obesity, Class III, BMI 40-49.9 (morbid obesity) (HCC) -     Bayer DCA Hb A1c Waived -     CMP14+EGFR -     CBC with  Differential/Platelet -     Lipid panel -     Microalbumin / creatinine urine ratio -     tirzepatide (MOUNJARO) 5 MG/0.5ML Pen; Inject 5 mg into the skin once a week.      Type 2 diabetes mellitus with  other specified complication Blood sugar levels have improved, with a recent reading of 156 mg/dL, down from previous levels in the 350s. Previous treatment with Ozempic  was discontinued due to significant gastrointestinal side effects, including nausea and vomiting, occurring three days a week. Transitioning to tirzepatide Cloyde) is planned to manage diabetes with potentially fewer side effects. Tirzepatide is expected to aid in weight management and improve cardiac function, beneficial for heart failure patients. - Initiated tirzepatide (Mounjaro) at 5 mg. - Provided a box of 2.5 mg tirzepatide for a four-week course pending insurance approval. - Continue Jardiance  25 mg.  Chronic systolic heart failure Reports fatigue and lightheadedness, possibly related to weight loss and medication side effects. Recent echocardiogram showed improvement in ejection fraction from 17% to 35%. GLP-1 receptor agonists like tirzepatide are beneficial for heart failure management. - Continue Entresto .  Paroxysmal atrial fibrillation Currently on amiodarone , which may affect thyroid  function. Regular monitoring of thyroid  function is necessary due to potential interference with cardiac function. - Continue amiodarone . - Monitor thyroid  function regularly.  Obesity, class III Weight has fluctuated, with recent weight at 330 lbs. Previous weight loss was associated with gastrointestinal side effects from Ozempic . Transitioning to tirzepatide may aid in weight management with fewer side effects. - Initiated tirzepatide Cloyde) for weight management.  General health maintenance Due for an eye exam to monitor for diabetic retinopathy. Tetanus vaccination status is unclear and may need updating. - Schedule  an eye exam for diabetic retinopathy screening. - Check tetanus vaccination status and update if necessary.          Continue all other maintenance medications.  Follow up plan: Return in about 3 months (around 06/21/2024), or if symptoms worsen or fail to improve, for DM.   Continue healthy lifestyle choices, including diet (rich in fruits, vegetables, and lean proteins, and low in salt and simple carbohydrates) and exercise (at least 30 minutes of moderate physical activity daily).  Educational handout given for DM  The above assessment and management plan was discussed with the patient. The patient verbalized understanding of and has agreed to the management plan. Patient is aware to call the clinic if they develop any new symptoms or if symptoms persist or worsen. Patient is aware when to return to the clinic for a follow-up visit. Patient educated on when it is appropriate to go to the emergency department.   Rosaline Bruns, FNP-C [facility] [phone]

## 2024-03-23 NOTE — Patient Instructions (Signed)

## 2024-03-24 ENCOUNTER — Other Ambulatory Visit (HOSPITAL_COMMUNITY): Payer: Self-pay

## 2024-03-24 ENCOUNTER — Telehealth: Payer: Self-pay | Admitting: Pharmacy Technician

## 2024-03-24 DIAGNOSIS — E1169 Type 2 diabetes mellitus with other specified complication: Secondary | ICD-10-CM

## 2024-03-24 LAB — CMP14+EGFR
ALT: 60 IU/L — AB (ref 0–44)
AST: 37 IU/L (ref 0–40)
Albumin: 4.1 g/dL (ref 4.1–5.1)
Alkaline Phosphatase: 75 IU/L (ref 47–123)
BUN/Creatinine Ratio: 11 (ref 9–20)
BUN: 13 mg/dL (ref 6–24)
Bilirubin Total: 0.5 mg/dL (ref 0.0–1.2)
CO2: 23 mmol/L (ref 20–29)
Calcium: 9.3 mg/dL (ref 8.7–10.2)
Chloride: 100 mmol/L (ref 96–106)
Creatinine, Ser: 1.22 mg/dL (ref 0.76–1.27)
Globulin, Total: 2.4 g/dL (ref 1.5–4.5)
Glucose: 158 mg/dL — AB (ref 70–99)
Potassium: 4.6 mmol/L (ref 3.5–5.2)
Sodium: 138 mmol/L (ref 134–144)
Total Protein: 6.5 g/dL (ref 6.0–8.5)
eGFR: 73 mL/min/1.73 (ref >59)

## 2024-03-24 LAB — LIPID PANEL
Cholesterol, Total: 122 mg/dL (ref 100–199)
HDL: 46 mg/dL (ref >39)
LDL CALC COMMENT:: 2.7 ratio (ref 0.0–5.0)
LDL Chol Calc (NIH): 48 mg/dL (ref 0–99)
Triglycerides: 165 mg/dL — AB (ref 0–149)
VLDL Cholesterol Cal: 28 mg/dL (ref 5–40)

## 2024-03-24 LAB — CBC WITH DIFFERENTIAL/PLATELET
Basophils Absolute: 0 x10E3/uL (ref 0.0–0.2)
Basos: 1 %
EOS (ABSOLUTE): 0.1 x10E3/uL (ref 0.0–0.4)
Eos: 1 %
Hematocrit: 45.7 % (ref 37.5–51.0)
Hemoglobin: 14.5 g/dL (ref 13.0–17.7)
Immature Grans (Abs): 0 x10E3/uL (ref 0.0–0.1)
Immature Granulocytes: 0 %
Lymphocytes Absolute: 1 x10E3/uL (ref 0.7–3.1)
Lymphs: 17 %
MCH: 27.5 pg (ref 26.6–33.0)
MCHC: 31.7 g/dL (ref 31.5–35.7)
MCV: 87 fL (ref 79–97)
Monocytes Absolute: 0.4 x10E3/uL (ref 0.1–0.9)
Monocytes: 7 %
Neutrophils Absolute: 4.5 x10E3/uL (ref 1.4–7.0)
Neutrophils: 73 %
Platelets: 231 x10E3/uL (ref 150–450)
RBC: 5.28 x10E6/uL (ref 4.14–5.80)
RDW: 14.4 % (ref 11.6–15.4)
WBC: 6 x10E3/uL (ref 3.4–10.8)

## 2024-03-24 LAB — MICROALBUMIN / CREATININE URINE RATIO
Creatinine, Urine: 16.9 mg/dL
Microalb/Creat Ratio: 61 mg/g{creat} — ABNORMAL HIGH (ref 0–29)
Microalbumin, Urine: 10.3 ug/mL

## 2024-03-24 NOTE — Telephone Encounter (Signed)
 Pharmacy Patient Advocate Encounter   Received notification from Onbase that prior authorization for Mounjaro  5MG /0.5ML auto-injectors is required/requested.   Insurance verification completed.   The patient is insured through [facility] MEDICAID.   Per test claim: PA required; PA submitted to above mentioned insurance via Latent Key/confirmation #/EOC [id number] Status is pending

## 2024-03-24 NOTE — Telephone Encounter (Signed)
 Pharmacy Patient Advocate Encounter  Received notification from [facility] MEDICAID that Prior Authorization for Mounjaro  5MG /0.5ML auto-injectors has been DENIED.  Full denial letter will be uploaded to the media tab. See denial reason below.    PA #/Case ID/Reference #: [id number]

## 2024-04-03 ENCOUNTER — Ambulatory Visit: Attending: Nurse Practitioner | Admitting: Nurse Practitioner

## 2024-04-03 ENCOUNTER — Encounter: Payer: Self-pay | Admitting: Nurse Practitioner

## 2024-04-03 VITALS — BP 118/80 | HR 67 | Ht 74.0 in | Wt 342.4 lb

## 2024-04-03 DIAGNOSIS — E785 Hyperlipidemia, unspecified: Secondary | ICD-10-CM | POA: Insufficient documentation

## 2024-04-03 DIAGNOSIS — I4891 Unspecified atrial fibrillation: Secondary | ICD-10-CM | POA: Insufficient documentation

## 2024-04-03 DIAGNOSIS — I10 Essential (primary) hypertension: Secondary | ICD-10-CM

## 2024-04-03 DIAGNOSIS — I5020 Unspecified systolic (congestive) heart failure: Secondary | ICD-10-CM | POA: Insufficient documentation

## 2024-04-03 DIAGNOSIS — I5022 Chronic systolic (congestive) heart failure: Secondary | ICD-10-CM

## 2024-04-03 NOTE — Progress Notes (Unsigned)
 Cardiology Office Note:  .   Date:  01/03/2024 ID:  Cole Singleton, DOB 03/13/1976, MRN [id number] PCP: Severa Rock HERO, FNP  [facility] Providers Cardiologist:  Vinie JAYSON Maxcy, MD    History of Present Illness: .   Cole Singleton is a 48 y.o. male with a PMH of HFrEF/presumed NICM, hypertension, HLD, morbid obesity, OSA, who presents today for follow-up.  Last seen by Laymon Qua, PA-C on January 28, 2022.  Was over doing well at the time, did note some intermittent dyspnea, denied any PND/orthopnea, as well as chest pain/palpitations.  It was noted he had a previous sleep study performed in July 2023 that revealed severe OSA, had not proceeded with arranging a CPAP and patient noted he was unsure if he be able to tolerate CPAP.  Was not interested in ICD placement at the time.   03/17/2023 - Today he presents for overdue follow-up.  He states he recently changed jobs and had to [place], said he ran out of medications and had a spell of heart failure symptoms that have since resolved since returning to taking his medications.  Says he could not tolerate spironolactone  as this made him feel lightheaded/drained.  Overall he is doing well from a cardiac perspective, does admit to recent allergies/sinus issues.  He is due for lab work to be drawn, says he is looking for primary care provider.  Hospitalized in February 2025 for A-fib with RVR and acute on chronic CHF exacerbation.  Was given Lovenox , started on a Cardizem  drip, and received IV Lasix , IV Lopressor , and IV Solu-Medrol .  CXR revealed cardiomegaly with pulmonary edema.  Diltiazem  drip was stopped, will start on amiodarone  drip.  Echocardiogram in the hospital which showed EF 20%, no LV thrombus, severely reduced RV (new finding), and global hypokinesis.  Was followed by advanced heart failure team, had poor response to IV Lasix , found to have severe dyspnea during conversation, cool extremities and narrow pulse  pressure.  Found to be in A-fib with RVR.  Was started on milrinone  and Lasix  drip.  Did well with milrinone  that was eventually weaned off, started on GDMT. See TEE report noted below.   Seen by heart failure clinic on July 01, 2023.  Was doing pretty good.  Weight at home was found to be 372 pounds.  Just admitted to doing dip.  Denied any alcohol, tobacco abuse, or illicit drug abuse.  Was recommended to add beta-blocker at next visit.  07/19/2023 - Today he presents for follow-up.  Overall doing well.  He admits to some generalized cramping and wonders if this is related to his medication. Denies any chest pain, shortness of breath, palpitations, syncope, presyncope, dizziness, orthopnea, PND, swelling or significant weight changes, acute bleeding, or claudication.  Weight is overall stable per his report. His weight is down 5 lbs from November 2024.  Compliant with his medications.  08/30/2023 - Overall doing well. Weight is down from last office visit. Does admit to sensation of feeling like he is in a haze. Says he believes this is related to his blood sugar and from his med changes.  He is out of digoxin  and requesting a refill of this medication.  Also says he has been out of Protonix  and has been taking Prilosec  over-the-counter and replace of this.  He is scheduled to see clinical pharmacist regarding Ozempic  tomorrow. Denies any chest pain, shortness of breath, palpitations, syncope, presyncope, dizziness, orthopnea, PND, swelling or significant weight changes, acute bleeding, or claudication.  01/03/2024 - Here for follow-up today. Doing well. Tolerating his medications well. Denies any chest pain, shortness of breath, palpitations, syncope, presyncope, dizziness, orthopnea, PND, swelling or significant weight changes, acute bleeding, or claudication.  ROS: Negative. See HPI.   Studies Reviewed: SABRA    EKG: EKG is not ordered.  Echo 11/2023:  1. Left ventricular ejection fraction, by  estimation, is 30 to 35%. The  left ventricle has moderately decreased function. The left ventricle  demonstrates global hypokinesis. The left ventricular internal cavity size  was moderately to severely dilated.  Left ventricular diastolic parameters are indeterminate.   2. Right ventricular systolic function is moderately reduced. The right  ventricular size is normal. Tricuspid regurgitation signal is inadequate  for assessing PA pressure.   3. The mitral valve is normal in structure. No evidence of mitral valve  regurgitation. No evidence of mitral stenosis.   4. The aortic valve was not well visualized. Aortic valve regurgitation  is not visualized. No aortic stenosis is present.   5. Aortic dilatation noted. There is borderline dilatation of the  ascending aorta, measuring 39 mm.   Comparison(s): A prior study was performed on 07/14/2023. EF <20%. LV is  severely dilated. Aortic dilatation noted.  Echo 06/2023:  1. Left ventricular ejection fraction, by estimation, is <20%. The left  ventricle has severely decreased function. The left ventricle demonstrates global hypokinesis. The left ventricular internal cavity size was severely dilated. Left ventricular diastolic parameters are consistent with Grade I diastolic dysfunction (impaired relaxation). Elevated left atrial pressure.   2. Right ventricular systolic function is normal. The right ventricular  size is normal. Tricuspid regurgitation signal is inadequate for assessing  PA pressure.   3. The mitral valve is normal in structure. No evidence of mitral valve  regurgitation. No evidence of mitral stenosis.   4. The aortic valve was not well visualized. Aortic valve regurgitation  is not visualized. No aortic stenosis is present.   5. Aortic dilatation noted. There is mild dilatation of the ascending  aorta, measuring 39 mm.   6. The inferior vena cava is normal in size with greater than 50%  respiratory variability, suggesting  right atrial pressure of 3 mmHg.   Comparison(s): A prior study was performed on 06/16/2023. EF 20%.Left  ventricle moderately to severely dilated.   TEE 05/2023:  Septal shift into the RV consistent with left sided volume overload.  Left ventricular ejection fraction, by estimation, is <20%. The left  ventricle has severely decreased function. The left ventricle demonstrates  global hypokinesis. The left  ventricular internal cavity size was moderately dilated.   2. Right ventricular systolic function is moderately reduced. The right  ventricular size is mildly enlarged. There is moderately elevated  pulmonary artery systolic pressure. The estimated right ventricular  systolic pressure is 56.0 mmHg.   3. Left atrial size was moderately dilated. A left atrial/left atrial  appendage thrombus was detected. The LAA emptying velocity was 28 cm/s.   4. Right atrial size was moderately dilated.   5. Moderate secondary MR. The mitral valve is normal in structure.  Moderate mitral valve regurgitation. No evidence of mitral stenosis.   6. Tricuspid valve regurgitation is mild to moderate.   7. The aortic valve is tricuspid. Aortic valve regurgitation is not  visualized.   8. The left upper pulmonary vein is abnormal.   FINDINGS   Left Ventricle: Septal shift into the RV consistent with left sided  volume overload. Left ventricular ejection fraction, by estimation,  is  Echo 10/2021:  1. Left ventricular ejection fraction, by estimation, is 20 to 25%. The  left ventricle has severely decreased function. The left ventricle  demonstrates global hypokinesis. The left ventricular internal cavity size was severely dilated. Left ventricular diastolic parameters were normal.   2. Right ventricular systolic function is normal. The right ventricular  size is normal.   3. Left atrial size was moderately dilated.   4. The mitral valve is normal in structure. Trivial mitral valve  regurgitation. No  evidence of mitral stenosis.   5. The aortic valve is tricuspid. Aortic valve regurgitation is not  visualized. No aortic stenosis is present.   6. The inferior vena cava is dilated in size with >50% respiratory  variability, suggesting right atrial pressure of 8 mmHg.  Lexiscan  05/2017:  There was no ST segment deviation noted during stress. This is a high risk study. Risk is based on decreased LVEF. There is no current myocardium at jeopardy. The left ventricular ejection fraction is severely decreased (<30%). Moderate inferior defect consistent with possible prior infarct vs subdiaphragmatic attenuation. There is no current ischemia.  Physical Exam:   VS:  BP 118/80   Pulse 67   Ht 6' 2 (1.88 m)   Wt (!) 342 lb 6.4 oz (155.3 kg)   SpO2 97%   BMI 43.96 kg/m    Wt Readings from Last 3 Encounters:  04/03/24 (!) 342 lb 6.4 oz (155.3 kg)  03/23/24 (!) 336 lb (152.4 kg)  01/03/24 (!) 332 lb 12.8 oz (151 kg)    GEN: Morbidly obese, 48 y.o. male in no acute distress NECK: No JVD; No carotid bruits CARDIAC: S1/S2, RRR, no murmurs, rubs, gallops RESPIRATORY:  Clear to auscultation without rales, wheezing or rhonchi  ABDOMEN: Soft, non-tender, non-distended EXTREMITIES:  No edema; No deformity   ASSESSMENT AND PLAN: .    HFrEF/chronic systolic CHF, medication management Stage C, NYHA class I-II symptoms. EF <20%  06/2023 with recent EF 30-35%.  Euvolemic and well compensated on exam.  Will increase Entresto  to 97/103 mg BID and obtain BMET in 2 weeks.  Continue rest of medication regimen. Low sodium diet, fluid restriction <2L, and daily weights encouraged. Educated to contact our office for weight gain of 2 lbs overnight or 5 lbs in one week. Continue to follow-up with HF clinic as scheduled.   2. A-fib Denies any tachycardia or palpitations.  Continue amiodarone .   Continue Eliquis  for stroke prevention.  He is on appropriate dosage denies any bleeding issues.  3. HTN SBP stable.  Discussed to monitor BP at home at least 2 hours after medications and sitting for 5-10 minutes.  Increasing Entresto  as noted above.  Continue rest of medication regimen.   Heart healthy diet and regular cardiovascular exercise encouraged.   4. Morbid obesity He has lost 29 lbs since I last have seen him. Congratulated him on his weight loss. Continue Ozempic .   6. HLD LDL from March 2025 was 67.  Triglycerides 282.  Continue medication regimen. Continue follow-up with PCP.  Dispo: Follow-up with MD/APP in 3 months or sooner if anything changes.   Signed, Almarie Crate, NP

## 2024-04-03 NOTE — Patient Instructions (Addendum)
 Medication Instructions:   Stop Digoxin  Continue all other medications.     Labwork:  none  Testing/Procedures:  none  Follow-Up:  6 months   Any Other Special Instructions Will Be Listed Below (If Applicable).  Follow up with CHF clinic - overdue.   If you need a refill on your cardiac medications before your next appointment, please call your pharmacy.

## 2024-04-16 ENCOUNTER — Encounter: Payer: Self-pay | Admitting: Family Medicine

## 2024-05-01 ENCOUNTER — Ambulatory Visit (HOSPITAL_COMMUNITY)
Admission: RE | Admit: 2024-05-01 | Discharge: 2024-05-01 | Disposition: A | Source: Ambulatory Visit | Attending: Cardiology | Admitting: Cardiology

## 2024-05-01 ENCOUNTER — Encounter (HOSPITAL_COMMUNITY): Payer: Self-pay | Admitting: Cardiology

## 2024-05-01 VITALS — BP 130/80 | HR 69 | Wt 342.4 lb

## 2024-05-01 DIAGNOSIS — I4891 Unspecified atrial fibrillation: Secondary | ICD-10-CM | POA: Diagnosis not present

## 2024-05-01 DIAGNOSIS — Z79899 Other long term (current) drug therapy: Secondary | ICD-10-CM | POA: Diagnosis not present

## 2024-05-01 DIAGNOSIS — Z7984 Long term (current) use of oral hypoglycemic drugs: Secondary | ICD-10-CM | POA: Insufficient documentation

## 2024-05-01 DIAGNOSIS — I5022 Chronic systolic (congestive) heart failure: Secondary | ICD-10-CM | POA: Insufficient documentation

## 2024-05-01 DIAGNOSIS — E669 Obesity, unspecified: Secondary | ICD-10-CM | POA: Diagnosis not present

## 2024-05-01 DIAGNOSIS — I429 Cardiomyopathy, unspecified: Secondary | ICD-10-CM | POA: Insufficient documentation

## 2024-05-01 DIAGNOSIS — E119 Type 2 diabetes mellitus without complications: Secondary | ICD-10-CM | POA: Diagnosis not present

## 2024-05-01 DIAGNOSIS — Z7985 Long-term (current) use of injectable non-insulin antidiabetic drugs: Secondary | ICD-10-CM | POA: Insufficient documentation

## 2024-05-01 DIAGNOSIS — E785 Hyperlipidemia, unspecified: Secondary | ICD-10-CM | POA: Diagnosis not present

## 2024-05-01 MED ORDER — ONDANSETRON HCL 4 MG PO TABS: 4.0000 mg | ORAL_TABLET | Freq: Three times a day (TID) | ORAL | 2 refills | Status: AC | PRN

## 2024-05-01 MED ORDER — CARVEDILOL 6.25 MG PO TABS: 6.2500 mg | ORAL_TABLET | Freq: Two times a day (BID) | ORAL | 3 refills | Status: AC

## 2024-05-01 NOTE — Progress Notes (Signed)
" ° °  ADVANCED HEART FAILURE FOLLOW UP CLINIC NOTE  Referring Physician: Severa Rock HERO, FNP  Primary Care: Severa Rock HERO, FNP Primary Cardiologist:  HPI: Cole Singleton is a 49 y.o. male who presents for follow up of chronic systolic heart faliure.      Established care with [facility] in 2016. EF 20-25%. Lost to follow up until 2019. Nuclear stress in 2019 showed mod inferior defect consistent with prior infact. Has continued to follow with intermittent lapses in medication adherence.       2/25 he developed viral URI and was treated with Augmentin, however symptoms worsened and he developed palpitations and abdominal bloating. He then took PRN Lasix  without increase in UOP and developed progressive shortness of breath.    Admitted 2/25 with AF with RVR. Decompensated after dilt drip was started, required IV milrinone  and aggressive diuresis. TEE/DCCV showed LVEF <20%, RV moderately down and + thrombus, so no DCCV. Chemically converted to NSR. Cath deferred given conversion. Drips weaned and GDMT titrated. He was discharged home, weight 362 lbs.     SUBJECTIVE:  Overall doing fairly well, feeling improved now that his diabetes is under better control.   PMH, current medications, allergies, social history, and family history reviewed in epic.  PHYSICAL EXAM: Vitals:   05/01/24 1347  BP: 130/80  Pulse: 69  SpO2: 96%   GENERAL: Well nourished and in no apparent distress at rest, obese PULM:  Normal work of breathing, clear to auscultation bilaterally. Respirations are unlabored.  CARDIAC:  JVP: flat         Normal rate with regular rhythm. No murmurs, rubs or gallops.  Trace edema. Warm and well perfused extremities. ABDOMEN: Soft, non-tender, non-distended. NEUROLOGIC: Patient is oriented x3 with no focal or lateralizing neurologic deficits.    DATA REVIEW  ECG: 09/06/2023: NSR, cannot rule out anterior infarct   12/01/2023: LVEF 30-35%, moderately to severely  dilated, RV function moderately reduced  ECHO: 10/2021: LVEF 25-30%, global hypokinesis   CATH: None   ASSESSMENT & PLAN:  Chronic systolic heart failure: Patient with long standing cardiomyopathy, recent decompensation due to atrial fibrillation. No ischemic evaluation, EF still moderately reduced but BP not optimized. - Increase entresto  to 49/51mg  BID - Continue digoxin  0.125mg  daily - Continue carvedilol  3.125mg  BID - Continue spironolactone  25mg  daily - Continue jardiance  10mg  daily - Decrease torsemide  to 40mg  daily - Decrease Kcl to 20mEq daily - Continue to maximize GDMT, if still reduced can consider ischemic evaluation and ICD  Diabetes: Symptoms concerning for progression of disease including increased thirst, fatigue. - Decrease diuretics, obtain A1c - Follow up urgently with PCP  Atrial fibrillation: Currently in NSR. - Continue amiodarone  200mg  daily - Labs at next visit  Obesity: Has lost over 30lbs since discharge. - Continue GLP-1  HLD:  - Continue crestor   Follow up in 2-3 months  Morene Brownie, MD Advanced Heart Failure Mechanical Circulatory Support 05/01/2024 "

## 2024-05-01 NOTE — Patient Instructions (Signed)
 STOP Amiodarone .  CHANGE Carvedilol  to 6.25 mg Twice daily  Your physician has requested that you have an echocardiogram. Echocardiography is a painless test that uses sound waves to create images of your heart. It provides your doctor with information about the size and shape of your heart and how well your hearts chambers and valves are working. This procedure takes approximately one hour. There are no restrictions for this procedure. Please do NOT wear cologne, perfume, aftershave, or lotions (deodorant is allowed). Please arrive 15 minutes prior to your appointment time.  Please note: We ask at that you not bring children with you during ultrasound (echo/ vascular) testing. Due to room size and safety concerns, children are not allowed in the ultrasound rooms during exams. Our front office staff cannot provide observation of children in our lobby area while testing is being conducted. An adult accompanying a patient to their appointment will only be allowed in the ultrasound room at the discretion of the ultrasound technician under special circumstances. We apologize for any inconvenience.  Your physician recommends that you schedule a follow-up appointment in: 5 months with an echocardiogram ( June) ** PLEASE CALL THE OFFICE IN APRIL TO ARRANGE YOUR FOLLOW UP APPOINTMENT.**  If you have any questions or concerns before your next appointment please send us  a message through [place] or call our office at [phone].    TO LEAVE A MESSAGE FOR THE NURSE SELECT OPTION 2, PLEASE LEAVE A MESSAGE INCLUDING: YOUR NAME DATE OF BIRTH CALL BACK NUMBER REASON FOR CALL**this is important as we prioritize the call backs  YOU WILL RECEIVE A CALL BACK THE SAME DAY AS LONG AS YOU CALL BEFORE 4:00 PM  At the [facility] Failure Clinic, you and your health needs are our priority. As part of our continuing mission to provide you with exceptional heart care, we have created designated Provider Care Teams.  These Care Teams include your primary Cardiologist (physician) and Advanced Practice Providers (APPs- Physician Assistants and Nurse Practitioners) who all work together to provide you with the care you need, when you need it.   You may see any of the following providers on your designated Care Team at your next follow up: Dr Toribio Fuel Dr Ezra Shuck Dr. Morene Brownie Greig Mosses, NP Caffie Shed, [place] [facility] [place], [place] Beckey Coe, NP Jordan Lee, NP Ellouise Class, NP Tinnie Redman, PharmD Jaun Bash, PharmD   Please be sure to bring in all your medications bottles to every appointment.    Thank you for choosing [facility]

## 2024-05-02 NOTE — Telephone Encounter (Signed)
 Does patient have to try and fail them all? Insurance requires (trulicity , victoza, byetta before Mounjaro ) Patient has only tried Ozempic  per chart review I don't think there is much way around this.

## 2024-05-03 ENCOUNTER — Telehealth: Payer: Self-pay | Admitting: Family Medicine

## 2024-05-03 NOTE — Telephone Encounter (Signed)
 NA

## 2024-05-03 NOTE — Telephone Encounter (Signed)
Refer to other phone encounter.

## 2024-05-03 NOTE — Telephone Encounter (Signed)
 Left message for patient to call back

## 2024-05-03 NOTE — Telephone Encounter (Unsigned)
 Copied from CRM [id number]. Topic: General - Other >> May 03, 2024  2:04 PM Diannia H wrote: Reason for CRM: Patient is returning a call for Cole Singleton, CMA. Could you assist? Patient callback is [phone]

## 2024-05-04 MED ORDER — TRULICITY 0.75 MG/0.5ML ~~LOC~~ SOAJ: 0.7500 mg | SUBCUTANEOUS | 2 refills | Status: AC

## 2024-05-04 NOTE — Addendum Note (Signed)
 Addended by: SEVERA ROSALINE HERO on: 05/04/2024 04:10 PM   Modules accepted: Orders

## 2024-05-04 NOTE — Telephone Encounter (Signed)
 Patient says he would like to try Trulicity 

## 2024-05-04 NOTE — Telephone Encounter (Signed)
 Left detailed message making patient aware that PCP sent the Trulicity  Rx in to the pharmacy for him to pick up. Advised for patient to call back if he has any questions.

## 2024-05-09 ENCOUNTER — Telehealth: Payer: Self-pay

## 2024-05-09 ENCOUNTER — Other Ambulatory Visit (HOSPITAL_COMMUNITY): Payer: Self-pay

## 2024-05-09 NOTE — Telephone Encounter (Signed)
 Pharmacy Patient Advocate Encounter   Received notification from Physician's Office that prior authorization for Trulicity  0.75MG /0.5ML auto-injectors   is required/requested.   Insurance verification completed.   The patient is insured through [employer].   Per test claim: PA required; PA submitted to above mentioned insurance via Latent Key/confirmation #/EOC [id number] Status is pending

## 2024-05-09 NOTE — Telephone Encounter (Signed)
 Notified via MyChart. LS

## 2024-05-09 NOTE — Telephone Encounter (Signed)
 Pharmacy Patient Advocate Encounter  Received notification from [employer] that Prior Authorization for Trulicity  0.75MG /0.5ML auto-injectors has been APPROVED from 05/09/2024 to 05/09/2025. Ran test claim, Copay is $4. This test claim was processed through [facility]- copay amounts may vary at other pharmacies due to pharmacy/plan contracts, or as the patient moves through the different stages of their insurance plan.   PA #/Case ID/Reference #: [id number]

## 2024-06-21 ENCOUNTER — Ambulatory Visit: Admitting: Family Medicine
